# Patient Record
Sex: Female | Born: 1960
Health system: Southern US, Community
[De-identification: ages and names within clinical notes are randomized; demographics above are authoritative.]

## PROBLEM LIST (undated history)

## (undated) DIAGNOSIS — J449 Chronic obstructive pulmonary disease, unspecified: Secondary | ICD-10-CM

## (undated) DIAGNOSIS — E785 Hyperlipidemia, unspecified: Secondary | ICD-10-CM

## (undated) DIAGNOSIS — F419 Anxiety disorder, unspecified: Secondary | ICD-10-CM

## (undated) DIAGNOSIS — K2100 Gastro-esophageal reflux disease with esophagitis, without bleeding: Secondary | ICD-10-CM

## (undated) DIAGNOSIS — M542 Cervicalgia: Secondary | ICD-10-CM

## (undated) DIAGNOSIS — J309 Allergic rhinitis, unspecified: Secondary | ICD-10-CM

## (undated) DIAGNOSIS — R609 Edema, unspecified: Secondary | ICD-10-CM

## (undated) DIAGNOSIS — G8929 Other chronic pain: Secondary | ICD-10-CM

## (undated) DIAGNOSIS — K219 Gastro-esophageal reflux disease without esophagitis: Secondary | ICD-10-CM

## (undated) DIAGNOSIS — G4730 Sleep apnea, unspecified: Secondary | ICD-10-CM

## (undated) DIAGNOSIS — I10 Essential (primary) hypertension: Secondary | ICD-10-CM

## (undated) DIAGNOSIS — M810 Age-related osteoporosis without current pathological fracture: Secondary | ICD-10-CM

## (undated) DIAGNOSIS — M5020 Other cervical disc displacement, unspecified cervical region: Secondary | ICD-10-CM

## (undated) DIAGNOSIS — M545 Low back pain, unspecified: Secondary | ICD-10-CM

## (undated) DIAGNOSIS — J209 Acute bronchitis, unspecified: Secondary | ICD-10-CM

## (undated) DIAGNOSIS — J45909 Unspecified asthma, uncomplicated: Secondary | ICD-10-CM

## (undated) DIAGNOSIS — R079 Chest pain, unspecified: Secondary | ICD-10-CM

## (undated) DIAGNOSIS — L719 Rosacea, unspecified: Secondary | ICD-10-CM

## (undated) HISTORY — DX: Chronic obstructive pulmonary disease, unspecified: J44.9

## (undated) HISTORY — PX: TUBAL LIGATION: SHX77

## (undated) HISTORY — DX: Hyperlipidemia, unspecified: E78.5

## (undated) HISTORY — PX: ABDOMINAL HYSTERECTOMY: SHX81

## (undated) HISTORY — DX: Chest pain, unspecified: R07.9

## (undated) HISTORY — DX: Anxiety disorder, unspecified: F41.9

## (undated) HISTORY — PX: MOUTH SURGERY: SHX715

## (undated) HISTORY — DX: Acute bronchitis, unspecified: J20.9

## (undated) HISTORY — DX: Allergic rhinitis, unspecified: J30.9

## (undated) HISTORY — DX: Cervicalgia: M54.2

## (undated) HISTORY — DX: Unspecified asthma, uncomplicated: J45.909

## (undated) HISTORY — PX: CHOLECYSTECTOMY: SHX55

## (undated) HISTORY — DX: Other chronic pain: G89.29

## (undated) HISTORY — DX: Age-related osteoporosis without current pathological fracture: M81.0

## (undated) HISTORY — PX: EYE SURGERY: SHX253

## (undated) HISTORY — DX: Essential (primary) hypertension: I10

## (undated) HISTORY — DX: Rosacea, unspecified: L71.9

## (undated) HISTORY — DX: Gastro-esophageal reflux disease with esophagitis, without bleeding: K21.00

## (undated) HISTORY — DX: Low back pain, unspecified: M54.50

## (undated) HISTORY — DX: Sleep apnea, unspecified: G47.30

## (undated) HISTORY — DX: Edema, unspecified: R60.9

## (undated) HISTORY — DX: Other cervical disc displacement, unspecified cervical region: M50.20

---
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
## 2012-12-05 HISTORY — PX: COLONOSCOPY: SHX174

## 2013-01-02 ENCOUNTER — Emergency Department (HOSPITAL_COMMUNITY)
Admission: EM | Admit: 2013-01-02 | Discharge: 2013-01-02 | Disposition: A | Discharge status: Home / Self Care | Payer: BC Managed Care – PPO | Attending: Emergency Medicine | Admitting: Emergency Medicine

## 2013-01-02 ENCOUNTER — Encounter (HOSPITAL_COMMUNITY): Payer: Self-pay | Admitting: Emergency Medicine

## 2013-01-02 ENCOUNTER — Emergency Department (HOSPITAL_COMMUNITY): Payer: BC Managed Care – PPO

## 2013-01-02 DIAGNOSIS — J069 Acute upper respiratory infection, unspecified: Secondary | ICD-10-CM

## 2013-01-02 DIAGNOSIS — E669 Obesity, unspecified: Secondary | ICD-10-CM | POA: Insufficient documentation

## 2013-01-02 DIAGNOSIS — Z87891 Personal history of nicotine dependence: Secondary | ICD-10-CM | POA: Insufficient documentation

## 2013-01-02 DIAGNOSIS — IMO0002 Reserved for concepts with insufficient information to code with codable children: Secondary | ICD-10-CM | POA: Insufficient documentation

## 2013-01-02 DIAGNOSIS — Z792 Long term (current) use of antibiotics: Secondary | ICD-10-CM | POA: Insufficient documentation

## 2013-01-02 DIAGNOSIS — Z79899 Other long term (current) drug therapy: Secondary | ICD-10-CM | POA: Insufficient documentation

## 2013-01-02 MED ORDER — IPRATROPIUM BROMIDE 0.02 % IN SOLN
0.5000 mg | Freq: Once | RESPIRATORY_TRACT | Status: AC
  Administered 2013-01-02: 0.5 mg via RESPIRATORY_TRACT
  Filled 2013-01-02: qty 2.5

## 2013-01-02 MED ORDER — HYDROCOD POLST-CHLORPHEN POLST 10-8 MG/5ML PO LQCR: 5.0000 mL | Freq: Two times a day (BID) | ORAL | Status: DC | PRN

## 2013-01-02 MED ORDER — ALBUTEROL SULFATE (2.5 MG/3ML) 0.083% IN NEBU
5.0000 mg | INHALATION_SOLUTION | Freq: Once | RESPIRATORY_TRACT | Status: AC
  Administered 2013-01-02: 5 mg via RESPIRATORY_TRACT
  Filled 2013-01-02: qty 6

## 2013-01-02 MED ORDER — ACETAMINOPHEN 325 MG PO TABS
ORAL_TABLET | ORAL | Status: AC
  Administered 2013-01-02: 650 mg via ORAL
  Filled 2013-01-02: qty 2

## 2013-01-02 MED ORDER — ACETAMINOPHEN 325 MG PO TABS
650.0000 mg | ORAL_TABLET | Freq: Once | ORAL | Status: AC
  Administered 2013-01-02: 650 mg via ORAL

## 2013-01-02 MED ORDER — PREDNISONE 20 MG PO TABS: ORAL_TABLET | ORAL | Status: DC

## 2013-01-02 MED ORDER — PREDNISONE 50 MG PO TABS
60.0000 mg | ORAL_TABLET | Freq: Once | ORAL | Status: AC
  Administered 2013-01-02: 60 mg via ORAL
  Filled 2013-01-02 (×2): qty 1

## 2013-01-02 NOTE — ED Notes (Signed)
Patient given discharge instruction, verbalized understand. Patient ambulatory out of the department.  

## 2013-01-02 NOTE — ED Provider Notes (Signed)
CSN: [id number]     Arrival date & time 01/02/13  1828 History   First MD Initiated Contact with Patient 01/02/13 1842     Chief Complaint  Patient presents with  . Shortness of Breath   (Consider location/radiation/quality/duration/timing/severity/associated sxs/prior Treatment) HPI.... cough and wheezing for several days along with fever.  Patient seen by primary care doctor and prescribed Cipro and albuterol inhaler. She claims to be no better. Severity is mild to moderate. Nothing makes symptoms better or worse  History reviewed. No pertinent past medical history. Past Surgical History  Procedure Laterality Date  . Abdominal hysterectomy    . Tubal ligation    . Cholecystectomy    . Mouth surgery     No family history on file. History  Substance Use Topics  . Smoking status: Former Games developer  . Smokeless tobacco: Not on file  . Alcohol Use: No   OB History   Grav Para Term Preterm Abortions TAB SAB Ect Mult Living                 Review of Systems  All other systems reviewed and are negative.    Allergies  Review of patient's allergies indicates no known allergies.  Home Medications   Current Outpatient Rx  Name  Route  Sig  Dispense  Refill  . albuterol (PROVENTIL) (2.5 MG/3ML) 0.083% nebulizer solution   Inhalation   Inhale 3 mLs into the lungs every 6 (six) hours as needed. For shortness of breath         . alendronate (FOSAMAX) 70 MG tablet   Oral   Take 1 tablet by mouth once a week.         . Ascorbic Acid (VITAMIN C PO)   Oral   Take 1 tablet by mouth daily.         . ciprofloxacin (CIPRO) 500 MG tablet   Oral   Take 500 mg by mouth 2 (two) times daily. 7 day course starting on 01/01/2013         . Cyanocobalamin (B-12 PO)   Oral   Take 1 tablet by mouth daily.         . cyclobenzaprine (FLEXERIL) 10 MG tablet   Oral   Take 10 mg by mouth 3 (three) times daily as needed. For muscle spasms         . fluticasone (FLONASE) 50 MCG/ACT  nasal spray   Each Nare   Place 2 sprays into both nostrils.         Marland Kitchen HYDROcodone-acetaminophen (NORCO/VICODIN) 5-325 MG per tablet   Oral   Take 1 tablet by mouth every 6 (six) hours as needed. For pain         . HYDROcodone-homatropine (HYCODAN) 5-1.5 MG/5ML syrup   Oral   Take 5 mLs by mouth every 6 (six) hours as needed. For cough and/or pain         . LORazepam (ATIVAN) 1 MG tablet   Oral   Take 1 mg by mouth 2 (two) times daily as needed. For anxiety         . MAGNESIUM CARBONATE PO   Oral   Take 1 tablet by mouth daily.         . Multiple Vitamin (MULTIVITAMIN WITH MINERALS) TABS tablet   Oral   Take 1 tablet by mouth daily.         . traMADol (ULTRAM) 50 MG tablet   Oral   Take 50-100 mg by mouth  every 6 (six) hours as needed.         . chlorpheniramine-HYDROcodone (TUSSIONEX PENNKINETIC ER) 10-8 MG/5ML LQCR   Oral   Take 5 mLs by mouth every 12 (twelve) hours as needed for cough.   120 mL   0   . predniSONE (DELTASONE) 20 MG tablet      3 tabs po day one, then 2 po daily x 4 days   11 tablet   0    BP 153/73  Pulse 97  Temp(Src) 101.1 F (38.4 C) (Oral)  Resp 20  Ht 5\' 1"  (1.549 m)  Wt 303 lb (137.44 kg)  BMI 57.28 kg/m2  SpO2 99% Physical Exam  Nursing note and vitals reviewed. Constitutional: She is oriented to person, place, and time.  Febrile, obese  HENT:  Head: Normocephalic and atraumatic.  Eyes: Conjunctivae and EOM are normal. Pupils are equal, round, and reactive to light.  Neck: Normal range of motion. Neck supple.  Cardiovascular: Normal rate, regular rhythm and normal heart sounds.   Pulmonary/Chest: Effort normal.  Minimal expiratory wheeze  Abdominal: Soft. Bowel sounds are normal.  Musculoskeletal: Normal range of motion.  Neurological: She is alert and oriented to person, place, and time.  Skin: Skin is warm and dry.  Psychiatric: She has a normal mood and affect. Her behavior is normal.    ED Course   Procedures (including critical care time) Labs Review Labs Reviewed - No data to display Imaging Review Dg Chest 2 View  01/02/2013   CLINICAL DATA:  Cough, congestion and fever  EXAM: CHEST  2 VIEW  COMPARISON:  None.  FINDINGS: The heart size and mediastinal contours are within normal limits. Both lungs are clear. The visualized skeletal structures are unremarkable.  IMPRESSION: No active cardiopulmonary disease.   Electronically Signed   By: Elige Ko   On: 01/02/2013 19:22    EKG Interpretation   None       MDM   1. URI (upper respiratory infection)    Patient is in no acute distress. Chest x-ray shows no pneumonia. Most likely this is a viral illness.  Rx prednisone and Tussionex cough syrup    Donnetta Hutching, MD 01/02/13 2105

## 2013-01-02 NOTE — ED Notes (Addendum)
Pt. Reports SOB starting yesterday. Pt. Reports increased SOB today. Pt. Reports cough and nasal congestion. Pt. In no acute distress.

## 2013-01-02 NOTE — ED Notes (Signed)
Patient states that head and cheat hurts due to coughing.

## 2015-08-13 DIAGNOSIS — Z7189 Other specified counseling: Secondary | ICD-10-CM | POA: Diagnosis not present

## 2015-08-13 DIAGNOSIS — Z0000 Encounter for general adult medical examination without abnormal findings: Secondary | ICD-10-CM | POA: Diagnosis not present

## 2015-08-13 DIAGNOSIS — Z1211 Encounter for screening for malignant neoplasm of colon: Secondary | ICD-10-CM | POA: Diagnosis not present

## 2015-08-13 DIAGNOSIS — Z1389 Encounter for screening for other disorder: Secondary | ICD-10-CM | POA: Diagnosis not present

## 2015-08-13 DIAGNOSIS — E7800 Pure hypercholesterolemia, unspecified: Secondary | ICD-10-CM | POA: Diagnosis not present

## 2015-08-13 DIAGNOSIS — Z79899 Other long term (current) drug therapy: Secondary | ICD-10-CM | POA: Diagnosis not present

## 2015-08-13 DIAGNOSIS — R5383 Other fatigue: Secondary | ICD-10-CM | POA: Diagnosis not present

## 2015-08-13 DIAGNOSIS — Z299 Encounter for prophylactic measures, unspecified: Secondary | ICD-10-CM | POA: Diagnosis not present

## 2015-09-26 DIAGNOSIS — M549 Dorsalgia, unspecified: Secondary | ICD-10-CM | POA: Diagnosis not present

## 2015-09-26 DIAGNOSIS — Z6841 Body Mass Index (BMI) 40.0 and over, adult: Secondary | ICD-10-CM | POA: Diagnosis not present

## 2015-09-26 DIAGNOSIS — J45909 Unspecified asthma, uncomplicated: Secondary | ICD-10-CM | POA: Diagnosis not present

## 2015-10-27 DIAGNOSIS — M549 Dorsalgia, unspecified: Secondary | ICD-10-CM | POA: Diagnosis not present

## 2015-10-27 DIAGNOSIS — Z299 Encounter for prophylactic measures, unspecified: Secondary | ICD-10-CM | POA: Diagnosis not present

## 2015-10-27 DIAGNOSIS — Z6841 Body Mass Index (BMI) 40.0 and over, adult: Secondary | ICD-10-CM | POA: Diagnosis not present

## 2015-10-27 DIAGNOSIS — J439 Emphysema, unspecified: Secondary | ICD-10-CM | POA: Diagnosis not present

## 2015-10-27 DIAGNOSIS — J441 Chronic obstructive pulmonary disease with (acute) exacerbation: Secondary | ICD-10-CM | POA: Diagnosis not present

## 2015-11-24 DIAGNOSIS — G4733 Obstructive sleep apnea (adult) (pediatric): Secondary | ICD-10-CM | POA: Diagnosis not present

## 2015-11-24 DIAGNOSIS — H40053 Ocular hypertension, bilateral: Secondary | ICD-10-CM | POA: Diagnosis not present

## 2015-12-01 DIAGNOSIS — G4733 Obstructive sleep apnea (adult) (pediatric): Secondary | ICD-10-CM | POA: Diagnosis not present

## 2015-12-01 DIAGNOSIS — Z299 Encounter for prophylactic measures, unspecified: Secondary | ICD-10-CM | POA: Diagnosis not present

## 2015-12-01 DIAGNOSIS — Z713 Dietary counseling and surveillance: Secondary | ICD-10-CM | POA: Diagnosis not present

## 2015-12-01 DIAGNOSIS — Z6841 Body Mass Index (BMI) 40.0 and over, adult: Secondary | ICD-10-CM | POA: Diagnosis not present

## 2015-12-01 DIAGNOSIS — M2550 Pain in unspecified joint: Secondary | ICD-10-CM | POA: Diagnosis not present

## 2015-12-01 DIAGNOSIS — J439 Emphysema, unspecified: Secondary | ICD-10-CM | POA: Diagnosis not present

## 2015-12-30 DIAGNOSIS — J439 Emphysema, unspecified: Secondary | ICD-10-CM | POA: Diagnosis not present

## 2015-12-30 DIAGNOSIS — Z6841 Body Mass Index (BMI) 40.0 and over, adult: Secondary | ICD-10-CM | POA: Diagnosis not present

## 2015-12-30 DIAGNOSIS — M549 Dorsalgia, unspecified: Secondary | ICD-10-CM | POA: Diagnosis not present

## 2015-12-30 DIAGNOSIS — Z299 Encounter for prophylactic measures, unspecified: Secondary | ICD-10-CM | POA: Diagnosis not present

## 2015-12-30 DIAGNOSIS — Z789 Other specified health status: Secondary | ICD-10-CM | POA: Diagnosis not present

## 2016-01-23 DIAGNOSIS — R509 Fever, unspecified: Secondary | ICD-10-CM | POA: Diagnosis not present

## 2016-01-23 DIAGNOSIS — J441 Chronic obstructive pulmonary disease with (acute) exacerbation: Secondary | ICD-10-CM | POA: Diagnosis not present

## 2016-01-23 DIAGNOSIS — R110 Nausea: Secondary | ICD-10-CM | POA: Diagnosis not present

## 2016-02-03 DIAGNOSIS — M549 Dorsalgia, unspecified: Secondary | ICD-10-CM | POA: Diagnosis not present

## 2016-02-03 DIAGNOSIS — J439 Emphysema, unspecified: Secondary | ICD-10-CM | POA: Diagnosis not present

## 2016-02-03 DIAGNOSIS — Z713 Dietary counseling and surveillance: Secondary | ICD-10-CM | POA: Diagnosis not present

## 2016-02-03 DIAGNOSIS — Z79899 Other long term (current) drug therapy: Secondary | ICD-10-CM | POA: Diagnosis not present

## 2016-02-03 DIAGNOSIS — J440 Chronic obstructive pulmonary disease with acute lower respiratory infection: Secondary | ICD-10-CM | POA: Diagnosis not present

## 2016-02-03 DIAGNOSIS — J209 Acute bronchitis, unspecified: Secondary | ICD-10-CM | POA: Diagnosis not present

## 2016-02-03 DIAGNOSIS — Z299 Encounter for prophylactic measures, unspecified: Secondary | ICD-10-CM | POA: Diagnosis not present

## 2016-02-03 DIAGNOSIS — Z87891 Personal history of nicotine dependence: Secondary | ICD-10-CM | POA: Diagnosis not present

## 2016-02-03 DIAGNOSIS — Z6841 Body Mass Index (BMI) 40.0 and over, adult: Secondary | ICD-10-CM | POA: Diagnosis not present

## 2016-03-03 DIAGNOSIS — G4733 Obstructive sleep apnea (adult) (pediatric): Secondary | ICD-10-CM | POA: Diagnosis not present

## 2016-03-08 DIAGNOSIS — G4733 Obstructive sleep apnea (adult) (pediatric): Secondary | ICD-10-CM | POA: Diagnosis not present

## 2016-03-11 DIAGNOSIS — Z713 Dietary counseling and surveillance: Secondary | ICD-10-CM | POA: Diagnosis not present

## 2016-03-11 DIAGNOSIS — Z79899 Other long term (current) drug therapy: Secondary | ICD-10-CM | POA: Diagnosis not present

## 2016-03-11 DIAGNOSIS — J441 Chronic obstructive pulmonary disease with (acute) exacerbation: Secondary | ICD-10-CM | POA: Diagnosis not present

## 2016-03-11 DIAGNOSIS — J439 Emphysema, unspecified: Secondary | ICD-10-CM | POA: Diagnosis not present

## 2016-03-11 DIAGNOSIS — M549 Dorsalgia, unspecified: Secondary | ICD-10-CM | POA: Diagnosis not present

## 2016-03-11 DIAGNOSIS — Z87891 Personal history of nicotine dependence: Secondary | ICD-10-CM | POA: Diagnosis not present

## 2016-03-11 DIAGNOSIS — Z299 Encounter for prophylactic measures, unspecified: Secondary | ICD-10-CM | POA: Diagnosis not present

## 2016-03-11 DIAGNOSIS — Z6841 Body Mass Index (BMI) 40.0 and over, adult: Secondary | ICD-10-CM | POA: Diagnosis not present

## 2016-03-24 DIAGNOSIS — H40053 Ocular hypertension, bilateral: Secondary | ICD-10-CM | POA: Diagnosis not present

## 2016-04-08 DIAGNOSIS — G4733 Obstructive sleep apnea (adult) (pediatric): Secondary | ICD-10-CM | POA: Diagnosis not present

## 2016-04-13 DIAGNOSIS — I8390 Asymptomatic varicose veins of unspecified lower extremity: Secondary | ICD-10-CM | POA: Diagnosis not present

## 2016-04-13 DIAGNOSIS — Z79899 Other long term (current) drug therapy: Secondary | ICD-10-CM | POA: Diagnosis not present

## 2016-04-13 DIAGNOSIS — F419 Anxiety disorder, unspecified: Secondary | ICD-10-CM | POA: Diagnosis not present

## 2016-04-13 DIAGNOSIS — J441 Chronic obstructive pulmonary disease with (acute) exacerbation: Secondary | ICD-10-CM | POA: Diagnosis not present

## 2016-04-13 DIAGNOSIS — K210 Gastro-esophageal reflux disease with esophagitis: Secondary | ICD-10-CM | POA: Diagnosis not present

## 2016-04-13 DIAGNOSIS — Z6841 Body Mass Index (BMI) 40.0 and over, adult: Secondary | ICD-10-CM | POA: Diagnosis not present

## 2016-04-13 DIAGNOSIS — F329 Major depressive disorder, single episode, unspecified: Secondary | ICD-10-CM | POA: Diagnosis not present

## 2016-04-13 DIAGNOSIS — Z1389 Encounter for screening for other disorder: Secondary | ICD-10-CM | POA: Diagnosis not present

## 2016-04-13 DIAGNOSIS — Z299 Encounter for prophylactic measures, unspecified: Secondary | ICD-10-CM | POA: Diagnosis not present

## 2016-04-13 DIAGNOSIS — M549 Dorsalgia, unspecified: Secondary | ICD-10-CM | POA: Diagnosis not present

## 2016-04-13 DIAGNOSIS — G4730 Sleep apnea, unspecified: Secondary | ICD-10-CM | POA: Diagnosis not present

## 2016-04-30 DIAGNOSIS — G4733 Obstructive sleep apnea (adult) (pediatric): Secondary | ICD-10-CM | POA: Diagnosis not present

## 2016-05-08 DIAGNOSIS — G4733 Obstructive sleep apnea (adult) (pediatric): Secondary | ICD-10-CM | POA: Diagnosis not present

## 2016-05-12 DIAGNOSIS — F419 Anxiety disorder, unspecified: Secondary | ICD-10-CM | POA: Diagnosis not present

## 2016-05-12 DIAGNOSIS — Z713 Dietary counseling and surveillance: Secondary | ICD-10-CM | POA: Diagnosis not present

## 2016-05-12 DIAGNOSIS — Z79899 Other long term (current) drug therapy: Secondary | ICD-10-CM | POA: Diagnosis not present

## 2016-05-12 DIAGNOSIS — J439 Emphysema, unspecified: Secondary | ICD-10-CM | POA: Diagnosis not present

## 2016-05-12 DIAGNOSIS — M549 Dorsalgia, unspecified: Secondary | ICD-10-CM | POA: Diagnosis not present

## 2016-05-12 DIAGNOSIS — Z299 Encounter for prophylactic measures, unspecified: Secondary | ICD-10-CM | POA: Diagnosis not present

## 2016-05-12 DIAGNOSIS — Z6841 Body Mass Index (BMI) 40.0 and over, adult: Secondary | ICD-10-CM | POA: Diagnosis not present

## 2016-05-12 DIAGNOSIS — E7800 Pure hypercholesterolemia, unspecified: Secondary | ICD-10-CM | POA: Diagnosis not present

## 2016-05-27 DIAGNOSIS — L259 Unspecified contact dermatitis, unspecified cause: Secondary | ICD-10-CM | POA: Diagnosis not present

## 2016-05-27 DIAGNOSIS — J439 Emphysema, unspecified: Secondary | ICD-10-CM | POA: Diagnosis not present

## 2016-05-27 DIAGNOSIS — Z299 Encounter for prophylactic measures, unspecified: Secondary | ICD-10-CM | POA: Diagnosis not present

## 2016-05-27 DIAGNOSIS — Z6841 Body Mass Index (BMI) 40.0 and over, adult: Secondary | ICD-10-CM | POA: Diagnosis not present

## 2016-05-27 DIAGNOSIS — Z87891 Personal history of nicotine dependence: Secondary | ICD-10-CM | POA: Diagnosis not present

## 2016-06-08 DIAGNOSIS — G4733 Obstructive sleep apnea (adult) (pediatric): Secondary | ICD-10-CM | POA: Diagnosis not present

## 2016-06-14 DIAGNOSIS — E7800 Pure hypercholesterolemia, unspecified: Secondary | ICD-10-CM | POA: Diagnosis not present

## 2016-06-14 DIAGNOSIS — Z79899 Other long term (current) drug therapy: Secondary | ICD-10-CM | POA: Diagnosis not present

## 2016-06-14 DIAGNOSIS — M549 Dorsalgia, unspecified: Secondary | ICD-10-CM | POA: Diagnosis not present

## 2016-06-14 DIAGNOSIS — G4730 Sleep apnea, unspecified: Secondary | ICD-10-CM | POA: Diagnosis not present

## 2016-06-14 DIAGNOSIS — F419 Anxiety disorder, unspecified: Secondary | ICD-10-CM | POA: Diagnosis not present

## 2016-06-14 DIAGNOSIS — I8390 Asymptomatic varicose veins of unspecified lower extremity: Secondary | ICD-10-CM | POA: Diagnosis not present

## 2016-06-14 DIAGNOSIS — Z299 Encounter for prophylactic measures, unspecified: Secondary | ICD-10-CM | POA: Diagnosis not present

## 2016-06-14 DIAGNOSIS — J439 Emphysema, unspecified: Secondary | ICD-10-CM | POA: Diagnosis not present

## 2016-06-14 DIAGNOSIS — Z6841 Body Mass Index (BMI) 40.0 and over, adult: Secondary | ICD-10-CM | POA: Diagnosis not present

## 2016-07-08 DIAGNOSIS — G4733 Obstructive sleep apnea (adult) (pediatric): Secondary | ICD-10-CM | POA: Diagnosis not present

## 2016-07-12 DIAGNOSIS — I8390 Asymptomatic varicose veins of unspecified lower extremity: Secondary | ICD-10-CM | POA: Diagnosis not present

## 2016-07-12 DIAGNOSIS — E7800 Pure hypercholesterolemia, unspecified: Secondary | ICD-10-CM | POA: Diagnosis not present

## 2016-07-12 DIAGNOSIS — Z79899 Other long term (current) drug therapy: Secondary | ICD-10-CM | POA: Diagnosis not present

## 2016-07-12 DIAGNOSIS — M2550 Pain in unspecified joint: Secondary | ICD-10-CM | POA: Diagnosis not present

## 2016-07-12 DIAGNOSIS — F329 Major depressive disorder, single episode, unspecified: Secondary | ICD-10-CM | POA: Diagnosis not present

## 2016-07-12 DIAGNOSIS — J439 Emphysema, unspecified: Secondary | ICD-10-CM | POA: Diagnosis not present

## 2016-07-12 DIAGNOSIS — Z299 Encounter for prophylactic measures, unspecified: Secondary | ICD-10-CM | POA: Diagnosis not present

## 2016-07-12 DIAGNOSIS — F419 Anxiety disorder, unspecified: Secondary | ICD-10-CM | POA: Diagnosis not present

## 2016-07-12 DIAGNOSIS — Z6841 Body Mass Index (BMI) 40.0 and over, adult: Secondary | ICD-10-CM | POA: Diagnosis not present

## 2016-07-12 DIAGNOSIS — G4730 Sleep apnea, unspecified: Secondary | ICD-10-CM | POA: Diagnosis not present

## 2016-07-14 DIAGNOSIS — M7731 Calcaneal spur, right foot: Secondary | ICD-10-CM | POA: Diagnosis not present

## 2016-07-14 DIAGNOSIS — S99921A Unspecified injury of right foot, initial encounter: Secondary | ICD-10-CM | POA: Diagnosis not present

## 2016-07-14 DIAGNOSIS — M79671 Pain in right foot: Secondary | ICD-10-CM | POA: Diagnosis not present

## 2016-07-14 DIAGNOSIS — M7751 Other enthesopathy of right foot: Secondary | ICD-10-CM | POA: Diagnosis not present

## 2016-08-08 DIAGNOSIS — G4733 Obstructive sleep apnea (adult) (pediatric): Secondary | ICD-10-CM | POA: Diagnosis not present

## 2016-08-12 DIAGNOSIS — Z1231 Encounter for screening mammogram for malignant neoplasm of breast: Secondary | ICD-10-CM | POA: Diagnosis not present

## 2016-08-13 DIAGNOSIS — Z7189 Other specified counseling: Secondary | ICD-10-CM | POA: Diagnosis not present

## 2016-08-13 DIAGNOSIS — M545 Low back pain: Secondary | ICD-10-CM | POA: Diagnosis not present

## 2016-08-13 DIAGNOSIS — G4730 Sleep apnea, unspecified: Secondary | ICD-10-CM | POA: Diagnosis not present

## 2016-08-13 DIAGNOSIS — R5383 Other fatigue: Secondary | ICD-10-CM | POA: Diagnosis not present

## 2016-08-13 DIAGNOSIS — F329 Major depressive disorder, single episode, unspecified: Secondary | ICD-10-CM | POA: Diagnosis not present

## 2016-08-13 DIAGNOSIS — F419 Anxiety disorder, unspecified: Secondary | ICD-10-CM | POA: Diagnosis not present

## 2016-08-13 DIAGNOSIS — Z0000 Encounter for general adult medical examination without abnormal findings: Secondary | ICD-10-CM | POA: Diagnosis not present

## 2016-08-13 DIAGNOSIS — I8390 Asymptomatic varicose veins of unspecified lower extremity: Secondary | ICD-10-CM | POA: Diagnosis not present

## 2016-08-13 DIAGNOSIS — Z299 Encounter for prophylactic measures, unspecified: Secondary | ICD-10-CM | POA: Diagnosis not present

## 2016-08-13 DIAGNOSIS — Z1211 Encounter for screening for malignant neoplasm of colon: Secondary | ICD-10-CM | POA: Diagnosis not present

## 2016-08-13 DIAGNOSIS — Z79899 Other long term (current) drug therapy: Secondary | ICD-10-CM | POA: Diagnosis not present

## 2016-08-13 DIAGNOSIS — Z6841 Body Mass Index (BMI) 40.0 and over, adult: Secondary | ICD-10-CM | POA: Diagnosis not present

## 2016-08-13 DIAGNOSIS — Z1389 Encounter for screening for other disorder: Secondary | ICD-10-CM | POA: Diagnosis not present

## 2016-08-13 DIAGNOSIS — E7800 Pure hypercholesterolemia, unspecified: Secondary | ICD-10-CM | POA: Diagnosis not present

## 2016-09-08 DIAGNOSIS — G4733 Obstructive sleep apnea (adult) (pediatric): Secondary | ICD-10-CM | POA: Diagnosis not present

## 2016-09-09 DIAGNOSIS — E2839 Other primary ovarian failure: Secondary | ICD-10-CM | POA: Diagnosis not present

## 2016-09-10 DIAGNOSIS — J439 Emphysema, unspecified: Secondary | ICD-10-CM | POA: Diagnosis not present

## 2016-09-10 DIAGNOSIS — Z79899 Other long term (current) drug therapy: Secondary | ICD-10-CM | POA: Diagnosis not present

## 2016-09-10 DIAGNOSIS — F329 Major depressive disorder, single episode, unspecified: Secondary | ICD-10-CM | POA: Diagnosis not present

## 2016-09-10 DIAGNOSIS — Z6841 Body Mass Index (BMI) 40.0 and over, adult: Secondary | ICD-10-CM | POA: Diagnosis not present

## 2016-09-10 DIAGNOSIS — F419 Anxiety disorder, unspecified: Secondary | ICD-10-CM | POA: Diagnosis not present

## 2016-09-10 DIAGNOSIS — Z299 Encounter for prophylactic measures, unspecified: Secondary | ICD-10-CM | POA: Diagnosis not present

## 2016-09-10 DIAGNOSIS — M79609 Pain in unspecified limb: Secondary | ICD-10-CM | POA: Diagnosis not present

## 2016-09-10 DIAGNOSIS — E7800 Pure hypercholesterolemia, unspecified: Secondary | ICD-10-CM | POA: Diagnosis not present

## 2016-09-10 DIAGNOSIS — G4730 Sleep apnea, unspecified: Secondary | ICD-10-CM | POA: Diagnosis not present

## 2016-09-10 DIAGNOSIS — Z87891 Personal history of nicotine dependence: Secondary | ICD-10-CM | POA: Diagnosis not present

## 2016-09-10 DIAGNOSIS — M2550 Pain in unspecified joint: Secondary | ICD-10-CM | POA: Diagnosis not present

## 2016-09-28 DIAGNOSIS — H40053 Ocular hypertension, bilateral: Secondary | ICD-10-CM | POA: Diagnosis not present

## 2016-10-08 DIAGNOSIS — G4733 Obstructive sleep apnea (adult) (pediatric): Secondary | ICD-10-CM | POA: Diagnosis not present

## 2016-10-11 DIAGNOSIS — J439 Emphysema, unspecified: Secondary | ICD-10-CM | POA: Diagnosis not present

## 2016-10-11 DIAGNOSIS — M549 Dorsalgia, unspecified: Secondary | ICD-10-CM | POA: Diagnosis not present

## 2016-10-11 DIAGNOSIS — J440 Chronic obstructive pulmonary disease with acute lower respiratory infection: Secondary | ICD-10-CM | POA: Diagnosis not present

## 2016-10-11 DIAGNOSIS — Z299 Encounter for prophylactic measures, unspecified: Secondary | ICD-10-CM | POA: Diagnosis not present

## 2016-10-11 DIAGNOSIS — Z6841 Body Mass Index (BMI) 40.0 and over, adult: Secondary | ICD-10-CM | POA: Diagnosis not present

## 2016-10-11 DIAGNOSIS — J209 Acute bronchitis, unspecified: Secondary | ICD-10-CM | POA: Diagnosis not present

## 2016-10-11 DIAGNOSIS — Z79899 Other long term (current) drug therapy: Secondary | ICD-10-CM | POA: Diagnosis not present

## 2016-10-27 DIAGNOSIS — Z299 Encounter for prophylactic measures, unspecified: Secondary | ICD-10-CM | POA: Diagnosis not present

## 2016-10-27 DIAGNOSIS — J439 Emphysema, unspecified: Secondary | ICD-10-CM | POA: Diagnosis not present

## 2016-10-27 DIAGNOSIS — G4733 Obstructive sleep apnea (adult) (pediatric): Secondary | ICD-10-CM | POA: Diagnosis not present

## 2016-10-27 DIAGNOSIS — G4730 Sleep apnea, unspecified: Secondary | ICD-10-CM | POA: Diagnosis not present

## 2016-10-27 DIAGNOSIS — M549 Dorsalgia, unspecified: Secondary | ICD-10-CM | POA: Diagnosis not present

## 2016-10-27 DIAGNOSIS — I8390 Asymptomatic varicose veins of unspecified lower extremity: Secondary | ICD-10-CM | POA: Diagnosis not present

## 2016-10-27 DIAGNOSIS — E7800 Pure hypercholesterolemia, unspecified: Secondary | ICD-10-CM | POA: Diagnosis not present

## 2016-10-27 DIAGNOSIS — J441 Chronic obstructive pulmonary disease with (acute) exacerbation: Secondary | ICD-10-CM | POA: Diagnosis not present

## 2016-10-27 DIAGNOSIS — Z6841 Body Mass Index (BMI) 40.0 and over, adult: Secondary | ICD-10-CM | POA: Diagnosis not present

## 2016-10-27 DIAGNOSIS — F329 Major depressive disorder, single episode, unspecified: Secondary | ICD-10-CM | POA: Diagnosis not present

## 2016-10-27 DIAGNOSIS — F419 Anxiety disorder, unspecified: Secondary | ICD-10-CM | POA: Diagnosis not present

## 2016-11-08 DIAGNOSIS — G4733 Obstructive sleep apnea (adult) (pediatric): Secondary | ICD-10-CM | POA: Diagnosis not present

## 2016-11-11 DIAGNOSIS — E7800 Pure hypercholesterolemia, unspecified: Secondary | ICD-10-CM | POA: Diagnosis not present

## 2016-11-11 DIAGNOSIS — Z79899 Other long term (current) drug therapy: Secondary | ICD-10-CM | POA: Diagnosis not present

## 2016-11-11 DIAGNOSIS — Z299 Encounter for prophylactic measures, unspecified: Secondary | ICD-10-CM | POA: Diagnosis not present

## 2016-11-11 DIAGNOSIS — M545 Low back pain: Secondary | ICD-10-CM | POA: Diagnosis not present

## 2016-11-11 DIAGNOSIS — B379 Candidiasis, unspecified: Secondary | ICD-10-CM | POA: Diagnosis not present

## 2016-11-11 DIAGNOSIS — J439 Emphysema, unspecified: Secondary | ICD-10-CM | POA: Diagnosis not present

## 2016-11-11 DIAGNOSIS — Z6841 Body Mass Index (BMI) 40.0 and over, adult: Secondary | ICD-10-CM | POA: Diagnosis not present

## 2016-12-08 DIAGNOSIS — G4733 Obstructive sleep apnea (adult) (pediatric): Secondary | ICD-10-CM | POA: Diagnosis not present

## 2016-12-10 DIAGNOSIS — I8390 Asymptomatic varicose veins of unspecified lower extremity: Secondary | ICD-10-CM | POA: Diagnosis not present

## 2016-12-10 DIAGNOSIS — J439 Emphysema, unspecified: Secondary | ICD-10-CM | POA: Diagnosis not present

## 2016-12-10 DIAGNOSIS — F329 Major depressive disorder, single episode, unspecified: Secondary | ICD-10-CM | POA: Diagnosis not present

## 2016-12-10 DIAGNOSIS — Z87891 Personal history of nicotine dependence: Secondary | ICD-10-CM | POA: Diagnosis not present

## 2016-12-10 DIAGNOSIS — Z6841 Body Mass Index (BMI) 40.0 and over, adult: Secondary | ICD-10-CM | POA: Diagnosis not present

## 2016-12-10 DIAGNOSIS — M545 Low back pain: Secondary | ICD-10-CM | POA: Diagnosis not present

## 2016-12-10 DIAGNOSIS — Z79899 Other long term (current) drug therapy: Secondary | ICD-10-CM | POA: Diagnosis not present

## 2016-12-10 DIAGNOSIS — Z299 Encounter for prophylactic measures, unspecified: Secondary | ICD-10-CM | POA: Diagnosis not present

## 2016-12-10 DIAGNOSIS — G4730 Sleep apnea, unspecified: Secondary | ICD-10-CM | POA: Diagnosis not present

## 2017-01-08 DIAGNOSIS — G4733 Obstructive sleep apnea (adult) (pediatric): Secondary | ICD-10-CM | POA: Diagnosis not present

## 2017-01-10 DIAGNOSIS — M549 Dorsalgia, unspecified: Secondary | ICD-10-CM | POA: Diagnosis not present

## 2017-01-10 DIAGNOSIS — Z87891 Personal history of nicotine dependence: Secondary | ICD-10-CM | POA: Diagnosis not present

## 2017-01-10 DIAGNOSIS — Z6841 Body Mass Index (BMI) 40.0 and over, adult: Secondary | ICD-10-CM | POA: Diagnosis not present

## 2017-01-10 DIAGNOSIS — J441 Chronic obstructive pulmonary disease with (acute) exacerbation: Secondary | ICD-10-CM | POA: Diagnosis not present

## 2017-01-10 DIAGNOSIS — J439 Emphysema, unspecified: Secondary | ICD-10-CM | POA: Diagnosis not present

## 2017-01-10 DIAGNOSIS — Z79899 Other long term (current) drug therapy: Secondary | ICD-10-CM | POA: Diagnosis not present

## 2017-01-10 DIAGNOSIS — Z299 Encounter for prophylactic measures, unspecified: Secondary | ICD-10-CM | POA: Diagnosis not present

## 2017-01-10 DIAGNOSIS — G4730 Sleep apnea, unspecified: Secondary | ICD-10-CM | POA: Diagnosis not present

## 2017-01-10 DIAGNOSIS — I8390 Asymptomatic varicose veins of unspecified lower extremity: Secondary | ICD-10-CM | POA: Diagnosis not present

## 2017-02-10 DIAGNOSIS — I8390 Asymptomatic varicose veins of unspecified lower extremity: Secondary | ICD-10-CM | POA: Diagnosis not present

## 2017-02-10 DIAGNOSIS — Z6841 Body Mass Index (BMI) 40.0 and over, adult: Secondary | ICD-10-CM | POA: Diagnosis not present

## 2017-02-10 DIAGNOSIS — G4730 Sleep apnea, unspecified: Secondary | ICD-10-CM | POA: Diagnosis not present

## 2017-02-10 DIAGNOSIS — E7800 Pure hypercholesterolemia, unspecified: Secondary | ICD-10-CM | POA: Diagnosis not present

## 2017-02-10 DIAGNOSIS — Z79899 Other long term (current) drug therapy: Secondary | ICD-10-CM | POA: Diagnosis not present

## 2017-02-10 DIAGNOSIS — M549 Dorsalgia, unspecified: Secondary | ICD-10-CM | POA: Diagnosis not present

## 2017-02-10 DIAGNOSIS — G4733 Obstructive sleep apnea (adult) (pediatric): Secondary | ICD-10-CM | POA: Diagnosis not present

## 2017-02-10 DIAGNOSIS — Z299 Encounter for prophylactic measures, unspecified: Secondary | ICD-10-CM | POA: Diagnosis not present

## 2017-02-10 DIAGNOSIS — J439 Emphysema, unspecified: Secondary | ICD-10-CM | POA: Diagnosis not present

## 2017-03-08 DIAGNOSIS — G4733 Obstructive sleep apnea (adult) (pediatric): Secondary | ICD-10-CM | POA: Diagnosis not present

## 2017-03-10 DIAGNOSIS — G4730 Sleep apnea, unspecified: Secondary | ICD-10-CM | POA: Diagnosis not present

## 2017-03-10 DIAGNOSIS — J439 Emphysema, unspecified: Secondary | ICD-10-CM | POA: Diagnosis not present

## 2017-03-10 DIAGNOSIS — F419 Anxiety disorder, unspecified: Secondary | ICD-10-CM | POA: Diagnosis not present

## 2017-03-10 DIAGNOSIS — I8390 Asymptomatic varicose veins of unspecified lower extremity: Secondary | ICD-10-CM | POA: Diagnosis not present

## 2017-03-10 DIAGNOSIS — M549 Dorsalgia, unspecified: Secondary | ICD-10-CM | POA: Diagnosis not present

## 2017-03-10 DIAGNOSIS — Z79899 Other long term (current) drug therapy: Secondary | ICD-10-CM | POA: Diagnosis not present

## 2017-03-10 DIAGNOSIS — E7800 Pure hypercholesterolemia, unspecified: Secondary | ICD-10-CM | POA: Diagnosis not present

## 2017-03-10 DIAGNOSIS — Z299 Encounter for prophylactic measures, unspecified: Secondary | ICD-10-CM | POA: Diagnosis not present

## 2017-03-11 DIAGNOSIS — G4733 Obstructive sleep apnea (adult) (pediatric): Secondary | ICD-10-CM | POA: Diagnosis not present

## 2017-04-08 DIAGNOSIS — H40053 Ocular hypertension, bilateral: Secondary | ICD-10-CM | POA: Diagnosis not present

## 2017-04-11 DIAGNOSIS — Z79899 Other long term (current) drug therapy: Secondary | ICD-10-CM | POA: Diagnosis not present

## 2017-04-11 DIAGNOSIS — Z6841 Body Mass Index (BMI) 40.0 and over, adult: Secondary | ICD-10-CM | POA: Diagnosis not present

## 2017-04-11 DIAGNOSIS — G4730 Sleep apnea, unspecified: Secondary | ICD-10-CM | POA: Diagnosis not present

## 2017-04-11 DIAGNOSIS — Z299 Encounter for prophylactic measures, unspecified: Secondary | ICD-10-CM | POA: Diagnosis not present

## 2017-04-11 DIAGNOSIS — Z87891 Personal history of nicotine dependence: Secondary | ICD-10-CM | POA: Diagnosis not present

## 2017-04-11 DIAGNOSIS — M545 Low back pain: Secondary | ICD-10-CM | POA: Diagnosis not present

## 2017-04-11 DIAGNOSIS — J439 Emphysema, unspecified: Secondary | ICD-10-CM | POA: Diagnosis not present

## 2017-04-29 DIAGNOSIS — Z6841 Body Mass Index (BMI) 40.0 and over, adult: Secondary | ICD-10-CM | POA: Diagnosis not present

## 2017-04-29 DIAGNOSIS — Z299 Encounter for prophylactic measures, unspecified: Secondary | ICD-10-CM | POA: Diagnosis not present

## 2017-04-29 DIAGNOSIS — J439 Emphysema, unspecified: Secondary | ICD-10-CM | POA: Diagnosis not present

## 2017-04-29 DIAGNOSIS — J441 Chronic obstructive pulmonary disease with (acute) exacerbation: Secondary | ICD-10-CM | POA: Diagnosis not present

## 2017-04-29 DIAGNOSIS — G4730 Sleep apnea, unspecified: Secondary | ICD-10-CM | POA: Diagnosis not present

## 2017-04-29 DIAGNOSIS — M79609 Pain in unspecified limb: Secondary | ICD-10-CM | POA: Diagnosis not present

## 2017-05-12 DIAGNOSIS — J439 Emphysema, unspecified: Secondary | ICD-10-CM | POA: Diagnosis not present

## 2017-05-12 DIAGNOSIS — Z79899 Other long term (current) drug therapy: Secondary | ICD-10-CM | POA: Diagnosis not present

## 2017-05-12 DIAGNOSIS — M549 Dorsalgia, unspecified: Secondary | ICD-10-CM | POA: Diagnosis not present

## 2017-05-12 DIAGNOSIS — Z299 Encounter for prophylactic measures, unspecified: Secondary | ICD-10-CM | POA: Diagnosis not present

## 2017-06-08 DIAGNOSIS — G4733 Obstructive sleep apnea (adult) (pediatric): Secondary | ICD-10-CM | POA: Diagnosis not present

## 2017-06-10 DIAGNOSIS — F419 Anxiety disorder, unspecified: Secondary | ICD-10-CM | POA: Diagnosis not present

## 2017-06-10 DIAGNOSIS — Z6841 Body Mass Index (BMI) 40.0 and over, adult: Secondary | ICD-10-CM | POA: Diagnosis not present

## 2017-06-10 DIAGNOSIS — E7800 Pure hypercholesterolemia, unspecified: Secondary | ICD-10-CM | POA: Diagnosis not present

## 2017-06-10 DIAGNOSIS — J439 Emphysema, unspecified: Secondary | ICD-10-CM | POA: Diagnosis not present

## 2017-06-10 DIAGNOSIS — G4730 Sleep apnea, unspecified: Secondary | ICD-10-CM | POA: Diagnosis not present

## 2017-06-10 DIAGNOSIS — Z299 Encounter for prophylactic measures, unspecified: Secondary | ICD-10-CM | POA: Diagnosis not present

## 2017-06-10 DIAGNOSIS — Z79899 Other long term (current) drug therapy: Secondary | ICD-10-CM | POA: Diagnosis not present

## 2017-06-10 DIAGNOSIS — I8390 Asymptomatic varicose veins of unspecified lower extremity: Secondary | ICD-10-CM | POA: Diagnosis not present

## 2017-06-10 DIAGNOSIS — B372 Candidiasis of skin and nail: Secondary | ICD-10-CM | POA: Diagnosis not present

## 2017-06-10 DIAGNOSIS — M2550 Pain in unspecified joint: Secondary | ICD-10-CM | POA: Diagnosis not present

## 2017-06-10 DIAGNOSIS — J441 Chronic obstructive pulmonary disease with (acute) exacerbation: Secondary | ICD-10-CM | POA: Diagnosis not present

## 2017-06-14 DIAGNOSIS — H40053 Ocular hypertension, bilateral: Secondary | ICD-10-CM | POA: Diagnosis not present

## 2017-06-27 DIAGNOSIS — M170 Bilateral primary osteoarthritis of knee: Secondary | ICD-10-CM | POA: Diagnosis not present

## 2017-06-27 DIAGNOSIS — M25561 Pain in right knee: Secondary | ICD-10-CM | POA: Diagnosis not present

## 2017-07-11 DIAGNOSIS — Z299 Encounter for prophylactic measures, unspecified: Secondary | ICD-10-CM | POA: Diagnosis not present

## 2017-07-11 DIAGNOSIS — H9209 Otalgia, unspecified ear: Secondary | ICD-10-CM | POA: Diagnosis not present

## 2017-07-11 DIAGNOSIS — M549 Dorsalgia, unspecified: Secondary | ICD-10-CM | POA: Diagnosis not present

## 2017-07-11 DIAGNOSIS — Z79899 Other long term (current) drug therapy: Secondary | ICD-10-CM | POA: Diagnosis not present

## 2017-07-11 DIAGNOSIS — J439 Emphysema, unspecified: Secondary | ICD-10-CM | POA: Diagnosis not present

## 2017-07-11 DIAGNOSIS — Z6841 Body Mass Index (BMI) 40.0 and over, adult: Secondary | ICD-10-CM | POA: Diagnosis not present

## 2017-07-13 DIAGNOSIS — M531 Cervicobrachial syndrome: Secondary | ICD-10-CM | POA: Diagnosis not present

## 2017-07-13 DIAGNOSIS — M47812 Spondylosis without myelopathy or radiculopathy, cervical region: Secondary | ICD-10-CM | POA: Diagnosis not present

## 2017-07-13 DIAGNOSIS — M9901 Segmental and somatic dysfunction of cervical region: Secondary | ICD-10-CM | POA: Diagnosis not present

## 2017-07-15 DIAGNOSIS — M9901 Segmental and somatic dysfunction of cervical region: Secondary | ICD-10-CM | POA: Diagnosis not present

## 2017-07-15 DIAGNOSIS — M531 Cervicobrachial syndrome: Secondary | ICD-10-CM | POA: Diagnosis not present

## 2017-07-15 DIAGNOSIS — M47812 Spondylosis without myelopathy or radiculopathy, cervical region: Secondary | ICD-10-CM | POA: Diagnosis not present

## 2017-07-18 DIAGNOSIS — M531 Cervicobrachial syndrome: Secondary | ICD-10-CM | POA: Diagnosis not present

## 2017-07-18 DIAGNOSIS — M9901 Segmental and somatic dysfunction of cervical region: Secondary | ICD-10-CM | POA: Diagnosis not present

## 2017-07-18 DIAGNOSIS — M47812 Spondylosis without myelopathy or radiculopathy, cervical region: Secondary | ICD-10-CM | POA: Diagnosis not present

## 2017-07-21 DIAGNOSIS — M531 Cervicobrachial syndrome: Secondary | ICD-10-CM | POA: Diagnosis not present

## 2017-07-21 DIAGNOSIS — M47812 Spondylosis without myelopathy or radiculopathy, cervical region: Secondary | ICD-10-CM | POA: Diagnosis not present

## 2017-07-21 DIAGNOSIS — M9901 Segmental and somatic dysfunction of cervical region: Secondary | ICD-10-CM | POA: Diagnosis not present

## 2017-07-25 DIAGNOSIS — M47812 Spondylosis without myelopathy or radiculopathy, cervical region: Secondary | ICD-10-CM | POA: Diagnosis not present

## 2017-07-25 DIAGNOSIS — M9901 Segmental and somatic dysfunction of cervical region: Secondary | ICD-10-CM | POA: Diagnosis not present

## 2017-07-25 DIAGNOSIS — M531 Cervicobrachial syndrome: Secondary | ICD-10-CM | POA: Diagnosis not present

## 2017-07-28 DIAGNOSIS — M531 Cervicobrachial syndrome: Secondary | ICD-10-CM | POA: Diagnosis not present

## 2017-07-28 DIAGNOSIS — M9901 Segmental and somatic dysfunction of cervical region: Secondary | ICD-10-CM | POA: Diagnosis not present

## 2017-07-28 DIAGNOSIS — M47812 Spondylosis without myelopathy or radiculopathy, cervical region: Secondary | ICD-10-CM | POA: Diagnosis not present

## 2017-08-01 DIAGNOSIS — M47812 Spondylosis without myelopathy or radiculopathy, cervical region: Secondary | ICD-10-CM | POA: Diagnosis not present

## 2017-08-01 DIAGNOSIS — M9901 Segmental and somatic dysfunction of cervical region: Secondary | ICD-10-CM | POA: Diagnosis not present

## 2017-08-01 DIAGNOSIS — M531 Cervicobrachial syndrome: Secondary | ICD-10-CM | POA: Diagnosis not present

## 2017-08-04 DIAGNOSIS — M9901 Segmental and somatic dysfunction of cervical region: Secondary | ICD-10-CM | POA: Diagnosis not present

## 2017-08-04 DIAGNOSIS — M47812 Spondylosis without myelopathy or radiculopathy, cervical region: Secondary | ICD-10-CM | POA: Diagnosis not present

## 2017-08-04 DIAGNOSIS — M531 Cervicobrachial syndrome: Secondary | ICD-10-CM | POA: Diagnosis not present

## 2017-08-10 DIAGNOSIS — J439 Emphysema, unspecified: Secondary | ICD-10-CM | POA: Diagnosis not present

## 2017-08-10 DIAGNOSIS — Z299 Encounter for prophylactic measures, unspecified: Secondary | ICD-10-CM | POA: Diagnosis not present

## 2017-08-10 DIAGNOSIS — Z1331 Encounter for screening for depression: Secondary | ICD-10-CM | POA: Diagnosis not present

## 2017-08-10 DIAGNOSIS — Z79899 Other long term (current) drug therapy: Secondary | ICD-10-CM | POA: Diagnosis not present

## 2017-08-10 DIAGNOSIS — M549 Dorsalgia, unspecified: Secondary | ICD-10-CM | POA: Diagnosis not present

## 2017-08-10 DIAGNOSIS — Z6841 Body Mass Index (BMI) 40.0 and over, adult: Secondary | ICD-10-CM | POA: Diagnosis not present

## 2017-08-11 DIAGNOSIS — M9901 Segmental and somatic dysfunction of cervical region: Secondary | ICD-10-CM | POA: Diagnosis not present

## 2017-08-11 DIAGNOSIS — M531 Cervicobrachial syndrome: Secondary | ICD-10-CM | POA: Diagnosis not present

## 2017-08-11 DIAGNOSIS — M47812 Spondylosis without myelopathy or radiculopathy, cervical region: Secondary | ICD-10-CM | POA: Diagnosis not present

## 2017-08-15 DIAGNOSIS — M47812 Spondylosis without myelopathy or radiculopathy, cervical region: Secondary | ICD-10-CM | POA: Diagnosis not present

## 2017-08-15 DIAGNOSIS — M9901 Segmental and somatic dysfunction of cervical region: Secondary | ICD-10-CM | POA: Diagnosis not present

## 2017-08-15 DIAGNOSIS — M531 Cervicobrachial syndrome: Secondary | ICD-10-CM | POA: Diagnosis not present

## 2017-08-16 DIAGNOSIS — E7800 Pure hypercholesterolemia, unspecified: Secondary | ICD-10-CM | POA: Diagnosis not present

## 2017-08-16 DIAGNOSIS — F419 Anxiety disorder, unspecified: Secondary | ICD-10-CM | POA: Diagnosis not present

## 2017-08-16 DIAGNOSIS — Z79899 Other long term (current) drug therapy: Secondary | ICD-10-CM | POA: Diagnosis not present

## 2017-08-16 DIAGNOSIS — Z1211 Encounter for screening for malignant neoplasm of colon: Secondary | ICD-10-CM | POA: Diagnosis not present

## 2017-08-16 DIAGNOSIS — Z7189 Other specified counseling: Secondary | ICD-10-CM | POA: Diagnosis not present

## 2017-08-16 DIAGNOSIS — Z6841 Body Mass Index (BMI) 40.0 and over, adult: Secondary | ICD-10-CM | POA: Diagnosis not present

## 2017-08-16 DIAGNOSIS — R5383 Other fatigue: Secondary | ICD-10-CM | POA: Diagnosis not present

## 2017-08-16 DIAGNOSIS — Z1339 Encounter for screening examination for other mental health and behavioral disorders: Secondary | ICD-10-CM | POA: Diagnosis not present

## 2017-08-16 DIAGNOSIS — Z299 Encounter for prophylactic measures, unspecified: Secondary | ICD-10-CM | POA: Diagnosis not present

## 2017-08-16 DIAGNOSIS — Z1331 Encounter for screening for depression: Secondary | ICD-10-CM | POA: Diagnosis not present

## 2017-08-16 DIAGNOSIS — Z0000 Encounter for general adult medical examination without abnormal findings: Secondary | ICD-10-CM | POA: Diagnosis not present

## 2017-08-18 DIAGNOSIS — Z1231 Encounter for screening mammogram for malignant neoplasm of breast: Secondary | ICD-10-CM | POA: Diagnosis not present

## 2017-08-19 DIAGNOSIS — M9901 Segmental and somatic dysfunction of cervical region: Secondary | ICD-10-CM | POA: Diagnosis not present

## 2017-08-19 DIAGNOSIS — M47812 Spondylosis without myelopathy or radiculopathy, cervical region: Secondary | ICD-10-CM | POA: Diagnosis not present

## 2017-08-19 DIAGNOSIS — M531 Cervicobrachial syndrome: Secondary | ICD-10-CM | POA: Diagnosis not present

## 2017-08-24 DIAGNOSIS — M9901 Segmental and somatic dysfunction of cervical region: Secondary | ICD-10-CM | POA: Diagnosis not present

## 2017-08-24 DIAGNOSIS — M47812 Spondylosis without myelopathy or radiculopathy, cervical region: Secondary | ICD-10-CM | POA: Diagnosis not present

## 2017-08-24 DIAGNOSIS — M531 Cervicobrachial syndrome: Secondary | ICD-10-CM | POA: Diagnosis not present

## 2017-09-06 DIAGNOSIS — M531 Cervicobrachial syndrome: Secondary | ICD-10-CM | POA: Diagnosis not present

## 2017-09-06 DIAGNOSIS — M47812 Spondylosis without myelopathy or radiculopathy, cervical region: Secondary | ICD-10-CM | POA: Diagnosis not present

## 2017-09-06 DIAGNOSIS — M9901 Segmental and somatic dysfunction of cervical region: Secondary | ICD-10-CM | POA: Diagnosis not present

## 2017-09-07 DIAGNOSIS — M879 Osteonecrosis, unspecified: Secondary | ICD-10-CM | POA: Diagnosis not present

## 2017-09-07 DIAGNOSIS — M1711 Unilateral primary osteoarthritis, right knee: Secondary | ICD-10-CM | POA: Diagnosis not present

## 2017-09-07 DIAGNOSIS — M25561 Pain in right knee: Secondary | ICD-10-CM | POA: Diagnosis not present

## 2017-09-07 DIAGNOSIS — S83241A Other tear of medial meniscus, current injury, right knee, initial encounter: Secondary | ICD-10-CM | POA: Diagnosis not present

## 2017-09-07 DIAGNOSIS — S83281A Other tear of lateral meniscus, current injury, right knee, initial encounter: Secondary | ICD-10-CM | POA: Diagnosis not present

## 2017-09-13 DIAGNOSIS — M47812 Spondylosis without myelopathy or radiculopathy, cervical region: Secondary | ICD-10-CM | POA: Diagnosis not present

## 2017-09-13 DIAGNOSIS — M9901 Segmental and somatic dysfunction of cervical region: Secondary | ICD-10-CM | POA: Diagnosis not present

## 2017-09-13 DIAGNOSIS — M531 Cervicobrachial syndrome: Secondary | ICD-10-CM | POA: Diagnosis not present

## 2017-09-26 DIAGNOSIS — F419 Anxiety disorder, unspecified: Secondary | ICD-10-CM | POA: Diagnosis not present

## 2017-09-26 DIAGNOSIS — Z299 Encounter for prophylactic measures, unspecified: Secondary | ICD-10-CM | POA: Diagnosis not present

## 2017-09-26 DIAGNOSIS — Z23 Encounter for immunization: Secondary | ICD-10-CM | POA: Diagnosis not present

## 2017-09-26 DIAGNOSIS — J439 Emphysema, unspecified: Secondary | ICD-10-CM | POA: Diagnosis not present

## 2017-09-28 DIAGNOSIS — M170 Bilateral primary osteoarthritis of knee: Secondary | ICD-10-CM | POA: Diagnosis not present

## 2017-09-28 DIAGNOSIS — M531 Cervicobrachial syndrome: Secondary | ICD-10-CM | POA: Diagnosis not present

## 2017-09-28 DIAGNOSIS — M9901 Segmental and somatic dysfunction of cervical region: Secondary | ICD-10-CM | POA: Diagnosis not present

## 2017-09-28 DIAGNOSIS — M47812 Spondylosis without myelopathy or radiculopathy, cervical region: Secondary | ICD-10-CM | POA: Diagnosis not present

## 2017-09-28 DIAGNOSIS — M25561 Pain in right knee: Secondary | ICD-10-CM | POA: Diagnosis not present

## 2017-09-28 DIAGNOSIS — S83271D Complex tear of lateral meniscus, current injury, right knee, subsequent encounter: Secondary | ICD-10-CM | POA: Diagnosis not present

## 2017-09-30 DIAGNOSIS — S83271A Complex tear of lateral meniscus, current injury, right knee, initial encounter: Secondary | ICD-10-CM | POA: Diagnosis not present

## 2017-09-30 DIAGNOSIS — Z9851 Tubal ligation status: Secondary | ICD-10-CM | POA: Diagnosis not present

## 2017-09-30 DIAGNOSIS — Z6841 Body Mass Index (BMI) 40.0 and over, adult: Secondary | ICD-10-CM | POA: Diagnosis not present

## 2017-09-30 DIAGNOSIS — M810 Age-related osteoporosis without current pathological fracture: Secondary | ICD-10-CM | POA: Diagnosis not present

## 2017-09-30 DIAGNOSIS — Z87891 Personal history of nicotine dependence: Secondary | ICD-10-CM | POA: Diagnosis not present

## 2017-09-30 DIAGNOSIS — Z9049 Acquired absence of other specified parts of digestive tract: Secondary | ICD-10-CM | POA: Diagnosis not present

## 2017-09-30 DIAGNOSIS — G4733 Obstructive sleep apnea (adult) (pediatric): Secondary | ICD-10-CM | POA: Diagnosis not present

## 2017-09-30 DIAGNOSIS — J439 Emphysema, unspecified: Secondary | ICD-10-CM | POA: Diagnosis not present

## 2017-09-30 DIAGNOSIS — M170 Bilateral primary osteoarthritis of knee: Secondary | ICD-10-CM | POA: Diagnosis not present

## 2017-09-30 DIAGNOSIS — F419 Anxiety disorder, unspecified: Secondary | ICD-10-CM | POA: Diagnosis not present

## 2017-09-30 DIAGNOSIS — S83241A Other tear of medial meniscus, current injury, right knee, initial encounter: Secondary | ICD-10-CM | POA: Diagnosis not present

## 2017-09-30 DIAGNOSIS — E669 Obesity, unspecified: Secondary | ICD-10-CM | POA: Diagnosis not present

## 2017-10-04 DIAGNOSIS — M23303 Other meniscus derangements, unspecified medial meniscus, right knee: Secondary | ICD-10-CM | POA: Diagnosis not present

## 2017-10-04 DIAGNOSIS — M170 Bilateral primary osteoarthritis of knee: Secondary | ICD-10-CM | POA: Diagnosis not present

## 2017-10-04 DIAGNOSIS — S83271A Complex tear of lateral meniscus, current injury, right knee, initial encounter: Secondary | ICD-10-CM | POA: Diagnosis not present

## 2017-10-04 DIAGNOSIS — S83241A Other tear of medial meniscus, current injury, right knee, initial encounter: Secondary | ICD-10-CM | POA: Diagnosis not present

## 2017-10-12 DIAGNOSIS — Z6841 Body Mass Index (BMI) 40.0 and over, adult: Secondary | ICD-10-CM | POA: Diagnosis not present

## 2017-10-12 DIAGNOSIS — Z299 Encounter for prophylactic measures, unspecified: Secondary | ICD-10-CM | POA: Diagnosis not present

## 2017-10-12 DIAGNOSIS — M549 Dorsalgia, unspecified: Secondary | ICD-10-CM | POA: Diagnosis not present

## 2017-10-12 DIAGNOSIS — J439 Emphysema, unspecified: Secondary | ICD-10-CM | POA: Diagnosis not present

## 2017-10-12 DIAGNOSIS — Z79899 Other long term (current) drug therapy: Secondary | ICD-10-CM | POA: Diagnosis not present

## 2017-11-11 DIAGNOSIS — Z79899 Other long term (current) drug therapy: Secondary | ICD-10-CM | POA: Diagnosis not present

## 2017-11-11 DIAGNOSIS — Z299 Encounter for prophylactic measures, unspecified: Secondary | ICD-10-CM | POA: Diagnosis not present

## 2017-11-11 DIAGNOSIS — J439 Emphysema, unspecified: Secondary | ICD-10-CM | POA: Diagnosis not present

## 2017-11-11 DIAGNOSIS — M549 Dorsalgia, unspecified: Secondary | ICD-10-CM | POA: Diagnosis not present

## 2017-11-11 DIAGNOSIS — I8390 Asymptomatic varicose veins of unspecified lower extremity: Secondary | ICD-10-CM | POA: Diagnosis not present

## 2017-11-11 DIAGNOSIS — E7800 Pure hypercholesterolemia, unspecified: Secondary | ICD-10-CM | POA: Diagnosis not present

## 2017-11-11 DIAGNOSIS — Z6841 Body Mass Index (BMI) 40.0 and over, adult: Secondary | ICD-10-CM | POA: Diagnosis not present

## 2017-11-11 DIAGNOSIS — G4730 Sleep apnea, unspecified: Secondary | ICD-10-CM | POA: Diagnosis not present

## 2017-11-17 DIAGNOSIS — M25561 Pain in right knee: Secondary | ICD-10-CM | POA: Diagnosis not present

## 2017-11-23 DIAGNOSIS — E7800 Pure hypercholesterolemia, unspecified: Secondary | ICD-10-CM | POA: Diagnosis not present

## 2017-11-23 DIAGNOSIS — Z6841 Body Mass Index (BMI) 40.0 and over, adult: Secondary | ICD-10-CM | POA: Diagnosis not present

## 2017-11-23 DIAGNOSIS — J439 Emphysema, unspecified: Secondary | ICD-10-CM | POA: Diagnosis not present

## 2017-11-23 DIAGNOSIS — Z299 Encounter for prophylactic measures, unspecified: Secondary | ICD-10-CM | POA: Diagnosis not present

## 2017-11-23 DIAGNOSIS — R21 Rash and other nonspecific skin eruption: Secondary | ICD-10-CM | POA: Diagnosis not present

## 2017-11-30 DIAGNOSIS — I872 Venous insufficiency (chronic) (peripheral): Secondary | ICD-10-CM | POA: Diagnosis not present

## 2017-11-30 DIAGNOSIS — B9689 Other specified bacterial agents as the cause of diseases classified elsewhere: Secondary | ICD-10-CM | POA: Diagnosis not present

## 2017-11-30 DIAGNOSIS — L02229 Furuncle of trunk, unspecified: Secondary | ICD-10-CM | POA: Diagnosis not present

## 2017-12-12 DIAGNOSIS — Z79899 Other long term (current) drug therapy: Secondary | ICD-10-CM | POA: Diagnosis not present

## 2017-12-12 DIAGNOSIS — J441 Chronic obstructive pulmonary disease with (acute) exacerbation: Secondary | ICD-10-CM | POA: Diagnosis not present

## 2017-12-12 DIAGNOSIS — Z6841 Body Mass Index (BMI) 40.0 and over, adult: Secondary | ICD-10-CM | POA: Diagnosis not present

## 2017-12-12 DIAGNOSIS — Z299 Encounter for prophylactic measures, unspecified: Secondary | ICD-10-CM | POA: Diagnosis not present

## 2017-12-12 DIAGNOSIS — Z87891 Personal history of nicotine dependence: Secondary | ICD-10-CM | POA: Diagnosis not present

## 2017-12-12 DIAGNOSIS — M549 Dorsalgia, unspecified: Secondary | ICD-10-CM | POA: Diagnosis not present

## 2017-12-13 DIAGNOSIS — G4733 Obstructive sleep apnea (adult) (pediatric): Secondary | ICD-10-CM | POA: Diagnosis not present

## 2017-12-22 DIAGNOSIS — Z299 Encounter for prophylactic measures, unspecified: Secondary | ICD-10-CM | POA: Diagnosis not present

## 2017-12-22 DIAGNOSIS — Z6841 Body Mass Index (BMI) 40.0 and over, adult: Secondary | ICD-10-CM | POA: Diagnosis not present

## 2017-12-22 DIAGNOSIS — J069 Acute upper respiratory infection, unspecified: Secondary | ICD-10-CM | POA: Diagnosis not present

## 2017-12-22 DIAGNOSIS — M170 Bilateral primary osteoarthritis of knee: Secondary | ICD-10-CM | POA: Diagnosis not present

## 2017-12-22 DIAGNOSIS — J439 Emphysema, unspecified: Secondary | ICD-10-CM | POA: Diagnosis not present

## 2018-01-12 DIAGNOSIS — J439 Emphysema, unspecified: Secondary | ICD-10-CM | POA: Diagnosis not present

## 2018-01-12 DIAGNOSIS — J069 Acute upper respiratory infection, unspecified: Secondary | ICD-10-CM | POA: Diagnosis not present

## 2018-01-12 DIAGNOSIS — F419 Anxiety disorder, unspecified: Secondary | ICD-10-CM | POA: Diagnosis not present

## 2018-01-12 DIAGNOSIS — Z6841 Body Mass Index (BMI) 40.0 and over, adult: Secondary | ICD-10-CM | POA: Diagnosis not present

## 2018-01-12 DIAGNOSIS — M549 Dorsalgia, unspecified: Secondary | ICD-10-CM | POA: Diagnosis not present

## 2018-01-12 DIAGNOSIS — Z79899 Other long term (current) drug therapy: Secondary | ICD-10-CM | POA: Diagnosis not present

## 2018-01-12 DIAGNOSIS — Z299 Encounter for prophylactic measures, unspecified: Secondary | ICD-10-CM | POA: Diagnosis not present

## 2018-01-25 DIAGNOSIS — S83271A Complex tear of lateral meniscus, current injury, right knee, initial encounter: Secondary | ICD-10-CM | POA: Diagnosis not present

## 2018-02-03 DIAGNOSIS — H401131 Primary open-angle glaucoma, bilateral, mild stage: Secondary | ICD-10-CM | POA: Diagnosis not present

## 2018-02-10 DIAGNOSIS — Z6841 Body Mass Index (BMI) 40.0 and over, adult: Secondary | ICD-10-CM | POA: Diagnosis not present

## 2018-02-10 DIAGNOSIS — J439 Emphysema, unspecified: Secondary | ICD-10-CM | POA: Diagnosis not present

## 2018-02-10 DIAGNOSIS — Z79899 Other long term (current) drug therapy: Secondary | ICD-10-CM | POA: Diagnosis not present

## 2018-02-10 DIAGNOSIS — Z299 Encounter for prophylactic measures, unspecified: Secondary | ICD-10-CM | POA: Diagnosis not present

## 2018-02-10 DIAGNOSIS — M549 Dorsalgia, unspecified: Secondary | ICD-10-CM | POA: Diagnosis not present

## 2018-02-10 DIAGNOSIS — M542 Cervicalgia: Secondary | ICD-10-CM | POA: Diagnosis not present

## 2018-02-10 DIAGNOSIS — Z87891 Personal history of nicotine dependence: Secondary | ICD-10-CM | POA: Diagnosis not present

## 2018-02-10 DIAGNOSIS — E7800 Pure hypercholesterolemia, unspecified: Secondary | ICD-10-CM | POA: Diagnosis not present

## 2018-02-23 DIAGNOSIS — R600 Localized edema: Secondary | ICD-10-CM | POA: Diagnosis not present

## 2018-02-23 DIAGNOSIS — J439 Emphysema, unspecified: Secondary | ICD-10-CM | POA: Diagnosis not present

## 2018-02-23 DIAGNOSIS — J209 Acute bronchitis, unspecified: Secondary | ICD-10-CM | POA: Diagnosis not present

## 2018-02-23 DIAGNOSIS — Z299 Encounter for prophylactic measures, unspecified: Secondary | ICD-10-CM | POA: Diagnosis not present

## 2018-02-23 DIAGNOSIS — Z6841 Body Mass Index (BMI) 40.0 and over, adult: Secondary | ICD-10-CM | POA: Diagnosis not present

## 2018-02-23 DIAGNOSIS — J440 Chronic obstructive pulmonary disease with acute lower respiratory infection: Secondary | ICD-10-CM | POA: Diagnosis not present

## 2018-03-06 DIAGNOSIS — Z299 Encounter for prophylactic measures, unspecified: Secondary | ICD-10-CM | POA: Diagnosis not present

## 2018-03-06 DIAGNOSIS — Z6841 Body Mass Index (BMI) 40.0 and over, adult: Secondary | ICD-10-CM | POA: Diagnosis not present

## 2018-03-06 DIAGNOSIS — J439 Emphysema, unspecified: Secondary | ICD-10-CM | POA: Diagnosis not present

## 2018-03-06 DIAGNOSIS — J069 Acute upper respiratory infection, unspecified: Secondary | ICD-10-CM | POA: Diagnosis not present

## 2018-03-09 DIAGNOSIS — J439 Emphysema, unspecified: Secondary | ICD-10-CM | POA: Diagnosis not present

## 2018-03-09 DIAGNOSIS — Z299 Encounter for prophylactic measures, unspecified: Secondary | ICD-10-CM | POA: Diagnosis not present

## 2018-03-09 DIAGNOSIS — S81812A Laceration without foreign body, left lower leg, initial encounter: Secondary | ICD-10-CM | POA: Diagnosis not present

## 2018-03-09 DIAGNOSIS — Z6841 Body Mass Index (BMI) 40.0 and over, adult: Secondary | ICD-10-CM | POA: Diagnosis not present

## 2018-03-14 DIAGNOSIS — Z6841 Body Mass Index (BMI) 40.0 and over, adult: Secondary | ICD-10-CM | POA: Diagnosis not present

## 2018-03-14 DIAGNOSIS — M549 Dorsalgia, unspecified: Secondary | ICD-10-CM | POA: Diagnosis not present

## 2018-03-14 DIAGNOSIS — Z79899 Other long term (current) drug therapy: Secondary | ICD-10-CM | POA: Diagnosis not present

## 2018-03-14 DIAGNOSIS — Z299 Encounter for prophylactic measures, unspecified: Secondary | ICD-10-CM | POA: Diagnosis not present

## 2018-03-14 DIAGNOSIS — S81812A Laceration without foreign body, left lower leg, initial encounter: Secondary | ICD-10-CM | POA: Diagnosis not present

## 2018-03-14 DIAGNOSIS — J439 Emphysema, unspecified: Secondary | ICD-10-CM | POA: Diagnosis not present

## 2018-04-13 DIAGNOSIS — Z79899 Other long term (current) drug therapy: Secondary | ICD-10-CM | POA: Diagnosis not present

## 2018-04-13 DIAGNOSIS — Z96651 Presence of right artificial knee joint: Secondary | ICD-10-CM | POA: Diagnosis not present

## 2018-04-13 DIAGNOSIS — R600 Localized edema: Secondary | ICD-10-CM | POA: Diagnosis not present

## 2018-04-13 DIAGNOSIS — M5020 Other cervical disc displacement, unspecified cervical region: Secondary | ICD-10-CM | POA: Diagnosis not present

## 2018-04-13 DIAGNOSIS — I8390 Asymptomatic varicose veins of unspecified lower extremity: Secondary | ICD-10-CM | POA: Diagnosis not present

## 2018-04-13 DIAGNOSIS — Z5181 Encounter for therapeutic drug level monitoring: Secondary | ICD-10-CM | POA: Diagnosis not present

## 2018-04-13 DIAGNOSIS — H401131 Primary open-angle glaucoma, bilateral, mild stage: Secondary | ICD-10-CM | POA: Diagnosis not present

## 2018-04-13 DIAGNOSIS — Z299 Encounter for prophylactic measures, unspecified: Secondary | ICD-10-CM | POA: Diagnosis not present

## 2018-04-13 DIAGNOSIS — J439 Emphysema, unspecified: Secondary | ICD-10-CM | POA: Diagnosis not present

## 2018-04-13 DIAGNOSIS — R609 Edema, unspecified: Secondary | ICD-10-CM | POA: Diagnosis not present

## 2018-05-12 DIAGNOSIS — R609 Edema, unspecified: Secondary | ICD-10-CM | POA: Diagnosis not present

## 2018-05-12 DIAGNOSIS — M2550 Pain in unspecified joint: Secondary | ICD-10-CM | POA: Diagnosis not present

## 2018-05-12 DIAGNOSIS — Z299 Encounter for prophylactic measures, unspecified: Secondary | ICD-10-CM | POA: Diagnosis not present

## 2018-05-12 DIAGNOSIS — J439 Emphysema, unspecified: Secondary | ICD-10-CM | POA: Diagnosis not present

## 2018-05-12 DIAGNOSIS — Z79899 Other long term (current) drug therapy: Secondary | ICD-10-CM | POA: Diagnosis not present

## 2018-05-12 DIAGNOSIS — Z6841 Body Mass Index (BMI) 40.0 and over, adult: Secondary | ICD-10-CM | POA: Diagnosis not present

## 2018-06-01 DIAGNOSIS — M25561 Pain in right knee: Secondary | ICD-10-CM | POA: Diagnosis not present

## 2018-06-01 DIAGNOSIS — J439 Emphysema, unspecified: Secondary | ICD-10-CM | POA: Diagnosis not present

## 2018-06-01 DIAGNOSIS — Z6841 Body Mass Index (BMI) 40.0 and over, adult: Secondary | ICD-10-CM | POA: Diagnosis not present

## 2018-06-01 DIAGNOSIS — Z299 Encounter for prophylactic measures, unspecified: Secondary | ICD-10-CM | POA: Diagnosis not present

## 2018-06-01 DIAGNOSIS — E7800 Pure hypercholesterolemia, unspecified: Secondary | ICD-10-CM | POA: Diagnosis not present

## 2018-06-01 DIAGNOSIS — M2550 Pain in unspecified joint: Secondary | ICD-10-CM | POA: Diagnosis not present

## 2018-06-12 DIAGNOSIS — Z79899 Other long term (current) drug therapy: Secondary | ICD-10-CM | POA: Diagnosis not present

## 2018-06-12 DIAGNOSIS — Z6841 Body Mass Index (BMI) 40.0 and over, adult: Secondary | ICD-10-CM | POA: Diagnosis not present

## 2018-06-12 DIAGNOSIS — M542 Cervicalgia: Secondary | ICD-10-CM | POA: Diagnosis not present

## 2018-06-12 DIAGNOSIS — J439 Emphysema, unspecified: Secondary | ICD-10-CM | POA: Diagnosis not present

## 2018-06-12 DIAGNOSIS — Z299 Encounter for prophylactic measures, unspecified: Secondary | ICD-10-CM | POA: Diagnosis not present

## 2018-06-12 DIAGNOSIS — M545 Low back pain: Secondary | ICD-10-CM | POA: Diagnosis not present

## 2018-06-27 DIAGNOSIS — M170 Bilateral primary osteoarthritis of knee: Secondary | ICD-10-CM | POA: Diagnosis not present

## 2018-06-27 DIAGNOSIS — M1711 Unilateral primary osteoarthritis, right knee: Secondary | ICD-10-CM | POA: Diagnosis not present

## 2018-07-03 ENCOUNTER — Other Ambulatory Visit: Payer: Self-pay

## 2018-07-03 ENCOUNTER — Ambulatory Visit: Payer: Self-pay | Admitting: Internal Medicine

## 2018-07-03 ENCOUNTER — Ambulatory Visit (INDEPENDENT_AMBULATORY_CARE_PROVIDER_SITE_OTHER): Payer: Self-pay

## 2018-07-03 ENCOUNTER — Encounter: Payer: Self-pay | Admitting: Internal Medicine

## 2018-07-03 DIAGNOSIS — J449 Chronic obstructive pulmonary disease, unspecified: Secondary | ICD-10-CM

## 2018-07-03 DIAGNOSIS — Z9989 Dependence on other enabling machines and devices: Secondary | ICD-10-CM

## 2018-07-03 DIAGNOSIS — G4733 Obstructive sleep apnea (adult) (pediatric): Secondary | ICD-10-CM

## 2018-07-03 NOTE — Patient Instructions (Addendum)
Be sure timolol is not an ingredient in your eyedrops  Plan A = Automatic = Symbicort 160 Take 2 puffs first thing in am and then another 2 puffs about 12 hours later and singulair 10 mg each pm  Work on inhaler technique:  relax and gently blow all the way out then take a nice smooth deep breath back in, triggering the inhaler at same time you start breathing in.  Hold for up to 5 seconds if you can. Blow out thru nose. Rinse and gargle with water when done   Plan B = Backup Only use your albuterol(ventolin/proair)  inhaler as a rescue medication to be used if you can't catch your breath by resting or doing a relaxed purse lip breathing pattern.  - The less you use it, the better it will work when you need it. - Ok to use the inhaler up to 2 puffs  every 4 hours if you must but call for appointment if use goes up over your usual need - Don't leave home without it !!  (think of it like the spare tire for your car)   Plan C = Crisis - only use your albuterol-ipatropium  nebulizer if you first try Plan B and it fails to help > ok to use the nebulizer up to every 4 hours but if start needing it regularly call for immediate appointment   Plan D = Doctor - call me if B and C not adequate  Plan E = ER - go to ER or call 911 if all else fails     Please remember to go to the  x-ray department  for your tests - we will call you with the results when they are available    Sign for Dr Henderson's records to include pfts   Please schedule a follow up visit in 3 months but call sooner if needed  with all medications /inhalers/ solutions in hand so we can verify exactly what you are taking. This includes all medications from all doctors and over the counters

## 2018-07-03 NOTE — Progress Notes (Signed)
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  Allergy testing in [place] 2014 neg per pt ? Dr Willa RoughHicks?      History of Present Illness  07/03/2018  Pulmonary/ 1st office eval/Kamilya Wakeman  maint symbicort / singulair  Chief Complaint  Patient presents with  . Pulmonary Consult    Referred by Dr. Sherryll BurgerShah for eval of COPD.    Dyspnea:  Slow pace does [employer] but can't do the whole store even slowly   = MMRC3 = can't walk 100 yards even at a slow pace at a flat grade s stopping due to sob / also limited by R > L knee  Cough: severe dry sporadic Sleep: cpap on L side 10 degrees bed blocks SABA use: non albuterol Last prednisone early June 2020  Has 02 not using   No obvious day to day or daytime variability or assoc excess/ purulent sputum or mucus plugs or hemoptysis or cp or chest tightness, subjective wheeze or overt sinus or hb symptoms.   As above without nocturnal  or early am exacerbation  of respiratory  c/o's or need for noct saba. Also denies any obvious fluctuation of symptoms with weather or environmental changes or other aggravating or alleviating factors except as outlined above   No unusual exposure hx or h/o childhood pna/ asthma or knowledge of premature birth.  Current Allergies, Complete Past Medical History, Past Surgical History, Family History, and Social History were reviewed in [facility] electronic medical record.  ROS  The following are not active complaints unless bolded Hoarseness, sore throat,  dysphagia, dental problems, itching, sneezing,  nasal congestion or discharge of excess mucus or purulent secretions, ear ache,   fever, chills, sweats, unintended wt loss or wt gain, classically pleuritic or exertional cp,  orthopnea pnd or arm/hand swelling  or leg swelling, presyncope, palpitations, abdominal pain, anorexia, nausea, vomiting, diarrhea  or change in bowel habits or change in bladder habits, change in stools or change in urine, dysuria, hematuria,  rash, arthralgias, visual complaints, headache, numbness, weakness or ataxia or problems with walking or coordination,  change in mood or  memory.           No past medical history on file.  Outpatient Medications Prior to Visit - - NOTE:   Unable to verify as accurately reflecting what pt takes     Medication Sig Dispense Refill  . albuterol (PROAIR HFA) 108 (90 Base) MCG/ACT inhaler Inhale 2 puffs into the lungs every 6 (six) hours as needed for wheezing or shortness of breath.    Marland Kitchen. albuterol (PROVENTIL) (2.5 MG/3ML) 0.083% nebulizer solution Inhale 3 mLs into the lungs every 6 (six) hours as needed. For shortness of breath    . budesonide-formoterol (SYMBICORT) 160-4.5 MCG/ACT inhaler Inhale 2 puffs into the lungs 2 (two) times daily.    Marland Kitchen. gabapentin (NEURONTIN) 300 MG capsule Take 300 mg by mouth 2 (two) times daily.    Marland Kitchen. HYDROcodone-acetaminophen (NORCO) 7.5-325 MG  tablet Take 1 tablet by mouth every 6 (six) hours as needed for moderate pain.    Marland Kitchen ipratropium-albuterol (DUONEB) 0.5-2.5 (3) MG/3ML SOLN Take 3 mLs by nebulization every 4 (four) hours as needed.    . Latanoprost 0.005 % EMUL Apply to eye.    Marland Kitchen LORazepam (ATIVAN) 1 MG tablet Take 1 mg by mouth 2 (two) times daily as needed. For anxiety    . montelukast (SINGULAIR) 10 MG tablet Take 10 mg by mouth at bedtime.    Marland Kitchen tiZANidine (ZANAFLEX) 4 MG tablet Take 4 mg by mouth every 6 (six) hours as needed for muscle spasms.    .       . Ascorbic Acid (VITAMIN C PO) Take 1  tablet by mouth daily.    .      .      . Cyanocobalamin (B-12 PO) Take 1 tablet by mouth daily.    . cyclobenzaprine (FLEXERIL) 10 MG tablet Take 10 mg by mouth 3 (three) times daily as needed. For muscle spasms    . fluticasone (FLONASE) 50 MCG/ACT nasal spray Place 2 sprays into both nostrils.    Marland Kitchen HYDROcodone-acetaminophen (NORCO/VICODIN) 5-325 MG per tablet Take 1 tablet by mouth every 6 (six) hours as needed. For pain    .       Marland Kitchen MAGNESIUM CARBONATE PO Take 1 tablet by mouth daily.    . Multiple Vitamin (MULTIVITAMIN WITH MINERALS) TABS tablet Take 1 tablet by mouth daily.    .       . traMADol (ULTRAM) 50 MG tablet Take 50-100 mg by mouth every 6 (six) hours as needed.         Objective:     BP 140/80 (BP Location: Left Arm, Cuff Size: Large)   Pulse 78   Temp 98.5 F (36.9 C) (Oral)   Ht 5\' 1"  (1.549 m)   Wt 290 lb (131.5 kg)   SpO2 100%   BMI 54.80 kg/m   SpO2: 100 %  RA   Amb obese f nad   HEENT: nl dentition, turbinates bilaterally, and oropharynx. Nl external ear canals without cough reflex   NECK :  without JVD/Nodes/TM/ nl carotid upstrokes bilaterally   LUNGS: no acc muscle use,  Nl contour chest which is clear to A and P bilaterally without cough on insp or exp maneuvers   CV:  RRR  no s3 or murmur or increase in P2, and no edema   ABD:  Quite obese  nontender with nl inspiratory excursion in the supine position. No bruits or organomegaly appreciated, bowel sounds nl  MS:  Nl gait/ ext warm without deformities, calf tenderness, cyanosis or clubbing No obvious joint restrictions   SKIN: warm and dry without lesions    NEURO:  alert, approp, nl sensorium with  no motor or cerebellar deficits apparent.          CXR PA and Lateral:   07/03/2018 :    I personally reviewed images and agree with radiology impression as follows:   COPD/chronic changes.  No active disease.    Labs reviewed from 05/12/18    HC03  =24      Assessment   COPD /  pfts pending   Quit smoking 1993 - 07/03/2018  After extensive coaching inhaler device,  effectiveness =    75% > resume symb 160 2bid   Need pfts to complete the w/u but need to keep in mind:  When respiratory symptoms begin or become  refractory well after a patient reports complete smoking cessation,  Especially when this wasn't the case while they were smoking, a red flag is raised based on the work of Dr Primitivo GauzeFletcher which states:  if you quit smoking when your best day FEV1 is still well preserved it is highly unlikely you will progress to severe disease.  That is to say, once the smoking stops,  the symptoms should not suddenly erupt or markedly worsen.  If so, the differential diagnosis should include  obesity/deconditioning,  LPR/Reflux/Aspiration syndromes,  occult CHF, or  especially side effect of medications commonly used in this population (eg timolol eyedrops for glaucoma precipitating asthma component)       OSA on CPAP Apparently Adequate control on present rx, reviewed in detail with pt > no change in rx needed        Morbid (severe) obesity due to excess calories (HCC) Body mass index is 54.8 kg/m.    No results found for: TSH   Contributing to OSA/  gerd risk/ doe/reviewed the need and the process to achieve and maintain neg calorie balance > defer f/u primary care including intermittently monitoring thyroid status         Total time devoted to counseling  > 50 % of initial 60 min office visit:  review case with pt/  device teaching which extended face to face time for this visit / discussion of options/alternatives/ personally creating written customized instructions  in presence of pt  then going over those specific  Instructions directly with the pt including how to use all of the meds but in particular covering each new medication in detail and the difference between the maintenance= "automatic" meds and the prns using an action plan format for the latter (If this  problem/symptom => do that organization reading Left to right).  Please see AVS from this visit for a full list of these instructions which I personally wrote for this pt and  are unique to this visit.      Sandrea HughsMichael Suellen Durocher, MD 07/03/2018

## 2018-07-04 ENCOUNTER — Encounter: Payer: Self-pay | Admitting: Internal Medicine

## 2018-07-04 DIAGNOSIS — G4733 Obstructive sleep apnea (adult) (pediatric): Secondary | ICD-10-CM | POA: Insufficient documentation

## 2018-07-04 NOTE — Assessment & Plan Note (Signed)
Apparently Adequate control on present rx, reviewed in detail with pt > no change in rx needed      Total time devoted to counseling  > 50 % of initial 60 min office visit:  review case with pt/  device teaching which extended face to face time for this visit / discussion of options/alternatives/ personally creating written customized instructions  in presence of pt  then going over those specific  Instructions directly with the pt including how to use all of the meds but in particular covering each new medication in detail and the difference between the maintenance= "automatic" meds and the prns using an action plan format for the latter (If this problem/symptom => do that organization reading Left to right).  Please see AVS from this visit for a full list of these instructions which I personally wrote for this pt and  are unique to this visit.

## 2018-07-04 NOTE — Progress Notes (Signed)
ATC, NA and no VM  

## 2018-07-04 NOTE — Assessment & Plan Note (Signed)
Body mass index is 54.8 kg/m.    No results found for: TSH   Contributing to OSA/  gerd risk/ doe/reviewed the need and the process to achieve and maintain neg calorie balance > defer f/u primary care including intermittently monitoring thyroid status

## 2018-07-04 NOTE — Assessment & Plan Note (Addendum)
Quit smoking 1993 - 07/03/2018  After extensive coaching inhaler device,  effectiveness =    75% > resume symb 160 2bid   Need pfts to complete the w/u but need to keep in mind:  When respiratory symptoms begin or become refractory well after a patient reports complete smoking cessation,  Especially when this wasn't the case while they were smoking, a red flag is raised based on the work of Dr Kris Mouton which states:  if you quit smoking when your best day FEV1 is still well preserved it is highly unlikely you will progress to severe disease.  That is to say, once the smoking stops,  the symptoms should not suddenly erupt or markedly worsen.  If so, the differential diagnosis should include  obesity/deconditioning,  LPR/Reflux/Aspiration syndromes,  occult CHF, or  especially side effect of medications commonly used in this population (eg timolol eyedrops for glaucoma precipitating asthma component)

## 2018-07-05 ENCOUNTER — Telehealth: Payer: Self-pay | Admitting: Internal Medicine

## 2018-07-05 NOTE — Telephone Encounter (Signed)
Faxed request to Dr. Koleen Nimrod for medical records 07/05/18  [facility]

## 2018-07-12 DIAGNOSIS — Z79899 Other long term (current) drug therapy: Secondary | ICD-10-CM | POA: Diagnosis not present

## 2018-07-12 DIAGNOSIS — M545 Low back pain: Secondary | ICD-10-CM | POA: Diagnosis not present

## 2018-07-12 DIAGNOSIS — J209 Acute bronchitis, unspecified: Secondary | ICD-10-CM | POA: Diagnosis not present

## 2018-07-12 DIAGNOSIS — J440 Chronic obstructive pulmonary disease with acute lower respiratory infection: Secondary | ICD-10-CM | POA: Diagnosis not present

## 2018-07-12 DIAGNOSIS — Z299 Encounter for prophylactic measures, unspecified: Secondary | ICD-10-CM | POA: Diagnosis not present

## 2018-07-12 DIAGNOSIS — Z6841 Body Mass Index (BMI) 40.0 and over, adult: Secondary | ICD-10-CM | POA: Diagnosis not present

## 2018-07-21 ENCOUNTER — Telehealth: Payer: Self-pay | Admitting: Internal Medicine

## 2018-07-21 NOTE — Telephone Encounter (Signed)
Called pt and informed:Reviewed cxr and no acute change so no change in recommendations made at ov  Nothing further needed.

## 2018-08-01 ENCOUNTER — Encounter: Payer: Self-pay | Admitting: *Deleted

## 2018-08-02 ENCOUNTER — Encounter: Payer: Self-pay | Admitting: Cardiovascular Disease

## 2018-08-02 ENCOUNTER — Other Ambulatory Visit: Payer: Self-pay

## 2018-08-02 ENCOUNTER — Encounter: Payer: Self-pay | Admitting: *Deleted

## 2018-08-02 ENCOUNTER — Ambulatory Visit (INDEPENDENT_AMBULATORY_CARE_PROVIDER_SITE_OTHER): Payer: Self-pay | Admitting: Cardiovascular Disease

## 2018-08-02 VITALS — BP 148/60 | HR 56 | Temp 97.4°F | Ht 61.0 in | Wt 292.0 lb

## 2018-08-02 DIAGNOSIS — Z9989 Dependence on other enabling machines and devices: Secondary | ICD-10-CM

## 2018-08-02 DIAGNOSIS — G4733 Obstructive sleep apnea (adult) (pediatric): Secondary | ICD-10-CM

## 2018-08-02 DIAGNOSIS — R600 Localized edema: Secondary | ICD-10-CM

## 2018-08-02 DIAGNOSIS — J449 Chronic obstructive pulmonary disease, unspecified: Secondary | ICD-10-CM

## 2018-08-02 DIAGNOSIS — R030 Elevated blood-pressure reading, without diagnosis of hypertension: Secondary | ICD-10-CM

## 2018-08-02 NOTE — Patient Instructions (Addendum)
Medication Instructions:   Your physician recommends that you continue on your current medications as directed. Please refer to the Current Medication list given to you today.  Labwork:  NONE  Testing/Procedures: Your physician has requested that you have an echocardiogram. Echocardiography is a painless test that uses sound waves to create images of your heart. It provides your doctor with information about the size and shape of your heart and how well your heart's chambers and valves are working. This procedure takes approximately one hour. There are no restrictions for this procedure.  Follow-Up:  Your physician recommends that you schedule a follow-up appointment in: 3 months.  Any Other Special Instructions Will Be Listed Below (If Applicable).  If you need a refill on your cardiac medications before your next appointment, please call your pharmacy. 

## 2018-08-02 NOTE — Progress Notes (Signed)
CARDIOLOGY CONSULT NOTE  Patient ID: Jenna Rivera MRN: [id number] DOB/AGE: Mar 21, 1960 58 y.o.  Admit date: (Not on file) Primary Physician: Kirstie PeriShah, Ashish, MD  Referring provider: Salley SlaughterAngela Boone NP.  Reason for Consultation: Bilateral leg edema  HPI: Jenna Crankerlsie W Bradish is a 58 y.o. female who is being seen today for the evaluation of bilateral leg edema at the request of Salley SlaughterAngela Boone NP.  Past medical history includes COPD, chronic pain and morbid obesity.  She told me back in May and June, she put on 15 pounds of fluid with bilateral leg swelling.  She was prescribed Lasix 40 mg to be taken as needed.  She is down to 292 pounds.  She denies chest pain.  She has palpitations primarily at night.  She denies orthopnea and paroxysmal nocturnal dyspnea.  She is scheduled undergo PFTs.  She told me she underwent an echocardiogram 2 years ago at her PCPs office but I do not have a copy of this report.  She also has obstructive sleep apnea and uses CPAP nightly.  She uses 2 L of oxygen as needed.  ECG performed today which I ordered and personally interpreted demonstrates sinus bradycardia, 56 bpm.  Social history: Her husband is disabled.  He has mental health issues and orthopedic issues.  She has an adult son who is an [place] [facility] [employer] [place].   No Known Allergies  Current Outpatient Medications  Medication Sig Dispense Refill  . albuterol (PROAIR HFA) 108 (90 Base) MCG/ACT inhaler Inhale 2 puffs into the lungs every 6 (six) hours as needed for wheezing or shortness of breath.    Marland Kitchen. albuterol (PROVENTIL) (2.5 MG/3ML) 0.083% nebulizer solution Inhale 3 mLs into the lungs every 6 (six) hours as needed. For shortness of breath    . azelastine (ASTELIN) 0.1 % nasal spray Place 2 sprays into both nostrils 2 (two) times daily. Use in each nostril as directed    . budesonide-formoterol (SYMBICORT) 160-4.5 MCG/ACT inhaler Inhale 2 puffs into the lungs 2 (two) times daily.    .  fluticasone (FLONASE) 50 MCG/ACT nasal spray Place 2 sprays into both nostrils daily.    . furosemide (LASIX) 40 MG tablet Take 40 mg by mouth daily as needed.    . gabapentin (NEURONTIN) 300 MG capsule Take 300 mg by mouth 2 (two) times daily.    Marland Kitchen. HYDROcodone-acetaminophen (NORCO) 7.5-325 MG tablet Take 1 tablet by mouth every 6 (six) hours as needed for moderate pain.    Marland Kitchen. ipratropium-albuterol (DUONEB) 0.5-2.5 (3) MG/3ML SOLN Take 3 mLs by nebulization every 4 (four) hours as needed.    . Latanoprost 0.005 % EMUL Apply to eye.    Marland Kitchen. LORazepam (ATIVAN) 1 MG tablet Take 1 mg by mouth 2 (two) times daily as needed. For anxiety    . montelukast (SINGULAIR) 10 MG tablet Take 10 mg by mouth at bedtime.    . potassium chloride (K-DUR) 10 MEQ tablet Take 20 mEq by mouth daily as needed.    Marland Kitchen. tiZANidine (ZANAFLEX) 4 MG tablet Take 4 mg by mouth every 6 (six) hours as needed for muscle spasms.     No current facility-administered medications for this visit.     Past Medical History:  Diagnosis Date  . Acute bronchitis with COPD (HCC)   . Allergic rhinitis   . Anxiety   . Asthma   . Cervical disc herniation   . Chest pain   . Chronic pain   . Edema   .  Hyperlipidemia   . Lumbago   . Neck pain   . Osteoporosis   . Reflux esophagitis   . Rosacea   . Sleep apnea     Past Surgical History:  Procedure Laterality Date  . ABDOMINAL HYSTERECTOMY    . CHOLECYSTECTOMY    . MOUTH SURGERY    . TUBAL LIGATION      Social History   Socioeconomic History  . Marital status: Married    Spouse name: Not on file  . Number of children: Not on file  . Years of education: Not on file  . Highest education level: Not on file  Occupational History  . Not on file  Social Needs  . Financial resource strain: Not on file  . Food insecurity    Worry: Not on file    Inability: Not on file  . Transportation needs    Medical: Not on file    Non-medical: Not on file  Tobacco Use  . Smoking status:  Former Smoker    Packs/day: 1.00    Years: 15.00    Pack years: 15.00    Types: Cigarettes    Quit date: 01/05/1991    Years since quitting: 27.5  . Smokeless tobacco: Never Used  Substance and Sexual Activity  . Alcohol use: No  . Drug use: Not on file  . Sexual activity: Not on file  Lifestyle  . Physical activity    Days per week: Not on file    Minutes per session: Not on file  . Stress: Not on file  Relationships  . Social Herbalist on phone: Not on file    Gets together: Not on file    Attends religious service: Not on file    Active member of club or organization: Not on file    Attends meetings of clubs or organizations: Not on file    Relationship status: Not on file  . Intimate partner violence    Fear of current or ex partner: Not on file    Emotionally abused: Not on file    Physically abused: Not on file    Forced sexual activity: Not on file  Other Topics Concern  . Not on file  Social History Narrative  . Not on file     No family history of premature CAD in 1st degree relatives.  Current Meds  Medication Sig  . albuterol (PROAIR HFA) 108 (90 Base) MCG/ACT inhaler Inhale 2 puffs into the lungs every 6 (six) hours as needed for wheezing or shortness of breath.  Marland Kitchen albuterol (PROVENTIL) (2.5 MG/3ML) 0.083% nebulizer solution Inhale 3 mLs into the lungs every 6 (six) hours as needed. For shortness of breath  . azelastine (ASTELIN) 0.1 % nasal spray Place 2 sprays into both nostrils 2 (two) times daily. Use in each nostril as directed  . budesonide-formoterol (SYMBICORT) 160-4.5 MCG/ACT inhaler Inhale 2 puffs into the lungs 2 (two) times daily.  . fluticasone (FLONASE) 50 MCG/ACT nasal spray Place 2 sprays into both nostrils daily.  . furosemide (LASIX) 40 MG tablet Take 40 mg by mouth daily as needed.  . gabapentin (NEURONTIN) 300 MG capsule Take 300 mg by mouth 2 (two) times daily.  Marland Kitchen HYDROcodone-acetaminophen (NORCO) 7.5-325 MG tablet Take 1  tablet by mouth every 6 (six) hours as needed for moderate pain.  Marland Kitchen ipratropium-albuterol (DUONEB) 0.5-2.5 (3) MG/3ML SOLN Take 3 mLs by nebulization every 4 (four) hours as needed.  . Latanoprost 0.005 % EMUL Apply to  eye.  Marland Kitchen. LORazepam (ATIVAN) 1 MG tablet Take 1 mg by mouth 2 (two) times daily as needed. For anxiety  . montelukast (SINGULAIR) 10 MG tablet Take 10 mg by mouth at bedtime.  . potassium chloride (K-DUR) 10 MEQ tablet Take 20 mEq by mouth daily as needed.  Marland Kitchen. tiZANidine (ZANAFLEX) 4 MG tablet Take 4 mg by mouth every 6 (six) hours as needed for muscle spasms.      Review of systems complete and found to be negative unless listed above in HPI    Physical exam Blood pressure (!) 148/60, pulse (!) 56, height 5\' 1"  (1.549 m), weight 292 lb (132.5 kg), SpO2 98 %. General: NAD Neck: No JVD, no thyromegaly or thyroid nodule.  Lungs: Clear to auscultation bilaterally with normal respiratory effort. CV: Nondisplaced PMI. Regular rate and rhythm, normal S1/S2, no S3/S4, no murmur.  Trace bilateral peri-ankle edema.  Venous varicosities in bilateral lower extremities. Abdomen: Soft, nontender, obese.  Skin: Intact without lesions or rashes.  Neurologic: Alert and oriented x 3.  Psych: Normal affect. Extremities: No clubbing or cyanosis.  HEENT: Normal.   ECG: Most recent ECG reviewed.   Labs: No results found for: K, BUN, CREATININE, ALT, TSH, HGB   Lipids: No results found for: LDLCALC, LDLDIRECT, CHOL, TRIG, HDL      ASSESSMENT AND PLAN:  1.  Bilateral leg edema: I will obtain a copy of the echocardiogram report from 2 years ago. I will order a 2-D echocardiogram with Doppler to evaluate cardiac structure, function, and regional wall motion. This could also be the results of morbid obesity and venous varicosities, both of which would impede venous return and lead to leg swelling.  She continues to take Lasix as needed.  2.  Elevated blood pressure: She does not carry a  diagnosis of hypertension.  This will need further monitoring.  3.  Morbid obesity: She need significant weight loss.  She would like to begin exercising in the gym when it reopens.  4.  Obstructive sleep apnea: Uses CPAP.  5.  COPD: She is being managed by pulmonary and is scheduled undergo PFTs.     Disposition: Follow up in 3 months  Signed: Prentice DockerSuresh Grabiel Schmutz, M.D., F.A.C.C.  08/02/2018, 2:45 PM

## 2018-08-09 ENCOUNTER — Ambulatory Visit (INDEPENDENT_AMBULATORY_CARE_PROVIDER_SITE_OTHER): Payer: PPO

## 2018-08-09 ENCOUNTER — Other Ambulatory Visit: Payer: Self-pay

## 2018-08-09 DIAGNOSIS — R600 Localized edema: Secondary | ICD-10-CM | POA: Diagnosis not present

## 2018-08-10 ENCOUNTER — Telehealth: Payer: Self-pay | Admitting: Cardiovascular Disease

## 2018-08-10 NOTE — Telephone Encounter (Signed)
Patient informed. Copy sent to PCP °

## 2018-08-10 NOTE — Telephone Encounter (Signed)
-----   Message from Herminio Commons, MD sent at 08/09/2018 12:56 PM EDT ----- Normal echo.

## 2018-08-10 NOTE — Telephone Encounter (Signed)
Returning someones call 

## 2018-08-11 DIAGNOSIS — J209 Acute bronchitis, unspecified: Secondary | ICD-10-CM | POA: Diagnosis not present

## 2018-08-11 DIAGNOSIS — Z6841 Body Mass Index (BMI) 40.0 and over, adult: Secondary | ICD-10-CM | POA: Diagnosis not present

## 2018-08-11 DIAGNOSIS — J440 Chronic obstructive pulmonary disease with acute lower respiratory infection: Secondary | ICD-10-CM | POA: Diagnosis not present

## 2018-08-11 DIAGNOSIS — M549 Dorsalgia, unspecified: Secondary | ICD-10-CM | POA: Diagnosis not present

## 2018-08-11 DIAGNOSIS — G4733 Obstructive sleep apnea (adult) (pediatric): Secondary | ICD-10-CM | POA: Diagnosis not present

## 2018-08-11 DIAGNOSIS — Z79899 Other long term (current) drug therapy: Secondary | ICD-10-CM | POA: Diagnosis not present

## 2018-08-11 DIAGNOSIS — Z299 Encounter for prophylactic measures, unspecified: Secondary | ICD-10-CM | POA: Diagnosis not present

## 2018-08-11 DIAGNOSIS — J439 Emphysema, unspecified: Secondary | ICD-10-CM | POA: Diagnosis not present

## 2018-08-22 DIAGNOSIS — J439 Emphysema, unspecified: Secondary | ICD-10-CM | POA: Diagnosis not present

## 2018-08-22 DIAGNOSIS — Z6841 Body Mass Index (BMI) 40.0 and over, adult: Secondary | ICD-10-CM | POA: Diagnosis not present

## 2018-08-22 DIAGNOSIS — Z0000 Encounter for general adult medical examination without abnormal findings: Secondary | ICD-10-CM | POA: Diagnosis not present

## 2018-08-22 DIAGNOSIS — R5383 Other fatigue: Secondary | ICD-10-CM | POA: Diagnosis not present

## 2018-08-22 DIAGNOSIS — Z7189 Other specified counseling: Secondary | ICD-10-CM | POA: Diagnosis not present

## 2018-08-22 DIAGNOSIS — Z1211 Encounter for screening for malignant neoplasm of colon: Secondary | ICD-10-CM | POA: Diagnosis not present

## 2018-08-22 DIAGNOSIS — E7800 Pure hypercholesterolemia, unspecified: Secondary | ICD-10-CM | POA: Diagnosis not present

## 2018-08-22 DIAGNOSIS — Z299 Encounter for prophylactic measures, unspecified: Secondary | ICD-10-CM | POA: Diagnosis not present

## 2018-08-22 DIAGNOSIS — Z1339 Encounter for screening examination for other mental health and behavioral disorders: Secondary | ICD-10-CM | POA: Diagnosis not present

## 2018-08-22 DIAGNOSIS — Z1331 Encounter for screening for depression: Secondary | ICD-10-CM | POA: Diagnosis not present

## 2018-08-22 DIAGNOSIS — F329 Major depressive disorder, single episode, unspecified: Secondary | ICD-10-CM | POA: Diagnosis not present

## 2018-08-22 DIAGNOSIS — Z79899 Other long term (current) drug therapy: Secondary | ICD-10-CM | POA: Diagnosis not present

## 2018-09-04 DIAGNOSIS — M170 Bilateral primary osteoarthritis of knee: Secondary | ICD-10-CM | POA: Diagnosis not present

## 2018-09-04 DIAGNOSIS — M1711 Unilateral primary osteoarthritis, right knee: Secondary | ICD-10-CM | POA: Diagnosis not present

## 2018-09-08 DIAGNOSIS — M545 Low back pain: Secondary | ICD-10-CM | POA: Diagnosis not present

## 2018-09-08 DIAGNOSIS — Z6841 Body Mass Index (BMI) 40.0 and over, adult: Secondary | ICD-10-CM | POA: Diagnosis not present

## 2018-09-08 DIAGNOSIS — G4730 Sleep apnea, unspecified: Secondary | ICD-10-CM | POA: Diagnosis not present

## 2018-09-08 DIAGNOSIS — J439 Emphysema, unspecified: Secondary | ICD-10-CM | POA: Diagnosis not present

## 2018-09-08 DIAGNOSIS — Z79899 Other long term (current) drug therapy: Secondary | ICD-10-CM | POA: Diagnosis not present

## 2018-09-08 DIAGNOSIS — Z299 Encounter for prophylactic measures, unspecified: Secondary | ICD-10-CM | POA: Diagnosis not present

## 2018-09-25 DIAGNOSIS — E8940 Asymptomatic postprocedural ovarian failure: Secondary | ICD-10-CM | POA: Diagnosis not present

## 2018-10-03 ENCOUNTER — Ambulatory Visit: Payer: Self-pay | Admitting: Internal Medicine

## 2018-10-04 ENCOUNTER — Ambulatory Visit: Payer: Self-pay | Admitting: Internal Medicine

## 2018-10-10 DIAGNOSIS — Z6841 Body Mass Index (BMI) 40.0 and over, adult: Secondary | ICD-10-CM | POA: Diagnosis not present

## 2018-10-10 DIAGNOSIS — Z299 Encounter for prophylactic measures, unspecified: Secondary | ICD-10-CM | POA: Diagnosis not present

## 2018-10-10 DIAGNOSIS — M549 Dorsalgia, unspecified: Secondary | ICD-10-CM | POA: Diagnosis not present

## 2018-10-10 DIAGNOSIS — J439 Emphysema, unspecified: Secondary | ICD-10-CM | POA: Diagnosis not present

## 2018-10-10 DIAGNOSIS — Z79899 Other long term (current) drug therapy: Secondary | ICD-10-CM | POA: Diagnosis not present

## 2018-10-12 ENCOUNTER — Encounter: Payer: Self-pay | Admitting: Internal Medicine

## 2018-10-12 ENCOUNTER — Ambulatory Visit: Payer: PPO | Admitting: Internal Medicine

## 2018-10-12 ENCOUNTER — Other Ambulatory Visit: Payer: Self-pay

## 2018-10-12 DIAGNOSIS — Z9989 Dependence on other enabling machines and devices: Secondary | ICD-10-CM

## 2018-10-12 DIAGNOSIS — G4733 Obstructive sleep apnea (adult) (pediatric): Secondary | ICD-10-CM

## 2018-10-12 DIAGNOSIS — J449 Chronic obstructive pulmonary disease, unspecified: Secondary | ICD-10-CM | POA: Diagnosis not present

## 2018-10-12 NOTE — Patient Instructions (Signed)
I will put a reminder to call you after the first of the year to set up full pfts   Call sooner if needed

## 2018-10-12 NOTE — Progress Notes (Signed)
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  Allergy testing in [place] 2014 neg per pt ? Dr Willa Rough?    History of Present Illness  07/03/2018  Pulmonary/ 1st office eval/Edra Riccardi  maint symbicort / singulair  Chief Complaint  Patient presents with  . Pulmonary Consult    Referred by Dr. Sherryll Burger for eval of COPD.    Dyspnea:  Slow pace does [employer] but can't do the whole store even slowly   = MMRC3 = can't walk 100 yards even at a slow pace at a flat grade s stopping due to sob / also limited by R > L knee  Cough: severe dry sporadic Sleep: cpap on L side 10 degrees bed blocks SABA use: non albuterol Last prednisone early June 2020  Has 02 not using  rec Be sure timolol is not an ingredient in your eyedrops Plan A = Automatic = Symbicort 160 Take 2 puffs first thing in am and then another 2 puffs about 12 hours later and singulair 10 mg each pm Work on inhaler technique:   Plan B = Backup Only use your albuterol(ventolin/proair)  inhaler  Plan C = Crisis - only use your albuterol-ipatropium  nebulizer if you first try Plan B and it fails to help > ok to use the nebulizer up to every 4 hours but if start needing it regularly call for immediate appointment Sign for Dr Henderson's records to include pfts Please schedule a follow up visit in 3 months but call sooner if needed  with all medications /inhalers/ solutions in hand so we can verify exactly what you are taking. This includes all medications from all doctors and over the  counters    10/12/2018  f/u ov/Zhamir Pirro re: obesity > asthma rx symb 160/  on pred/doxy per pcp Chief Complaint  Patient presents with  . Follow-up    Breathing has been worse "change in the weather". She has noticed some wheezing. She is using her proair inhaler once per wk on average and rarely uses neb.    Dyspnea:  Slowed by R knee > sob  Cough: no pattern but not noct or productive Sleeping: ok on cpap /min hob elevation SABA use: as above  02: no    No obvious day to day or daytime variability or assoc excess/ purulent sputum or mucus plugs or hemoptysis or cp or chest tightness,   or overt sinus or hb symptoms.   Sleeping as above  without nocturnal  or early am exacerbation  of respiratory  c/o's or need for noct saba. Also denies any obvious fluctuation of symptoms with weather or environmental changes or other aggravating or alleviating factors except as outlined above   No unusual exposure hx or h/o childhood pna/ asthma or knowledge of premature birth.  Current Allergies, Complete Past Medical History, Past Surgical History, Family History, and Social History were reviewed in [employer] record.  ROS  The following are not active  complaints unless bolded Hoarseness, sore throat, dysphagia, dental problems, itching, sneezing,  nasal congestion or discharge of excess mucus or purulent secretions, ear ache,   fever, chills, sweats, unintended wt loss or wt gain, classically pleuritic or exertional cp,  orthopnea pnd or arm/hand swelling  or leg swelling, presyncope, palpitations, abdominal pain, anorexia, nausea, vomiting, diarrhea  or change in bowel habits or change in bladder habits, change in stools or change in urine, dysuria, hematuria,  rash, arthralgias, visual complaints, headache, numbness, weakness or ataxia or problems with walking or coordination,  change in mood or  memory.        Current Meds  Medication Sig  . albuterol (PROAIR HFA) 108 (90  Base) MCG/ACT inhaler Inhale 2 puffs into the lungs every 6 (six) hours as needed for wheezing or shortness of breath.  Marland Kitchen albuterol (PROVENTIL) (2.5 MG/3ML) 0.083% nebulizer solution Inhale 3 mLs into the lungs every 6 (six) hours as needed. For shortness of breath  . azelastine (ASTELIN) 0.1 % nasal spray Place 2 sprays into both nostrils 2 (two) times daily. Use in each nostril as directed  . budesonide-formoterol (SYMBICORT) 160-4.5 MCG/ACT inhaler Inhale 2 puffs into the lungs 2 (two) times daily.  Marland Kitchen doxycycline (VIBRA-TABS) 100 MG tablet Take 100 mg by mouth 2 (two) times daily.  . fluticasone (FLONASE) 50 MCG/ACT nasal spray Place 2 sprays into both nostrils daily.  . furosemide (LASIX) 40 MG tablet Take 40 mg by mouth daily as needed.  . gabapentin (NEURONTIN) 300 MG capsule Take 300 mg by mouth 2 (two) times daily.  Marland Kitchen HYDROcodone-acetaminophen (NORCO) 7.5-325 MG tablet Take 1 tablet by mouth every 6 (six) hours as needed for moderate pain.  Marland Kitchen ipratropium-albuterol (DUONEB) 0.5-2.5 (3) MG/3ML SOLN Take 3 mLs by nebulization every 4 (four) hours as needed.  . Latanoprost 0.005 % EMUL Apply to eye.  Marland Kitchen LORazepam (ATIVAN) 1 MG tablet Take 1 mg by mouth 2 (two) times daily as needed. For anxiety  . montelukast (SINGULAIR) 10 MG tablet Take 10 mg by mouth at bedtime.  . potassium chloride (K-DUR) 10 MEQ tablet Take 20 mEq by mouth daily as needed.  . predniSONE (DELTASONE) 10 MG tablet Take 20 mg by mouth daily with breakfast.  . tiZANidine (ZANAFLEX) 4 MG tablet Take 4 mg by mouth every 6 (six) hours as needed for muscle spasms.              Objective:     amb obese wf   Wt Readings from Last 3 Encounters:  10/12/18 285 lb (129.3 kg)  08/02/18 292 lb (132.5 kg)  07/12/18 292 lb 0.5 oz (132.5 kg)     Vital signs reviewed - Note on arrival 02 sats  98% on RA     HEENT : pt wearing mask not removed for exam due to covid -19 concerns.    NECK :  without JVD/Nodes/TM/ nl carotid  upstrokes bilaterally   LUNGS: no acc muscle use,  Nl contour chest which is clear to A and P bilaterally without cough on insp or exp maneuvers   CV:  RRR  no s3 or murmur or increase in P2, and no edema   ABD:  Massively obese but soft and nontender with limited inspiratory excursion  . No bruits or organomegaly appreciated, bowel sounds nl  MS:  Nl gait/ ext warm without deformities, calf tenderness, cyanosis or clubbing No obvious joint restrictions   SKIN: warm and dry without lesions    NEURO:  alert, approp, nl sensorium with  no motor or cerebellar deficits apparent.                Assessment

## 2018-10-15 ENCOUNTER — Encounter: Payer: Self-pay | Admitting: Internal Medicine

## 2018-10-15 NOTE — Assessment & Plan Note (Signed)
Quit smoking 1993 - 07/03/2018  After extensive coaching inhaler device,  effectiveness =    75% > resume symb 160 2bid    - The proper method of use, as well as anticipated side effects, of a metered-dose inhaler are discussed and demonstrated to the patient.   Needs pfts to sort out obst vs restriction but no change in maint rx in meantime   I had an extended discussion with the patient reviewing all relevant studies completed to date and  lasting 15 to 20 minutes of a 25 minute visit    I performed detailed device teaching using a teach back method which extended face to face time for this visit (see above)  Each maintenance medication was reviewed in detail including emphasizing most importantly the difference between maintenance and prns and under what circumstances the prns are to be triggered using an action plan format that is not reflected in the computer generated alphabetically organized AVS which I have not found useful in most complex patients, especially with respiratory illnesses  Please see AVS for specific instructions unique to this visit that I personally wrote and verbalized to the the pt in detail and then reviewed with pt  by my nurse highlighting any  changes in therapy recommended at today's visit to their plan of care.

## 2018-10-15 NOTE — Assessment & Plan Note (Signed)
Adequate control on present rx, reviewed in detail with pt > no change in rx needed   

## 2018-10-15 NOTE — Assessment & Plan Note (Signed)
Body mass index is 53.85 kg/m.  -  trending slt down, encouraged  No results found for: TSH   Contributing to gerd risk/ doe/reviewed the need and the process to achieve and maintain neg calorie balance > defer f/u primary care including intermittently monitoring thyroid status

## 2018-10-17 ENCOUNTER — Encounter: Payer: Self-pay | Admitting: Cardiovascular Disease

## 2018-10-17 ENCOUNTER — Ambulatory Visit (INDEPENDENT_AMBULATORY_CARE_PROVIDER_SITE_OTHER): Payer: PPO | Admitting: Cardiovascular Disease

## 2018-10-17 ENCOUNTER — Other Ambulatory Visit: Payer: Self-pay

## 2018-10-17 VITALS — BP 176/81 | HR 58 | Ht 61.0 in | Wt 286.0 lb

## 2018-10-17 DIAGNOSIS — Z1231 Encounter for screening mammogram for malignant neoplasm of breast: Secondary | ICD-10-CM | POA: Diagnosis not present

## 2018-10-17 DIAGNOSIS — J449 Chronic obstructive pulmonary disease, unspecified: Secondary | ICD-10-CM

## 2018-10-17 DIAGNOSIS — R030 Elevated blood-pressure reading, without diagnosis of hypertension: Secondary | ICD-10-CM | POA: Diagnosis not present

## 2018-10-17 DIAGNOSIS — R600 Localized edema: Secondary | ICD-10-CM

## 2018-10-17 DIAGNOSIS — Z9989 Dependence on other enabling machines and devices: Secondary | ICD-10-CM | POA: Diagnosis not present

## 2018-10-17 DIAGNOSIS — G4733 Obstructive sleep apnea (adult) (pediatric): Secondary | ICD-10-CM | POA: Diagnosis not present

## 2018-10-17 NOTE — Patient Instructions (Signed)
Medication Instructions:  Continue all current medications.  Labwork: none  Testing/Procedures: none  Follow-Up: As needed.    Any Other Special Instructions Will Be Listed Below (If Applicable).  If you need a refill on your cardiac medications before your next appointment, please call your pharmacy.  

## 2018-10-17 NOTE — Progress Notes (Signed)
SUBJECTIVE: The patient presents for follow-up of bilateral leg edema.  She underwent a normal echocardiogram on 08/09/2018 with normal biventricular systolic function and normal left ventricular diastolic function.  Past medical history includes COPD, chronic pain and morbid obesity.  She has had no recurrence of leg edema.  Chronic exertional dyspnea is stable.  She just rushed here from another appointment and she knew her blood pressure would be elevated.   Social history: Her husband is disabled.  He has mental health issues and orthopedic issues.  She has an adult son who is an EMT [facility] [employer] [place].   Review of Systems: As per "subjective", otherwise negative.  No Known Allergies  Current Outpatient Medications  Medication Sig Dispense Refill  . albuterol (PROAIR HFA) 108 (90 Base) MCG/ACT inhaler Inhale 2 puffs into the lungs every 6 (six) hours as needed for wheezing or shortness of breath.    Marland Kitchen. albuterol (PROVENTIL) (2.5 MG/3ML) 0.083% nebulizer solution Inhale 3 mLs into the lungs every 6 (six) hours as needed. For shortness of breath    . budesonide-formoterol (SYMBICORT) 160-4.5 MCG/ACT inhaler Inhale 2 puffs into the lungs 2 (two) times daily.    . calcium carbonate (CALTRATE 600) 1500 (600 Ca) MG TABS tablet Take 2 tablets by mouth daily with breakfast.    . doxycycline (VIBRA-TABS) 100 MG tablet Take 100 mg by mouth 2 (two) times daily.    . fluticasone (FLONASE) 50 MCG/ACT nasal spray Place 2 sprays into both nostrils daily.    . furosemide (LASIX) 40 MG tablet Take 40 mg by mouth daily as needed.    . gabapentin (NEURONTIN) 300 MG capsule Take 300 mg by mouth 2 (two) times daily.    Marland Kitchen. HYDROcodone-acetaminophen (NORCO) 7.5-325 MG tablet Take 1 tablet by mouth every 6 (six) hours as needed for moderate pain.    Marland Kitchen. ipratropium-albuterol (DUONEB) 0.5-2.5 (3) MG/3ML SOLN Take 3 mLs by nebulization every 4 (four) hours as needed.    . Latanoprost 0.005 % EMUL  Apply to eye.    Marland Kitchen. LORazepam (ATIVAN) 1 MG tablet Take 1 mg by mouth 2 (two) times daily as needed. For anxiety    . montelukast (SINGULAIR) 10 MG tablet Take 10 mg by mouth at bedtime.    . potassium chloride (K-DUR) 10 MEQ tablet Take 20 mEq by mouth daily as needed.    . predniSONE (DELTASONE) 10 MG tablet Take 20 mg by mouth daily with breakfast.    . tiZANidine (ZANAFLEX) 4 MG tablet Take 4 mg by mouth every 6 (six) hours as needed for muscle spasms.     No current facility-administered medications for this visit.     Past Medical History:  Diagnosis Date  . Acute bronchitis with COPD (HCC)   . Allergic rhinitis   . Anxiety   . Asthma   . Cervical disc herniation   . Chest pain   . Chronic pain   . Edema   . Hyperlipidemia   . Lumbago   . Neck pain   . Osteoporosis   . Reflux esophagitis   . Rosacea   . Sleep apnea     Past Surgical History:  Procedure Laterality Date  . ABDOMINAL HYSTERECTOMY    . CHOLECYSTECTOMY    . MOUTH SURGERY    . TUBAL LIGATION      Social History   Socioeconomic History  . Marital status: Married    Spouse name: Not on file  . Number of children:  Not on file  . Years of education: Not on file  . Highest education level: Not on file  Occupational History  . Not on file  Social Needs  . Financial resource strain: Not on file  . Food insecurity    Worry: Not on file    Inability: Not on file  . Transportation needs    Medical: Not on file    Non-medical: Not on file  Tobacco Use  . Smoking status: Former Smoker    Packs/day: 1.00    Years: 15.00    Pack years: 15.00    Types: Cigarettes    Quit date: 01/05/1991    Years since quitting: 27.8  . Smokeless tobacco: Never Used  Substance and Sexual Activity  . Alcohol use: No  . Drug use: Not on file  . Sexual activity: Not on file  Lifestyle  . Physical activity    Days per week: Not on file    Minutes per session: Not on file  . Stress: Not on file  Relationships  .  Social Herbalist on phone: Not on file    Gets together: Not on file    Attends religious service: Not on file    Active member of club or organization: Not on file    Attends meetings of clubs or organizations: Not on file    Relationship status: Not on file  . Intimate partner violence    Fear of current or ex partner: Not on file    Emotionally abused: Not on file    Physically abused: Not on file    Forced sexual activity: Not on file  Other Topics Concern  . Not on file  Social History Narrative  . Not on file     Vitals:   10/17/18 1500  BP: (!) 176/81  Pulse: (!) 58  SpO2: 97%  Weight: 286 lb (129.7 kg)  Height: 5\' 1"  (1.549 m)    Wt Readings from Last 3 Encounters:  10/17/18 286 lb (129.7 kg)  10/12/18 285 lb (129.3 kg)  08/02/18 292 lb (132.5 kg)     PHYSICAL EXAM General: NAD, obese HEENT: Normal. Neck: No JVD, no thyromegaly. Lungs: Clear to auscultation bilaterally with normal respiratory effort. CV: Regular rate and rhythm, normal S1/S2, no S3/S4, no murmur. Trace bilateral peri-ankle edema.  Venous varicosities in bilateral lower extremities. Abdomen: Soft, nontender, no distention.  Neurologic: Alert and oriented.  Psych: Normal affect. Skin: Normal. Musculoskeletal: No gross deformities.      Labs: No results found for: K, BUN, CREATININE, ALT, TSH, HGB   Lipids: No results found for: LDLCALC, LDLDIRECT, CHOL, TRIG, HDL    Echocardiogram 08/09/2018:   1. The left ventricle has normal systolic function, with an ejection fraction of 55-60%. The cavity size was normal. There is mildly increased left ventricular wall thickness. Left ventricular diastolic parameters were normal.  2. The right ventricle has normal systolic function. The cavity was normal. There is no increase in right ventricular wall thickness. Right ventricular systolic pressure is normal with an estimated pressure of 15.1 mmHg.  3. The aortic valve was not well  visualized. Mild aortic annular calcification noted.  4. The mitral valve is grossly normal.  5. The tricuspid valve is grossly normal.  6. The aorta is normal in size and structure.  ASSESSMENT AND PLAN:  1.  Bilateral leg edema: No symptom recurrence.  Echocardiogram on 08/09/2018 which is reviewed above is entirely normal with normal biventricular systolic  function and normal left ventricular diastolic function.   This was likely the result of morbid obesity and venous varicosities, both of which would impede venous return and lead to leg swelling.  She continues to take Lasix as needed.  2.  Elevated blood pressure: She does not carry a diagnosis of hypertension.    It was elevated at her last visit as well.  She rushed to get here from another appointment.  3.  Morbid obesity: She need significant weight loss.  She would like to begin exercising in the gym when it reopens.  4.  Obstructive sleep apnea: Uses CPAP.  5.  COPD: She is being managed by pulmonary and is scheduled undergo PFTs.   Disposition: Follow up as needed   Prentice Docker, M.D., F.A.C.C.

## 2018-11-09 DIAGNOSIS — H401131 Primary open-angle glaucoma, bilateral, mild stage: Secondary | ICD-10-CM | POA: Diagnosis not present

## 2018-11-10 DIAGNOSIS — M549 Dorsalgia, unspecified: Secondary | ICD-10-CM | POA: Diagnosis not present

## 2018-11-10 DIAGNOSIS — Z299 Encounter for prophylactic measures, unspecified: Secondary | ICD-10-CM | POA: Diagnosis not present

## 2018-11-10 DIAGNOSIS — Z79899 Other long term (current) drug therapy: Secondary | ICD-10-CM | POA: Diagnosis not present

## 2018-11-10 DIAGNOSIS — J439 Emphysema, unspecified: Secondary | ICD-10-CM | POA: Diagnosis not present

## 2018-11-10 DIAGNOSIS — Z6841 Body Mass Index (BMI) 40.0 and over, adult: Secondary | ICD-10-CM | POA: Diagnosis not present

## 2018-11-10 DIAGNOSIS — F419 Anxiety disorder, unspecified: Secondary | ICD-10-CM | POA: Diagnosis not present

## 2018-12-04 DIAGNOSIS — M170 Bilateral primary osteoarthritis of knee: Secondary | ICD-10-CM | POA: Diagnosis not present

## 2018-12-11 DIAGNOSIS — Z6841 Body Mass Index (BMI) 40.0 and over, adult: Secondary | ICD-10-CM | POA: Diagnosis not present

## 2018-12-11 DIAGNOSIS — Z79899 Other long term (current) drug therapy: Secondary | ICD-10-CM | POA: Diagnosis not present

## 2018-12-11 DIAGNOSIS — J440 Chronic obstructive pulmonary disease with acute lower respiratory infection: Secondary | ICD-10-CM | POA: Diagnosis not present

## 2018-12-11 DIAGNOSIS — J439 Emphysema, unspecified: Secondary | ICD-10-CM | POA: Diagnosis not present

## 2018-12-11 DIAGNOSIS — Z299 Encounter for prophylactic measures, unspecified: Secondary | ICD-10-CM | POA: Diagnosis not present

## 2018-12-11 DIAGNOSIS — L0291 Cutaneous abscess, unspecified: Secondary | ICD-10-CM | POA: Diagnosis not present

## 2018-12-11 DIAGNOSIS — M545 Low back pain: Secondary | ICD-10-CM | POA: Diagnosis not present

## 2019-01-11 DIAGNOSIS — Z87891 Personal history of nicotine dependence: Secondary | ICD-10-CM | POA: Diagnosis not present

## 2019-01-11 DIAGNOSIS — Z79899 Other long term (current) drug therapy: Secondary | ICD-10-CM | POA: Diagnosis not present

## 2019-01-11 DIAGNOSIS — Z299 Encounter for prophylactic measures, unspecified: Secondary | ICD-10-CM | POA: Diagnosis not present

## 2019-01-11 DIAGNOSIS — J439 Emphysema, unspecified: Secondary | ICD-10-CM | POA: Diagnosis not present

## 2019-01-11 DIAGNOSIS — M549 Dorsalgia, unspecified: Secondary | ICD-10-CM | POA: Diagnosis not present

## 2019-01-11 DIAGNOSIS — Z6841 Body Mass Index (BMI) 40.0 and over, adult: Secondary | ICD-10-CM | POA: Diagnosis not present

## 2019-02-09 DIAGNOSIS — Z299 Encounter for prophylactic measures, unspecified: Secondary | ICD-10-CM | POA: Diagnosis not present

## 2019-02-09 DIAGNOSIS — M545 Low back pain: Secondary | ICD-10-CM | POA: Diagnosis not present

## 2019-02-09 DIAGNOSIS — G4733 Obstructive sleep apnea (adult) (pediatric): Secondary | ICD-10-CM | POA: Diagnosis not present

## 2019-02-09 DIAGNOSIS — Z6841 Body Mass Index (BMI) 40.0 and over, adult: Secondary | ICD-10-CM | POA: Diagnosis not present

## 2019-02-09 DIAGNOSIS — I10 Essential (primary) hypertension: Secondary | ICD-10-CM | POA: Diagnosis not present

## 2019-02-09 DIAGNOSIS — F419 Anxiety disorder, unspecified: Secondary | ICD-10-CM | POA: Diagnosis not present

## 2019-02-09 DIAGNOSIS — Z79899 Other long term (current) drug therapy: Secondary | ICD-10-CM | POA: Diagnosis not present

## 2019-02-09 DIAGNOSIS — E7800 Pure hypercholesterolemia, unspecified: Secondary | ICD-10-CM | POA: Diagnosis not present

## 2019-03-04 DIAGNOSIS — K219 Gastro-esophageal reflux disease without esophagitis: Secondary | ICD-10-CM | POA: Diagnosis not present

## 2019-03-04 DIAGNOSIS — J439 Emphysema, unspecified: Secondary | ICD-10-CM | POA: Diagnosis not present

## 2019-03-05 DIAGNOSIS — M170 Bilateral primary osteoarthritis of knee: Secondary | ICD-10-CM | POA: Diagnosis not present

## 2019-03-08 ENCOUNTER — Ambulatory Visit: Payer: PPO | Attending: Internal Medicine

## 2019-03-08 DIAGNOSIS — Z23 Encounter for immunization: Secondary | ICD-10-CM | POA: Insufficient documentation

## 2019-03-08 NOTE — Progress Notes (Signed)
   Covid-19 Vaccination Clinic  Name:  Jenna Rivera    MRN: [id number] DOB: May 24, 1960  03/08/2019  [name] was observed post Covid-19 immunization for 15 minutes without incident. She was provided with Vaccine Information Sheet and instruction to access the V-Safe system.   [name] was instructed to call 911 with any severe reactions post vaccine: Marland Kitchen Difficulty breathing  . Swelling of face and throat  . A fast heartbeat  . A bad rash all over body  . Dizziness and weakness   Immunizations Administered    Name Date Dose VIS Date Route   Moderna COVID-19 Vaccine 03/08/2019 12:39 PM 0.5 mL 12/05/2018 Intramuscular   Manufacturer: Moderna   Lot: [id number]   NDC: 10932-355-73

## 2019-03-13 DIAGNOSIS — G4730 Sleep apnea, unspecified: Secondary | ICD-10-CM | POA: Diagnosis not present

## 2019-03-13 DIAGNOSIS — M549 Dorsalgia, unspecified: Secondary | ICD-10-CM | POA: Diagnosis not present

## 2019-03-13 DIAGNOSIS — I10 Essential (primary) hypertension: Secondary | ICD-10-CM | POA: Diagnosis not present

## 2019-03-13 DIAGNOSIS — J439 Emphysema, unspecified: Secondary | ICD-10-CM | POA: Diagnosis not present

## 2019-03-13 DIAGNOSIS — R1310 Dysphagia, unspecified: Secondary | ICD-10-CM | POA: Diagnosis not present

## 2019-03-13 DIAGNOSIS — Z79899 Other long term (current) drug therapy: Secondary | ICD-10-CM | POA: Diagnosis not present

## 2019-03-13 DIAGNOSIS — Z299 Encounter for prophylactic measures, unspecified: Secondary | ICD-10-CM | POA: Diagnosis not present

## 2019-03-14 DIAGNOSIS — G4733 Obstructive sleep apnea (adult) (pediatric): Secondary | ICD-10-CM | POA: Diagnosis not present

## 2019-03-15 ENCOUNTER — Encounter: Payer: Self-pay | Admitting: Internal Medicine

## 2019-03-31 NOTE — Progress Notes (Signed)
Referring Provider: Kirstie Peri, MD Primary Care Physician:  Kirstie Peri, MD Primary Gastroenterologist:  Dr. Jena Gauss  Chief Complaint  Patient presents with  . Dysphagia    can even choke on saliva, got choked on chocolate before she came here    HPI:   Jenna Rivera is a 59 y.o. female presenting today at the request of Kirstie Peri, MD for dysphagia.   Today:  States she gets choked really easily. Got choked on chocolate before coming here. Felt like she couldn't breathe. Stated she coughed for 2 hours and finally got all of it up. Feels food or liquids will want to go to her lungs. Has to think about swallowing. Steadily worsening. Has been on and off for the last 5-6 years. Occurs at least once a week.   Pills will get stuck in her esophagus. Also trouble with chew substances like tootsie rolls. Will eat eat something solid to get the pills to go down. Rare trouble with course meats. No trouble with soft foods. No trouble with liquids. This has been present for "as long as I can remember."  No acid reflux. No nausea or vomiting. No regular abdominal pain. Had a colonoscopy in [place] at [facility] within the last 10 years. No polyps. Was told to repeat in 10 years. No blood in the stool. No melena. BMs daily. Typically mushy since cholecystectomy. Occasional days of diarrhea which she attributes to not having a gallbladder. Notes salad and greens will cause this. Not bothered by this. Will use imodium as needed.   No prior EGD.    Past Medical History:  Diagnosis Date  . Acute bronchitis with COPD (HCC)   . Allergic rhinitis   . Anxiety   . Asthma   . Cervical disc herniation   . Chest pain   . Chronic pain   . Edema   . Hyperlipidemia   . Lumbago   . Neck pain   . Osteoporosis   . Reflux esophagitis   . Rosacea   . Sleep apnea    uses CPAP    Past Surgical History:  Procedure Laterality Date  . ABDOMINAL HYSTERECTOMY    . CHOLECYSTECTOMY    . COLONOSCOPY      . MOUTH SURGERY    . TUBAL LIGATION      Current Outpatient Medications  Medication Sig Dispense Refill  . albuterol (PROAIR HFA) 108 (90 Base) MCG/ACT inhaler Inhale 2 puffs into the lungs every 6 (six) hours as needed for wheezing or shortness of breath.    Marland Kitchen albuterol (PROVENTIL) (2.5 MG/3ML) 0.083% nebulizer solution Inhale 3 mLs into the lungs every 6 (six) hours as needed. For shortness of breath    . budesonide-formoterol (SYMBICORT) 160-4.5 MCG/ACT inhaler Inhale 2 puffs into the lungs 2 (two) times daily.    . fluticasone (FLONASE) 50 MCG/ACT nasal spray Place 2 sprays into both nostrils daily.    . furosemide (LASIX) 40 MG tablet Take 20 mg by mouth daily as needed.     . gabapentin (NEURONTIN) 300 MG capsule Take 300 mg by mouth 2 (two) times daily.    Marland Kitchen HYDROcodone-acetaminophen (NORCO) 7.5-325 MG tablet Take 1 tablet by mouth in the morning, at noon, in the evening, and at bedtime.     Marland Kitchen ipratropium-albuterol (DUONEB) 0.5-2.5 (3) MG/3ML SOLN Take 3 mLs by nebulization every 4 (four) hours as needed.    . Latanoprost 0.005 % EMUL Apply to eye.    Marland Kitchen lisinopril (ZESTRIL) 5  MG tablet Take 5 mg by mouth daily.    Marland Kitchen LORazepam (ATIVAN) 1 MG tablet Take 1 mg by mouth daily. For anxiety    . montelukast (SINGULAIR) 10 MG tablet Take 10 mg by mouth at bedtime.    . potassium chloride (K-DUR) 10 MEQ tablet Take 20 mEq by mouth daily as needed.    Marland Kitchen tiZANidine (ZANAFLEX) 4 MG tablet Take 4 mg by mouth every 6 (six) hours as needed for muscle spasms.    . predniSONE (DELTASONE) 10 MG tablet Take 20 mg by mouth daily as needed.      No current facility-administered medications for this visit.    Allergies as of 04/02/2019  . (No Known Allergies)    Family History  Problem Relation Age of Onset  . COPD Mother   . Cancer Father   . Breast cancer Sister   . Lung cancer Sister   . Multiple sclerosis Sister   . Colon cancer Neg Hx        family history is limited.     Social  History   Socioeconomic History  . Marital status: Married    Spouse name: Not on file  . Number of children: Not on file  . Years of education: Not on file  . Highest education level: Not on file  Occupational History  . Not on file  Tobacco Use  . Smoking status: Former Smoker    Packs/day: 1.00    Years: 15.00    Pack years: 15.00    Types: Cigarettes    Quit date: 01/05/1991    Years since quitting: 28.2  . Smokeless tobacco: Never Used  Substance and Sexual Activity  . Alcohol use: No    Comment: occasional  . Drug use: Never  . Sexual activity: Not on file  Other Topics Concern  . Not on file  Social History Narrative  . Not on file   Social Determinants of Health   Financial Resource Strain:   . Difficulty of Paying Living Expenses:   Food Insecurity:   . Worried About Programme researcher, broadcasting/film/video in the Last Year:   . Barista in the Last Year:   Transportation Needs:   . Freight forwarder (Medical):   Marland Kitchen Lack of Transportation (Non-Medical):   Physical Activity:   . Days of Exercise per Week:   . Minutes of Exercise per Session:   Stress:   . Feeling of Stress :   Social Connections:   . Frequency of Communication with Friends and Family:   . Frequency of Social Gatherings with Friends and Family:   . Attends Religious Services:   . Active Member of Clubs or Organizations:   . Attends Banker Meetings:   Marland Kitchen Marital Status:   Intimate Partner Violence:   . Fear of Current or Ex-Partner:   . Emotionally Abused:   Marland Kitchen Physically Abused:   . Sexually Abused:     Review of Systems: Gen: Denies any fever, chills, cold or flulike symptoms, lightheadedness, dizziness, presyncope, syncope. CV: Denies chest pain or heart palpitations.  Resp: Some chronic intermittent shortness of breath with exertion related to COPD. Also with intermittent cough.   GI:  See HPI GU : Denies urinary burning, urinary frequency, urinary hesitancy MS: Chronic knee,  lower back, and neck pain.  Derm: Denies rash Heme: Denies bruising or bleeding  Physical Exam: BP (!) 154/59   Pulse 60   Temp (!) 97.1 F (36.2 C) (  Oral)   Ht 5\' 1"  (1.549 m)   Wt 282 lb 6.4 oz (128.1 kg)   BMI 53.36 kg/m  General:   Alert and oriented. Pleasant and cooperative. Well-nourished and well-developed.  Head:  Normocephalic and atraumatic. Eyes:  Without icterus, sclera clear and conjunctiva pink.  Ears:  Normal auditory acuity. Lungs:  Clear to auscultation bilaterally. No wheezes, rales, or rhonchi. No distress.  Heart:  S1, S2 present without murmurs appreciated.  Abdomen:  +BS, soft, non-tender and non-distended. No HSM noted. No guarding or rebound. No masses appreciated.  Rectal:  Deferred  Msk:  Symmetrical without gross deformities. Normal posture. Extremities:  Without edema. Neurologic:  Alert and  oriented x4;  grossly normal neurologically. Skin:  Intact without significant lesions or rashes. Psych: Normal mood and affect.

## 2019-04-02 ENCOUNTER — Encounter: Payer: Self-pay | Admitting: Gastroenterology

## 2019-04-02 ENCOUNTER — Other Ambulatory Visit: Payer: Self-pay

## 2019-04-02 ENCOUNTER — Ambulatory Visit: Payer: PPO | Admitting: Gastroenterology

## 2019-04-02 DIAGNOSIS — R1310 Dysphagia, unspecified: Secondary | ICD-10-CM

## 2019-04-02 NOTE — Assessment & Plan Note (Addendum)
59 year old female reporting pill dysphagia and dysphagia to chewy substances like to tootsie rolls for "as long as I can remember" without any significant change. Feels these items will get hung around her sternal notch.  Occasional trouble with coarse meats.  No regurgitation on these items.  Additionally, she reports 5-6-year history of progressive symptoms of foods and liquids wanting to go into her lungs.  Occurring at least once a week and states she has to consciously think about swallowing.  Denies reflux symptoms.  I am most concerned for possible aspiration and feel patient needs to have evaluation with speech therapy for formal MBSS prior to any EGD evaluation.  Suspect she may have some underlying muscle weakness or motility disorder contributing.  Cannot rule out cervical web as a contributor to esophageal dysphagia, but again, I feel possible aspiration needs to be addressed first.  Refer to SLP for MBSS ASAP. Advise she sit upright when eating, eat slowly, chew well, chop meats finely, and try chin tuck when swallowing. Additionally, she was advised if something were to get stuck in her esophagus and not come up or go down, she should proceed to the emergency room. Follow-up in 3 months.  Call with questions or concerns prior.

## 2019-04-02 NOTE — Patient Instructions (Addendum)
We will get you referred to speech therapy for them to complete a swallow evaluation.  I am concerned for possible motility disorder and aspiration.   Be sure to sit upright when eating, eat slowly, chew well, chopped meats finely, and try tucking your chin when swallowing.  When we see you back in the office, we will revisit whether or not an upper endoscopy is needed.  In the meantime, if something were to get stuck in your esophagus and not come up or go down, you should proceed to the emergency room.  We will plan to follow-up with you in 3 months.  Call with questions or concerns prior.  Ermalinda Memos, PA-C [facility]

## 2019-04-03 ENCOUNTER — Other Ambulatory Visit (HOSPITAL_COMMUNITY): Payer: Self-pay | Admitting: Specialist

## 2019-04-03 DIAGNOSIS — R1313 Dysphagia, pharyngeal phase: Secondary | ICD-10-CM

## 2019-04-03 DIAGNOSIS — T17908S Unspecified foreign body in respiratory tract, part unspecified causing other injury, sequela: Secondary | ICD-10-CM

## 2019-04-04 DIAGNOSIS — R040 Epistaxis: Secondary | ICD-10-CM | POA: Diagnosis not present

## 2019-04-04 DIAGNOSIS — J441 Chronic obstructive pulmonary disease with (acute) exacerbation: Secondary | ICD-10-CM | POA: Diagnosis not present

## 2019-04-10 ENCOUNTER — Other Ambulatory Visit: Payer: Self-pay

## 2019-04-10 ENCOUNTER — Ambulatory Visit (HOSPITAL_COMMUNITY): Payer: PPO | Attending: Pediatrics | Admitting: Speech Pathology

## 2019-04-10 ENCOUNTER — Encounter (HOSPITAL_COMMUNITY): Payer: Self-pay | Admitting: Speech Pathology

## 2019-04-10 ENCOUNTER — Ambulatory Visit: Payer: PPO | Attending: Internal Medicine

## 2019-04-10 ENCOUNTER — Ambulatory Visit (HOSPITAL_COMMUNITY)
Admission: RE | Admit: 2019-04-10 | Discharge: 2019-04-10 | Disposition: A | Discharge status: Home / Self Care | Payer: PPO | Source: Ambulatory Visit | Attending: Pediatrics | Admitting: Pediatrics

## 2019-04-10 DIAGNOSIS — T17300A Unspecified foreign body in larynx causing asphyxiation, initial encounter: Secondary | ICD-10-CM | POA: Diagnosis not present

## 2019-04-10 DIAGNOSIS — Z23 Encounter for immunization: Secondary | ICD-10-CM

## 2019-04-10 DIAGNOSIS — R1310 Dysphagia, unspecified: Secondary | ICD-10-CM | POA: Diagnosis not present

## 2019-04-10 DIAGNOSIS — R1312 Dysphagia, oropharyngeal phase: Secondary | ICD-10-CM | POA: Insufficient documentation

## 2019-04-10 DIAGNOSIS — T17908S Unspecified foreign body in respiratory tract, part unspecified causing other injury, sequela: Secondary | ICD-10-CM | POA: Diagnosis not present

## 2019-04-10 DIAGNOSIS — R1313 Dysphagia, pharyngeal phase: Secondary | ICD-10-CM | POA: Diagnosis not present

## 2019-04-10 NOTE — Progress Notes (Signed)
   Covid-19 Vaccination Clinic  Name:  Jenna Rivera    MRN: [id number] DOB: 1960-10-29  04/10/2019  [name] was observed post Covid-19 immunization for 15 minutes without incident. She was provided with Vaccine Information Sheet and instruction to access the V-Safe system.   [name] was instructed to call 911 with any severe reactions post vaccine: Marland Kitchen Difficulty breathing  . Swelling of face and throat  . A fast heartbeat  . A bad rash all over body  . Dizziness and weakness   Immunizations Administered    Name Date Dose VIS Date Route   Moderna COVID-19 Vaccine 04/10/2019 11:04 AM 0.5 mL 12/05/2018 Intramuscular   Manufacturer: Gala Murdoch   Lot: [id number]   NDC: 99689-570-22

## 2019-04-10 NOTE — Therapy (Signed)
[facility] [address] [place], [place], [address] Phone: [phone]   Fax:  [phone]  Modified Barium Swallow  Patient Details  Name: Jenna Rivera MRN: [id number] Date of Birth: 07-02-1960 No data recorded  Encounter Date: 04/10/2019  End of Session - 04/10/19 1456    Visit Number  1    Number of Visits  1    Authorization Type  [employer]    SLP Start Time  1330    SLP Stop Time   1400    SLP Time Calculation (min)  30 min    Activity Tolerance  Patient tolerated treatment well       Past Medical History:  Diagnosis Date  . Acute bronchitis with COPD (HCC)   . Allergic rhinitis   . Anxiety   . Asthma   . Cervical disc herniation   . Chest pain   . Chronic pain   . Edema   . Hyperlipidemia   . Lumbago   . Neck pain   . Osteoporosis   . Reflux esophagitis   . Rosacea   . Sleep apnea    uses CPAP    Past Surgical History:  Procedure Laterality Date  . ABDOMINAL HYSTERECTOMY    . CHOLECYSTECTOMY    . COLONOSCOPY    . MOUTH SURGERY    . TUBAL LIGATION      There were no vitals filed for this visit.  Subjective Assessment - 04/10/19 1428    Subjective  "I get choked on my saliva and had some chocolate go down into my lungs."    Special Tests  MBSS    Currently in Pain?  No/denies        General - 04/10/19 1429      General Information   Date of Onset  04/02/19    HPI  Jenna Rivera is a 59 yo female who was referred for MBSS by Ermalinda Memos, PA due to Pt with reports of difficulty swallowing the center of a tootsie pop, other chocolates, and getting "choked" on her saliva.    Type of Study  MBS-Modified Barium Swallow Study    Previous Swallow Assessment  None    Diet Prior to this Study  Regular;Thin liquids    Temperature Spikes Noted  No    Respiratory Status  Room air    History of Recent Intubation  No    Behavior/Cognition  Alert;Cooperative;Pleasant mood    Oral Cavity  Assessment  Within Functional Limits    Oral Care Completed by SLP  No    Oral Cavity - Dentition  Dentures, top;Dentures, bottom    Vision  Functional for self feeding    Self-Feeding Abilities  Able to feed self    Patient Positioning  Upright in chair    Baseline Vocal Quality  Normal    Volitional Cough  Strong    Volitional Swallow  Able to elicit    Anatomy  Within functional limits    Pharyngeal Secretions  Not observed secondary MBS         Oral Preparation/Oral Phase - 04/10/19 1432      Oral Preparation/Oral Phase   Oral Phase  Within functional limits   piecemeal deglutition noted with puree and solids     Electrical stimulation - Oral Phase   Was Electrical Stimulation Used  No       Pharyngeal Phase - 04/10/19 1432      Pharyngeal Phase   Pharyngeal  Phase  Impaired      Pharyngeal - Thin   Pharyngeal- Thin Teaspoon  Within functional limits    Pharyngeal- Thin Cup  Within functional limits    Pharyngeal- Thin Straw  Within functional limits;Penetration/Aspiration during swallow    Pharyngeal  Material does not enter airway;Material enters airway, remains ABOVE vocal cords then ejected out      Pharyngeal - Solids   Pharyngeal- Puree  Within functional limits    Pharyngeal- Regular  Within functional limits    Pharyngeal- Pill  Within functional limits      Electrical Stimulation - Pharyngeal Phase   Was Electrical Stimulation Used  No       Cricopharyngeal Phase - 04/10/19 1433      Cervical Esophageal Phase   Cervical Esophageal Phase  Within functional limits   brief stasis of barium tablet in distal esophagus, but cleared with liquid wash       Plan - 04/10/19 1530    Clinical Impression Statement  Pt presents with normal oropharyngeal swallow when assessed in the lateral position with barium tinged thin (tsp/cup/straw), puree, regular textures, and barium tablet. Pt with min piecemeal deglutition with semisolids and solids. Swallow initiation  was timely and hyolaryngeal excursion WNL. Pt with flash penetration of thins during the swallow without aspiration when taking sequential straw sips of thins. Pt reports episodic bouts of "choking" on saliva and an episode where she is certain she "aspirated" some chocolate. SLP explained that this test only shows a brief moment in time, however no weakness observed to explain her symptoms. Pt endorses COPD and SLP explained the swallow/respiration reciprocity and risks for aspiration in individuals with COPD. Pt may experience some reduced pharyngeal sensation given use of CPAP at night and she might have saliva pool which may lead to coughing. She was encouraged to swallow more frequently and especially before she speaks. No further SLP services indicated at this time.     Consulted and Agree with Plan of Care  Patient       Patient will benefit from skilled therapeutic intervention in order to improve the following deficits and impairments:   Dysphagia, oropharyngeal phase    Recommendations/Treatment - 04/10/19 1434      Swallow Evaluation Recommendations   SLP Diet Recommendations  Thin;Age appropriate regular    Liquid Administration via  Cup;Straw    Medication Administration  Whole meds with liquid    Supervision  Patient able to self feed    Postural Changes  Seated upright at 90 degrees;Remain upright for at least 30 minutes after feeds/meals       Prognosis - 04/10/19 1434      Prognosis   Prognosis for Safe Diet Advancement  Good    Barriers/Prognosis Comment  COPD      Individuals Consulted   Consulted and Agree with Results and Recommendations  Patient    Report Sent to   Referring physician       Problem List Patient Active Problem List   Diagnosis Date Noted  . Dysphagia 04/02/2019  . Morbid (severe) obesity due to excess calories ([place]) 07/04/2018  . OSA on CPAP 07/04/2018  . COPD / pfts pending   07/03/2018   Thank you,  Genene Churn,  [place]  [facility] 04/10/2019, 3:32 PM  [facility] [address] [place], [place], [address] Phone: [phone]   Fax:  [phone]  Name: Jenna Rivera MRN: [id number] Date of Birth: [birth year]

## 2019-04-13 DIAGNOSIS — Z6841 Body Mass Index (BMI) 40.0 and over, adult: Secondary | ICD-10-CM | POA: Diagnosis not present

## 2019-04-13 DIAGNOSIS — J9611 Chronic respiratory failure with hypoxia: Secondary | ICD-10-CM | POA: Diagnosis not present

## 2019-04-13 DIAGNOSIS — Z299 Encounter for prophylactic measures, unspecified: Secondary | ICD-10-CM | POA: Diagnosis not present

## 2019-04-13 DIAGNOSIS — F1120 Opioid dependence, uncomplicated: Secondary | ICD-10-CM | POA: Diagnosis not present

## 2019-04-13 DIAGNOSIS — I10 Essential (primary) hypertension: Secondary | ICD-10-CM | POA: Diagnosis not present

## 2019-04-13 DIAGNOSIS — M545 Low back pain: Secondary | ICD-10-CM | POA: Diagnosis not present

## 2019-04-13 DIAGNOSIS — Z79899 Other long term (current) drug therapy: Secondary | ICD-10-CM | POA: Diagnosis not present

## 2019-04-13 DIAGNOSIS — J439 Emphysema, unspecified: Secondary | ICD-10-CM | POA: Diagnosis not present

## 2019-04-13 DIAGNOSIS — Z87891 Personal history of nicotine dependence: Secondary | ICD-10-CM | POA: Diagnosis not present

## 2019-05-04 DIAGNOSIS — I10 Essential (primary) hypertension: Secondary | ICD-10-CM | POA: Diagnosis not present

## 2019-05-04 DIAGNOSIS — R040 Epistaxis: Secondary | ICD-10-CM | POA: Diagnosis not present

## 2019-05-07 DIAGNOSIS — M170 Bilateral primary osteoarthritis of knee: Secondary | ICD-10-CM | POA: Diagnosis not present

## 2019-05-10 DIAGNOSIS — H401131 Primary open-angle glaucoma, bilateral, mild stage: Secondary | ICD-10-CM | POA: Diagnosis not present

## 2019-05-14 DIAGNOSIS — Z79899 Other long term (current) drug therapy: Secondary | ICD-10-CM | POA: Diagnosis not present

## 2019-05-14 DIAGNOSIS — F1120 Opioid dependence, uncomplicated: Secondary | ICD-10-CM | POA: Diagnosis not present

## 2019-05-14 DIAGNOSIS — I10 Essential (primary) hypertension: Secondary | ICD-10-CM | POA: Diagnosis not present

## 2019-05-14 DIAGNOSIS — J439 Emphysema, unspecified: Secondary | ICD-10-CM | POA: Diagnosis not present

## 2019-05-14 DIAGNOSIS — Z299 Encounter for prophylactic measures, unspecified: Secondary | ICD-10-CM | POA: Diagnosis not present

## 2019-05-14 DIAGNOSIS — Z713 Dietary counseling and surveillance: Secondary | ICD-10-CM | POA: Diagnosis not present

## 2019-05-14 DIAGNOSIS — M545 Low back pain: Secondary | ICD-10-CM | POA: Diagnosis not present

## 2019-05-17 DIAGNOSIS — H401131 Primary open-angle glaucoma, bilateral, mild stage: Secondary | ICD-10-CM | POA: Diagnosis not present

## 2019-06-06 DIAGNOSIS — M170 Bilateral primary osteoarthritis of knee: Secondary | ICD-10-CM | POA: Diagnosis not present

## 2019-06-07 DIAGNOSIS — G4733 Obstructive sleep apnea (adult) (pediatric): Secondary | ICD-10-CM | POA: Diagnosis not present

## 2019-06-15 DIAGNOSIS — M2550 Pain in unspecified joint: Secondary | ICD-10-CM | POA: Diagnosis not present

## 2019-06-15 DIAGNOSIS — Z299 Encounter for prophylactic measures, unspecified: Secondary | ICD-10-CM | POA: Diagnosis not present

## 2019-06-15 DIAGNOSIS — J439 Emphysema, unspecified: Secondary | ICD-10-CM | POA: Diagnosis not present

## 2019-06-15 DIAGNOSIS — I10 Essential (primary) hypertension: Secondary | ICD-10-CM | POA: Diagnosis not present

## 2019-06-15 DIAGNOSIS — Z6841 Body Mass Index (BMI) 40.0 and over, adult: Secondary | ICD-10-CM | POA: Diagnosis not present

## 2019-06-15 DIAGNOSIS — Z79899 Other long term (current) drug therapy: Secondary | ICD-10-CM | POA: Diagnosis not present

## 2019-06-21 DIAGNOSIS — H401131 Primary open-angle glaucoma, bilateral, mild stage: Secondary | ICD-10-CM | POA: Diagnosis not present

## 2019-07-03 NOTE — Progress Notes (Signed)
Referring Provider: Kirstie Peri, MD Primary Care Physician:  Kirstie Peri, MD Primary GI Physician: Dr. Jena Gauss  Chief Complaint  Patient presents with  . Dysphagia    food/pills    HPI:   Jenna Rivera is a 59 y.o. female presenting today for follow-up of dysphagia.   Last seen in our office 3/39/21 for the same.  Reported pill dysphagia and stated to chew the substances like to 29-year-old for "as long as I can remember" without any significant change.  "I am getting hung around sternal notch.  Occasional trouble for sneezing.  No regurgitation.  Also reported 5-6-year history of progressive symptoms of foods and liquids wanting to come to pulmonologist.  States she had to consciously think about swallowing.  Denies reflux symptoms or any other significant upper GI symptoms.  Was concerned about possible aspiration and recommended MBSS  prior to EGD.  We will see her back in 3 months to discuss scheduling EGD.  Patient saw SLP 04/10/2019 with MBSS.  Normal oropharyngeal swallow.  Minimal piecemeal deglutition with semisolids and solids.  Swallow initiation timely and hyolaryngeal excursion within normal limits.  Flash penetration of thins during swallow without aspiration when taking sequential straw sips of thins.  Patient was encouraged to swallow more frequently and especially before she speaks.  No further SLP services indicated.  Today: Pills and solid foods get lodged at the sternal notch.  Occurring most days. Occasional pill regurgitation. Soft foods and liquids go down ok. No further episodes of feeling she has aspirated. Very rare GERD symptoms. Not even once a month. No N/V.   Doesn't have a gallbladder. Intermittent diarrhea since cholecystectomy in the 90s. Can have 7-8 watery BMs in a day when diarrhea occurs. Abdominal pain prior to BMs that resolves thereafter. Also with gas/bloating. Last week she had diarrhea. She ate [employer] prior to onset. Diarrhea last a few days at a  time. Will use imodium as needed. Probiotics as needed. Sometimes it helps with the diarrhea. They are expensive. Having soft, formed BMs now. 2 a day. Occasional cheese, drinks soy milk, occasional ice cream. Typically BMs are postprandial. Worse with coffee. No nocturnal symptoms. No blood in the stool. Some toilet tissue hematochezia if frequent diarrhea. Reports having small hemorrhoids. Will use witch hazel/barrier ointment which resolves symptoms. No melena.   Remembers a medication that she mixed in her water to help with diarrhea but this constipated her.   Reports last TCS at [facility] about 7 years. Was told to repeat in 10 years.   Weight is stable.   Has physical in August. Will have labs completed then.  Past Medical History:  Diagnosis Date  . Acute bronchitis with COPD (HCC)   . Allergic rhinitis   . Anxiety   . Asthma   . Cervical disc herniation   . Chest pain   . Chronic pain   . COPD (chronic obstructive pulmonary disease) (HCC)   . Edema   . HTN (hypertension)   . Hyperlipidemia   . Lumbago   . Neck pain   . Osteoporosis   . Reflux esophagitis   . Rosacea   . Sleep apnea    uses CPAP    Past Surgical History:  Procedure Laterality Date  . ABDOMINAL HYSTERECTOMY    . CHOLECYSTECTOMY     1990s  . COLONOSCOPY     [place]  . MOUTH SURGERY    . TUBAL LIGATION      Current Outpatient Medications  Medication Sig Dispense Refill  . albuterol (PROAIR HFA) 108 (90 Base) MCG/ACT inhaler Inhale 2 puffs into the lungs every 6 (six) hours as needed for wheezing or shortness of breath.    Marland Kitchen albuterol (PROVENTIL) (2.5 MG/3ML) 0.083% nebulizer solution Inhale 3 mLs into the lungs every 6 (six) hours as needed. For shortness of breath    . budesonide-formoterol (SYMBICORT) 160-4.5 MCG/ACT inhaler Inhale 2 puffs into the lungs 2 (two) times daily.    . fluticasone (FLONASE) 50 MCG/ACT nasal spray Place 2 sprays into both nostrils daily.    . furosemide (LASIX) 40 MG  tablet Take 20 mg by mouth daily as needed.     . gabapentin (NEURONTIN) 300 MG capsule Take 300 mg by mouth 2 (two) times daily.    Marland Kitchen HYDROcodone-acetaminophen (NORCO) 7.5-325 MG tablet Take 1 tablet by mouth in the morning, at noon, in the evening, and at bedtime.     Marland Kitchen ipratropium-albuterol (DUONEB) 0.5-2.5 (3) MG/3ML SOLN Take 3 mLs by nebulization every 4 (four) hours as needed.    . Latanoprost 0.005 % EMUL Apply to eye.    Marland Kitchen lisinopril (ZESTRIL) 5 MG tablet Take 5 mg by mouth daily.    Marland Kitchen LORazepam (ATIVAN) 1 MG tablet Take 1 mg by mouth daily. For anxiety    . montelukast (SINGULAIR) 10 MG tablet Take 10 mg by mouth at bedtime.    . potassium chloride (K-DUR) 10 MEQ tablet Take 20 mEq by mouth daily as needed.    . predniSONE (DELTASONE) 10 MG tablet Take 20 mg by mouth daily as needed.     Marland Kitchen tiZANidine (ZANAFLEX) 4 MG tablet Take 4 mg by mouth every 6 (six) hours as needed for muscle spasms.    Marland Kitchen dicyclomine (BENTYL) 10 MG capsule Take 1 capsule (10 mg total) by mouth 4 (four) times daily -  before meals and at bedtime. 120 capsule 2   No current facility-administered medications for this visit.    Allergies as of 07/04/2019  . (No Known Allergies)    Family History  Problem Relation Age of Onset  . COPD Mother   . Cancer Father   . Breast cancer Sister   . Lung cancer Sister   . Multiple sclerosis Sister   . Colon cancer Neg Hx        family history is limited.     Social History   Socioeconomic History  . Marital status: Married    Spouse name: Not on file  . Number of children: Not on file  . Years of education: Not on file  . Highest education level: Not on file  Occupational History  . Not on file  Tobacco Use  . Smoking status: Former Smoker    Packs/day: 1.00    Years: 15.00    Pack years: 15.00    Types: Cigarettes    Quit date: 01/05/1991    Years since quitting: 28.5  . Smokeless tobacco: Never Used  Vaping Use  . Vaping Use: Never used  Substance  and Sexual Activity  . Alcohol use: Yes    Comment: occasional  . Drug use: Never  . Sexual activity: Not on file  Other Topics Concern  . Not on file  Social History Narrative  . Not on file   Social Determinants of Health   Financial Resource Strain:   . Difficulty of Paying Living Expenses:   Food Insecurity:   . Worried About Programme researcher, broadcasting/film/video in the Last Year:   .  Ran Out of Food in the Last Year:   Transportation Needs:   . Freight forwarder (Medical):   Marland Kitchen Lack of Transportation (Non-Medical):   Physical Activity:   . Days of Exercise per Week:   . Minutes of Exercise per Session:   Stress:   . Feeling of Stress :   Social Connections:   . Frequency of Communication with Friends and Family:   . Frequency of Social Gatherings with Friends and Family:   . Attends Religious Services:   . Active Member of Clubs or Organizations:   . Attends Banker Meetings:   Marland Kitchen Marital Status:     Review of Systems: Gen: Denies fever, chills, lightheadedness, dizziness, presyncope, syncope CV: Denies chest pain or palpitations Resp: Admits to SOB with exertion and regular cough secondary to COPD. Used to be on oxygen. Not requiring this now. CPAP at night. GI: See HPI Heme: See HPI  Physical Exam: BP (!) 132/52   Pulse 61   Temp (!) 97 F (36.1 C) (Oral)   Ht 5\' 1"  (1.549 m)   Wt 282 lb 12.8 oz (128.3 kg)   BMI 53.43 kg/m  General:   Alert and oriented. No distress noted. Pleasant and cooperative.  Uses a cane. Head:  Normocephalic and atraumatic. Eyes:  Conjuctiva clear without scleral icterus. Heart: Distant heart sounds due to body habitus.  S1, S2 present without murmurs appreciated. Lungs:  Clear to auscultation bilaterally. No wheezes, rales, or rhonchi. No distress.  Abdomen:  +BS, soft, non-tender and non-distended. No rebound or guarding. No HSM or masses noted. Msk:  Symmetrical without gross deformities. Normal posture. Extremities:  Without  edema. Neurologic:  Alert and  oriented x4 Psych:  Normal mood and affect.

## 2019-07-04 ENCOUNTER — Other Ambulatory Visit: Payer: Self-pay

## 2019-07-04 ENCOUNTER — Ambulatory Visit: Payer: PPO | Admitting: Gastroenterology

## 2019-07-04 ENCOUNTER — Encounter: Payer: Self-pay | Admitting: Gastroenterology

## 2019-07-04 VITALS — BP 132/52 | HR 61 | Temp 97.0°F | Ht 61.0 in | Wt 282.8 lb

## 2019-07-04 DIAGNOSIS — R197 Diarrhea, unspecified: Secondary | ICD-10-CM

## 2019-07-04 DIAGNOSIS — R1310 Dysphagia, unspecified: Secondary | ICD-10-CM | POA: Diagnosis not present

## 2019-07-04 DIAGNOSIS — E7800 Pure hypercholesterolemia, unspecified: Secondary | ICD-10-CM | POA: Diagnosis not present

## 2019-07-04 DIAGNOSIS — R040 Epistaxis: Secondary | ICD-10-CM | POA: Diagnosis not present

## 2019-07-04 MED ORDER — DICYCLOMINE HCL 10 MG PO CAPS: 10.0000 mg | ORAL_CAPSULE | Freq: Three times a day (TID) | ORAL | 2 refills | Status: DC

## 2019-07-04 NOTE — Assessment & Plan Note (Addendum)
59 y.o female with 5-6 year history of solid food and pill dysphagia with sensation of items getting lodged at the sternal notch. Symptoms are daily. Occasionally requiring pill regurgitation. No trouble with soft foods or liquids. No regular GERD symptoms. Patient had been quite concerned about possible intermittent aspiration previously. She completed MBSS in April 2021 normal oropharyngeal swallow.  Minimal piecemeal deglutition with semisolids and solids.  Swallow initiation timely and hyolaryngeal excursion within normal limits.  Flash penetration of thins during swallow without aspiration when taking sequential straw sips of thins.  Patient was encouraged to swallow more frequently and especially before she speaks.  No further SLP services indicated.   At this point, patient needs EGD with possible dilation of her esophagus for further evaluation.  Suspect possible esophageal web, ring, or stricture.  Less likely malignancy.  Procedure EGD +/- dilation with propofol with Dr. Jena Gauss in the future. The risks, benefits, and alternatives have been discussed in detail with patient. They have stated understanding and desire to proceed.  Propofol due to polypharmacy and BMI. Take small bites, chew thoroughly, and drink plenty of liquids throughout meals. If anything were to get hung in her esophagus and not come up or go down, she was advised to proceed to the emergency room. Follow-up after EGD.

## 2019-07-04 NOTE — Patient Instructions (Addendum)
We will get you scheduled for an upper endoscopy with possible dilation of your esophagus in the near future with Dr. Jena Gauss.  Should anything get hung in your esophagus and not come up or go down, you should proceed to the emergency room.  Continue to take small bites, chew thoroughly, and drink plenty of fluids throughout your meals.  Please have labs completed at [facility].  I am checking for celiac disease and also updating her thyroid function.  I am sending in Bentyl 10 mg.  You may take this up to 3 times daily before meals and at bedtime as needed for diarrhea and abdominal cramping.  I recommend you start taking once a day before breakfast take all the starts. Hold in the setting of constipation.   Avoid items that can cause gas.  These include broccoli, cabbage, cauliflower, Brussels sprouts, beans, artificial sweeteners, chewing gum, drinking through a straw, soda, carbonated beverages.  I will try to request your prior colonoscopy records.  When you had labs completed in August, please send our office.  We will see you back after your upper endoscopy.  Do not hesitate to call with questions or concerns prior.  Ermalinda Memos, PA-C [facility]   Abdominal Bloating When you have abdominal bloating, your abdomen may feel full, tight, or painful. It may also look bigger than normal or swollen (distended). Common causes of abdominal bloating include:  Swallowing air.  Constipation.  Problems digesting food.  Eating too much.  Irritable bowel syndrome. This is a condition that affects the large intestine.  Lactose intolerance. This is an inability to digest lactose, a natural sugar in dairy products.  Celiac disease. This is a condition that affects the ability to digest gluten, a protein found in some grains.  Gastroparesis. This is a condition that slows down the movement of food in the stomach and small intestine. It is more common in people with diabetes  mellitus.  Gastroesophageal reflux disease (GERD). This is a digestive condition that makes stomach acid flow back into the esophagus.  Urinary retention. This means that the body is holding onto urine, and the bladder cannot be emptied all the way. Follow these instructions at home: Eating and drinking  Avoid eating too much.  Try not to swallow air while talking or eating.  Avoid eating while lying down.  Avoid these foods and drinks: ? Foods that cause gas, such as broccoli, cabbage, cauliflower, and baked beans. ? Carbonated drinks. ? Hard candy. ? Chewing gum. Medicines  Take over-the-counter and prescription medicines only as told by your health care provider.  Take probiotic medicines. These medicines contain live bacteria or yeasts that can help digestion.  Take coated peppermint oil capsules. Activity  Try to exercise regularly. Exercise may help to relieve bloating that is caused by gas and relieve constipation. General instructions  Keep all follow-up visits as told by your health care provider. This is important. Contact a health care provider if:  You have nausea and vomiting.  You have diarrhea.  You have abdominal pain.  You have unusual weight loss or weight gain.  You have severe pain, and medicines do not help. Get help right away if:  You have severe chest pain.  You have trouble breathing.  You have shortness of breath.  You have trouble urinating.  You have darker urine than normal.  You have blood in your stools or have dark, tarry stools. Summary  Abdominal bloating means that the abdomen is swollen.  Common  causes of abdominal bloating are swallowing air, constipation, and problems digesting food.  Avoid eating too much and avoid swallowing air.  Avoid foods that cause gas, carbonated drinks, hard candy, and chewing gum. This information is not intended to replace advice given to you by your health care provider. Make sure you  discuss any questions you have with your health care provider. Document Revised: 04/10/2018 Document Reviewed: 01/23/2016 Elsevier Patient Education  2020 [employer].

## 2019-07-04 NOTE — Assessment & Plan Note (Addendum)
Chronic history of intermittent diarrhea dating back to the 90s s/p cholecystectomy.  Typically always with soft-mushy postprandial BMs but occasionally will have days of 7-8 watery BMs.  Associated abdominal pain prior to BMs that resolves thereafter as well as gas/bloating.  No blood in the stool or melena.  She does note occasional toilet tissue hematochezia associated with rectal burning in the setting of frequent diarrhea.  Reports small hemorrhoids.  Uses witch hazel/barrier ointment which resolves rectal bleeding/burning. No nocturnal stools.  No regular dairy products.  Currently using Imodium as needed but desires better control of symptoms.  Reports previously being on medication prescribed by PCP which I suspect was Questran or Colestid but this caused constipation.  Reports last colonoscopy about 7 years ago at [facility] without polyps.  Most suspicious for bile salt diarrhea/IBS-D.  May also have dietary intolerances, celiac disease, thyroid abnormalities, SIBO.  Doubt infectious etiology.  Screen for celiac disease with TTG IgA and IgA total. Check TSH. Trial Bentyl 10 mg.  Patient will start with once daily but may increase up to 3 times daily before meals and at bedtime as needed for abdominal cramping/diarrhea.  Hold in setting of constipation. Advise she follow a low-fat diet. Avoid items that cause gas/bloating.  Handout provided. Request colonoscopy records. Follow-up after EGD for dysphagia as discussed above.

## 2019-07-05 LAB — IGA: Immunoglobulin A: 736 mg/dL — ABNORMAL HIGH (ref 47–310)

## 2019-07-05 LAB — TISSUE TRANSGLUTAMINASE, IGA: (tTG) Ab, IgA: 1 U/mL

## 2019-07-05 LAB — TSH: TSH: 2.11 mIU/L (ref 0.40–4.50)

## 2019-07-08 ENCOUNTER — Telehealth: Payer: Self-pay | Admitting: Gastroenterology

## 2019-07-08 ENCOUNTER — Encounter: Payer: Self-pay | Admitting: Gastroenterology

## 2019-07-08 NOTE — Telephone Encounter (Signed)
Colonoscopy 12/05/2012 with Dr. Teena Dunk.  Findings: Mild diverticulosis was found in sigmoid colon.  Retroflexed views in the rectum revealed no abnormalities.   Repeat exam: 10 years.  Patient will be due for colonoscopy in 2024.  This was for our records.

## 2019-07-10 ENCOUNTER — Telehealth: Payer: Self-pay | Admitting: Internal Medicine

## 2019-07-10 NOTE — Telephone Encounter (Signed)
Patient returned call, please call back  

## 2019-07-12 ENCOUNTER — Telehealth: Payer: Self-pay | Admitting: *Deleted

## 2019-07-12 NOTE — Telephone Encounter (Signed)
LMOVM to schedule EGD +/-dil with propofol with RMR

## 2019-07-12 NOTE — Telephone Encounter (Signed)
Patient returned call. She has been scheduled for 8/9 at 11:00am. Aware will need pre-op/covid. Will mail with instructions. Confirmed mailing address.

## 2019-07-13 ENCOUNTER — Encounter: Payer: Self-pay | Admitting: *Deleted

## 2019-07-13 DIAGNOSIS — Z6841 Body Mass Index (BMI) 40.0 and over, adult: Secondary | ICD-10-CM | POA: Diagnosis not present

## 2019-07-13 DIAGNOSIS — M2550 Pain in unspecified joint: Secondary | ICD-10-CM | POA: Diagnosis not present

## 2019-07-13 DIAGNOSIS — J439 Emphysema, unspecified: Secondary | ICD-10-CM | POA: Diagnosis not present

## 2019-07-13 DIAGNOSIS — Z79899 Other long term (current) drug therapy: Secondary | ICD-10-CM | POA: Diagnosis not present

## 2019-07-13 DIAGNOSIS — Z299 Encounter for prophylactic measures, unspecified: Secondary | ICD-10-CM | POA: Diagnosis not present

## 2019-07-13 DIAGNOSIS — Z87891 Personal history of nicotine dependence: Secondary | ICD-10-CM | POA: Diagnosis not present

## 2019-07-13 IMAGING — DX CHEST - 2 VIEW
2 series · 2 of 2 positions shown · non-contrast
Comparison: 06/16/2015

CLINICAL DATA: Shortness of breath.  History of COPD

EXAM:
CHEST - 2 VIEW

[chest pa]
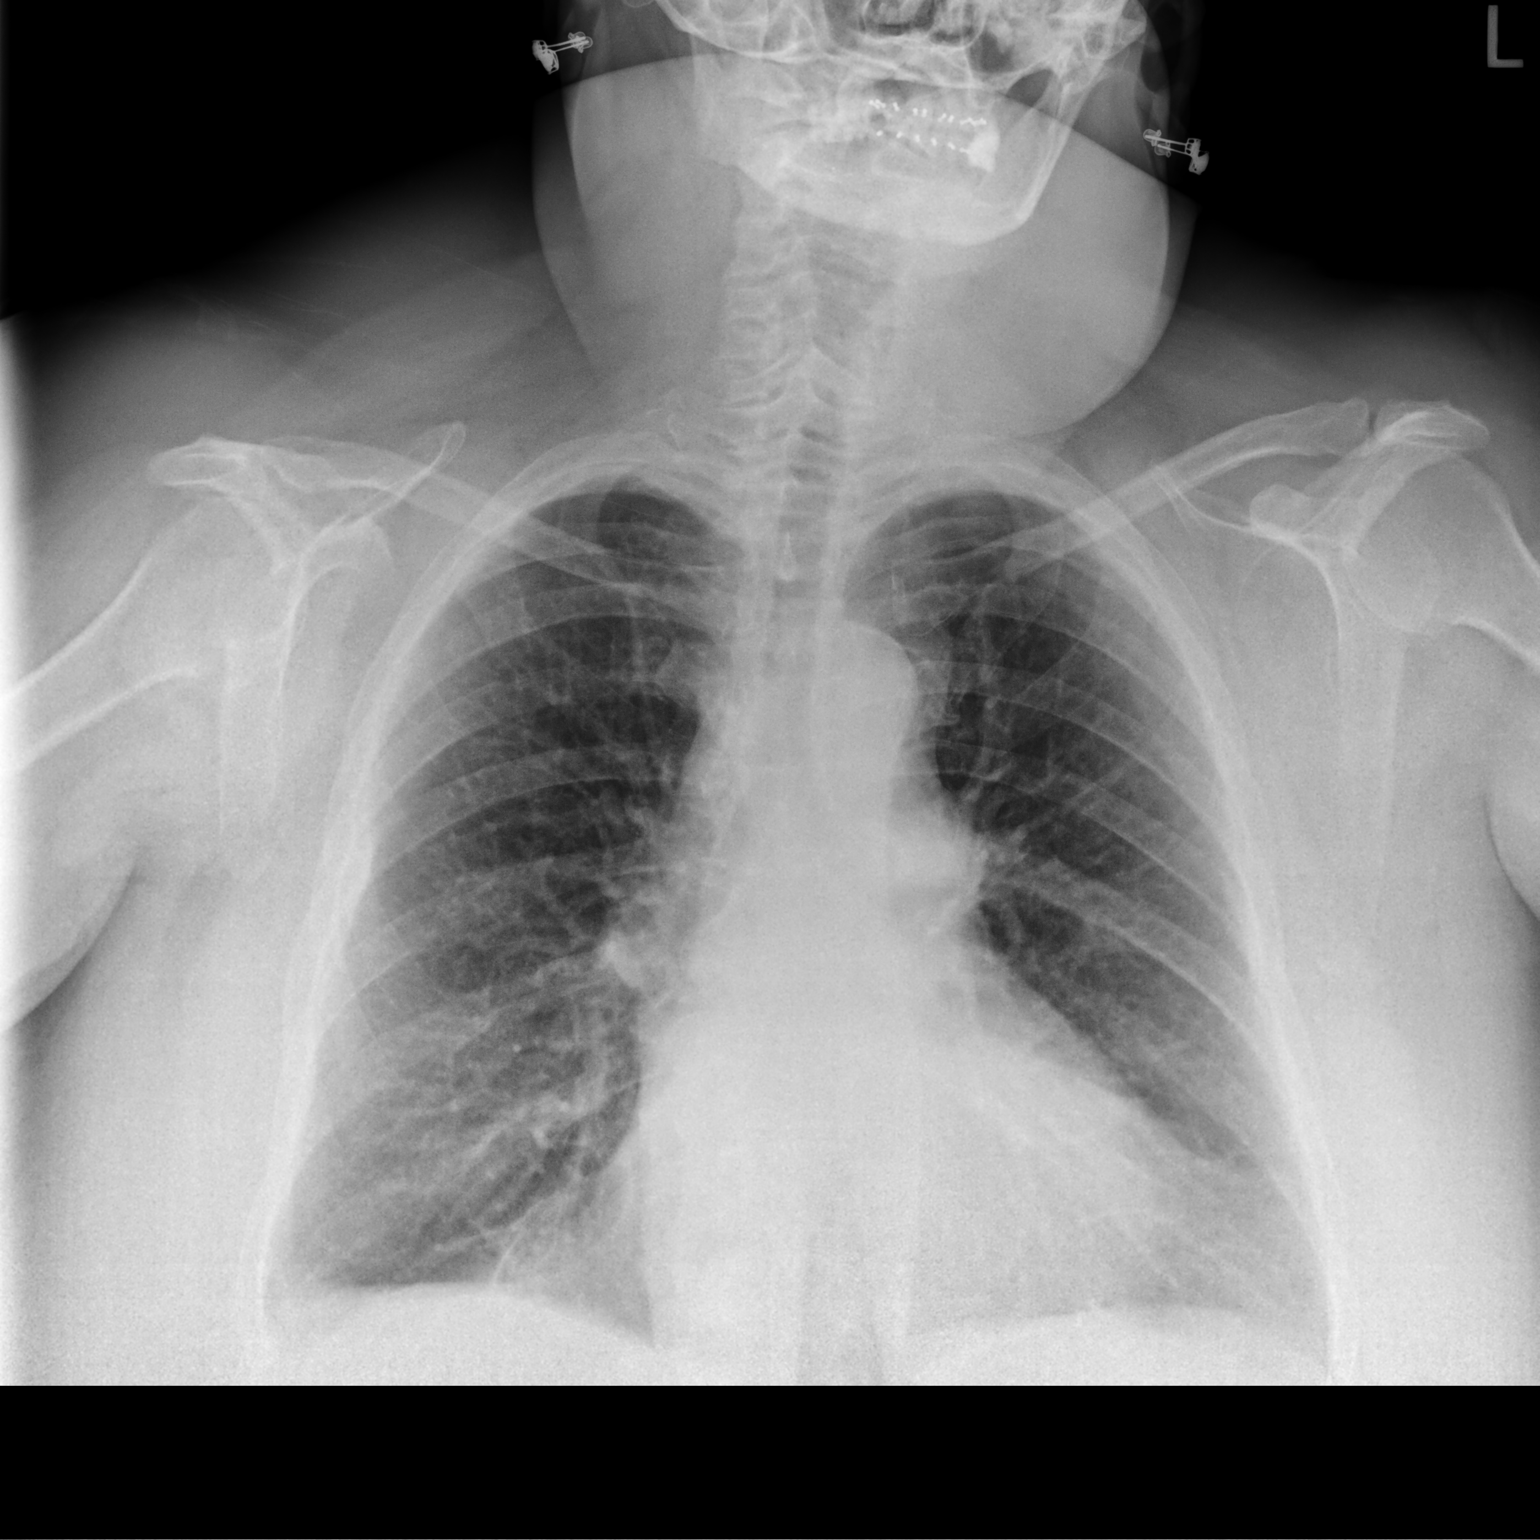

[chest lat]
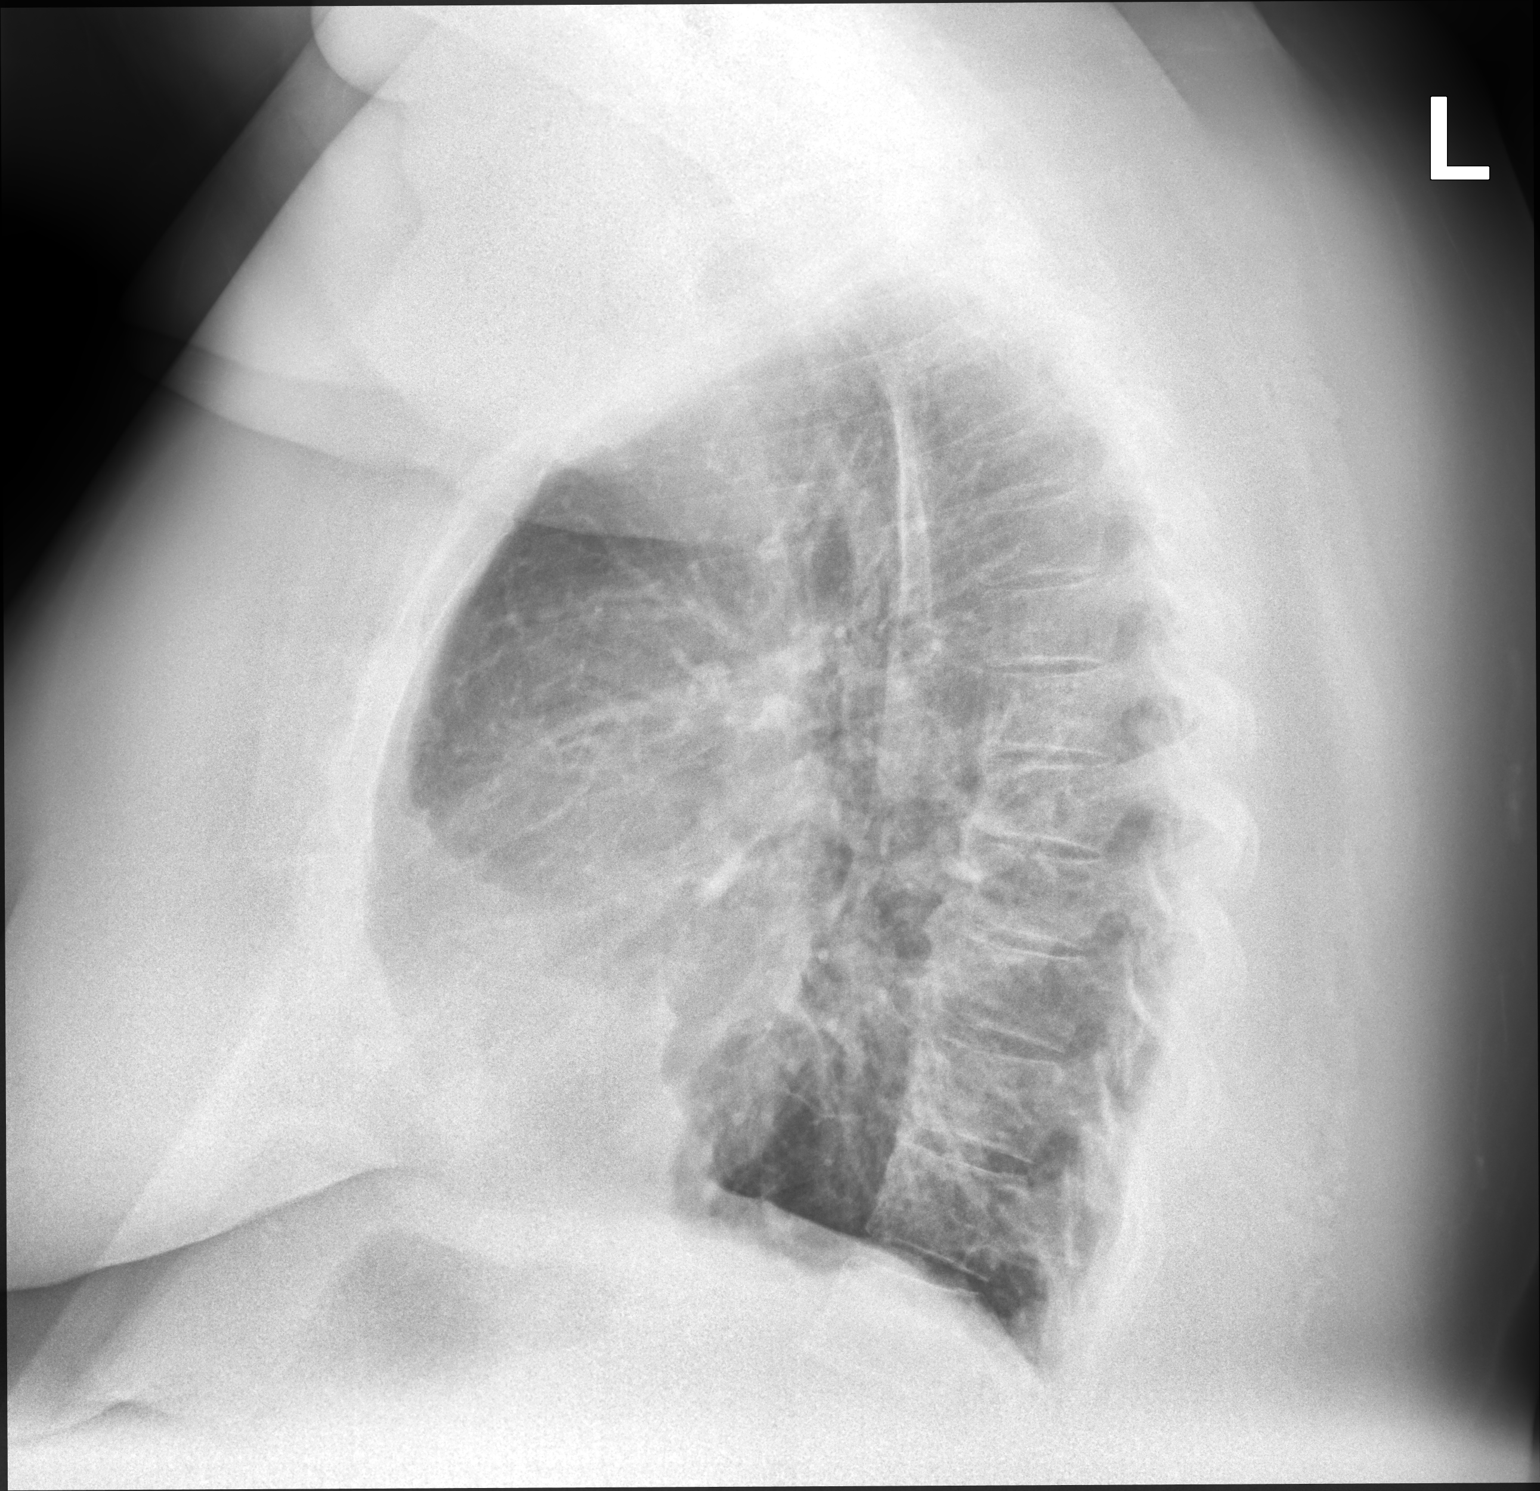

[2 of 2 positions shown; findings below may reference images not displayed]

FINDINGS: There is hyperinflation of the lungs compatible with COPD. Bibasilar
scarring. Heart is normal size. No acute confluent airspace
opacities or effusions.
IMPRESSION: COPD/chronic changes.  No active disease.

## 2019-08-03 DIAGNOSIS — R040 Epistaxis: Secondary | ICD-10-CM | POA: Diagnosis not present

## 2019-08-03 DIAGNOSIS — E7800 Pure hypercholesterolemia, unspecified: Secondary | ICD-10-CM | POA: Diagnosis not present

## 2019-08-07 NOTE — Patient Instructions (Signed)
Jenna Rivera  08/07/2019     @PREFPERIOPPHARMACY @   Your procedure is scheduled on  08/13/2019  Report to [facility] at  0930  A.M.  Call this number if you have problems the morning of surgery:  [phone]   Remember:  Follow the diet and prep instructions given to you by dr [phone] office.                     Take these medicines the morning of surgery with A SIP OF WATER  Gabapentin,hydrodcodone(if needed), ativan(if needed), zanaflex(if needed).    Do not wear jewelry, make-up or nail polish.  Do not wear lotions, powders, or perfumes. Please wear deodorant and brush your teeth.  Do not shave 48 hours prior to surgery.  Men may shave face and neck.  Do not bring valuables to the hospital.  [facility] is not responsible for any belongings or valuables.  Contacts, dentures or bridgework may not be worn into surgery.  Leave your suitcase in the car.  After surgery it may be brought to your room.  For patients admitted to the hospital, discharge time will be determined by your treatment team.  Patients discharged the day of surgery will not be allowed to drive home.   Name and phone number of your driver:   family Special instructions:  DO NOT smoke the morning of your procedure.  Please read over the following fact sheets that you were given. Anesthesia Post-op Instructions and Care and Recovery After Surgery       Upper Endoscopy, Adult, Care After This sheet gives you information about how to care for yourself after your procedure. Your health care provider may also give you more specific instructions. If you have problems or questions, contact your health care provider. What can I expect after the procedure? After the procedure, it is common to have:  A sore throat.  Mild stomach pain or discomfort.  Bloating.  Nausea. Follow these instructions at home:   Follow instructions from your health care provider about what to eat or drink after your  procedure.  Return to your normal activities as told by your health care provider. Ask your health care provider what activities are safe for you.  Take over-the-counter and prescription medicines only as told by your health care provider.  Do not drive for 24 hours if you were given a sedative during your procedure.  Keep all follow-up visits as told by your health care provider. This is important. Contact a health care provider if you have:  A sore throat that lasts longer than one day.  Trouble swallowing. Get help right away if:  You vomit blood or your vomit looks like coffee grounds.  You have: ? A fever. ? Bloody, black, or tarry stools. ? A severe sore throat or you cannot swallow. ? Difficulty breathing. ? Severe pain in your chest or abdomen. Summary  After the procedure, it is common to have a sore throat, mild stomach discomfort, bloating, and nausea.  Do not drive for 24 hours if you were given a sedative during the procedure.  Follow instructions from your health care provider about what to eat or drink after your procedure.  Return to your normal activities as told by your health care provider. This information is not intended to replace advice given to you by your health care provider. Make sure you discuss any questions you have with your health  care provider. Document Revised: 06/14/2017 Document Reviewed: 05/23/2017 Elsevier Patient Education  [place].  Esophageal Dilatation Esophageal dilatation, also called esophageal dilation, is a procedure to widen or open (dilate) a blocked or narrowed part of the esophagus. The esophagus is the part of the body that moves food and liquid from the mouth to the stomach. You may need this procedure if:  You have a buildup of scar tissue in your esophagus that makes it difficult, painful, or impossible to swallow. This can be caused by gastroesophageal reflux disease (GERD).  You have cancer of the  esophagus.  There is a problem with how food moves through your esophagus. In some cases, you may need this procedure repeated at a later time to dilate the esophagus gradually. Tell a health care provider about:  Any allergies you have.  All medicines you are taking, including vitamins, herbs, eye drops, creams, and over-the-counter medicines.  Any problems you or family members have had with anesthetic medicines.  Any blood disorders you have.  Any surgeries you have had.  Any medical conditions you have.  Any antibiotic medicines you are required to take before dental procedures.  Whether you are pregnant or may be pregnant. What are the risks? Generally, this is a safe procedure. However, problems may occur, including:  Bleeding due to a tear in the lining of the esophagus.  A hole (perforation) in the esophagus. What happens before the procedure?  Follow instructions from your health care provider about eating or drinking restrictions.  Ask your health care provider about changing or stopping your regular medicines. This is especially important if you are taking diabetes medicines or blood thinners.  Plan to have someone take you home from the hospital or clinic.  Plan to have a responsible adult care for you for at least 24 hours after you leave the hospital or clinic. This is important. What happens during the procedure?  You may be given a medicine to help you relax (sedative).  A numbing medicine may be sprayed into the back of your throat, or you may gargle the medicine.  Your health care provider may perform the dilatation using various surgical instruments, such as: ? Simple dilators. This instrument is carefully placed in the esophagus to stretch it. ? Guided wire bougies. This involves using an endoscope to insert a wire into the esophagus. A dilator is passed over this wire to enlarge the esophagus. Then the wire is removed. ? Balloon dilators. An endoscope  with a small balloon at the end is inserted into the esophagus. The balloon is inflated to stretch the esophagus and open it up. The procedure may vary among health care providers and hospitals. What happens after the procedure?  Your blood pressure, heart rate, breathing rate, and blood oxygen level will be monitored until the medicines you were given have worn off.  Your throat may feel slightly sore and numb. This will improve slowly over time.  You will not be allowed to eat or drink until your throat is no longer numb.  When you are able to drink, urinate, and sit on the edge of the bed without nausea or dizziness, you may be able to return home. Follow these instructions at home:  Take over-the-counter and prescription medicines only as told by your health care provider.  Do not drive for 24 hours if you were given a sedative during your procedure.  You should have a responsible adult with you for 24 hours after the procedure.  Follow instructions from your health care provider about any eating or drinking restrictions.  Do not use any products that contain nicotine or tobacco, such as cigarettes and e-cigarettes. If you need help quitting, ask your health care provider.  Keep all follow-up visits as told by your health care provider. This is important. Get help right away if you:  Have a fever.  Have chest pain.  Have pain that is not relieved by medication.  Have trouble breathing.  Have trouble swallowing.  Vomit blood. Summary  Esophageal dilatation, also called esophageal dilation, is a procedure to widen or open (dilate) a blocked or narrowed part of the esophagus.  Plan to have someone take you home from the hospital or clinic.  For this procedure, a numbing medicine may be sprayed into the back of your throat, or you may gargle the medicine.  Do not drive for 24 hours if you were given a sedative during your procedure. This information is not intended to  replace advice given to you by your health care provider. Make sure you discuss any questions you have with your health care provider. Document Revised: 10/18/2018 Document Reviewed: 10/26/2016 Elsevier Patient Education  2020 [place] After These instructions provide you with information about caring for yourself after your procedure. Your health care provider may also give you more specific instructions. Your treatment has been planned according to current medical practices, but problems sometimes occur. Call your health care provider if you have any problems or questions after your procedure. What can I expect after the procedure? After your procedure, you may:  Feel sleepy for several hours.  Feel clumsy and have poor balance for several hours.  Feel forgetful about what happened after the procedure.  Have poor judgment for several hours.  Feel nauseous or vomit.  Have a sore throat if you had a breathing tube during the procedure. Follow these instructions at home: For at least 24 hours after the procedure:      Have a responsible adult stay with you. It is important to have someone help care for you until you are awake and alert.  Rest as needed.  Do not: ? Participate in activities in which you could fall or become injured. ? Drive. ? Use heavy machinery. ? Drink alcohol. ? Take sleeping pills or medicines that cause drowsiness. ? Make important decisions or sign legal documents. ? Take care of children on your own. Eating and drinking  Follow the diet that is recommended by your health care provider.  If you vomit, drink water, juice, or soup when you can drink without vomiting.  Make sure you have little or no nausea before eating solid foods. General instructions  Take over-the-counter and prescription medicines only as told by your health care provider.  If you have sleep apnea, surgery and certain medicines can increase  your risk for breathing problems. Follow instructions from your health care provider about wearing your sleep device: ? Anytime you are sleeping, including during daytime naps. ? While taking prescription pain medicines, sleeping medicines, or medicines that make you drowsy.  If you smoke, do not smoke without supervision.  Keep all follow-up visits as told by your health care provider. This is important. Contact a health care provider if:  You keep feeling nauseous or you keep vomiting.  You feel light-headed.  You develop a rash.  You have a fever. Get help right away if:  You have trouble breathing. Summary  For several  hours after your procedure, you may feel sleepy and have poor judgment.  Have a responsible adult stay with you for at least 24 hours or until you are awake and alert. This information is not intended to replace advice given to you by your health care provider. Make sure you discuss any questions you have with your health care provider. Document Revised: 03/21/2017 Document Reviewed: 04/13/2015 Elsevier Patient Education  [place].

## 2019-08-09 ENCOUNTER — Encounter (HOSPITAL_COMMUNITY): Payer: Self-pay

## 2019-08-09 ENCOUNTER — Other Ambulatory Visit (HOSPITAL_COMMUNITY)
Admission: RE | Admit: 2019-08-09 | Discharge: 2019-08-09 | Disposition: A | Discharge status: Home / Self Care | Payer: PPO | Source: Ambulatory Visit | Attending: Internal Medicine | Admitting: Internal Medicine

## 2019-08-09 ENCOUNTER — Encounter (HOSPITAL_COMMUNITY)
Admission: RE | Admit: 2019-08-09 | Discharge: 2019-08-09 | Disposition: A | Discharge status: Home / Self Care | Payer: PPO | Source: Ambulatory Visit | Attending: Internal Medicine | Admitting: Internal Medicine

## 2019-08-09 ENCOUNTER — Other Ambulatory Visit: Payer: Self-pay

## 2019-08-09 DIAGNOSIS — Z01818 Encounter for other preprocedural examination: Secondary | ICD-10-CM | POA: Insufficient documentation

## 2019-08-09 DIAGNOSIS — Z20822 Contact with and (suspected) exposure to covid-19: Secondary | ICD-10-CM | POA: Diagnosis not present

## 2019-08-09 LAB — SARS CORONAVIRUS 2 (TAT 6-24 HRS): SARS Coronavirus 2: NEGATIVE

## 2019-08-09 LAB — BASIC METABOLIC PANEL
Anion gap: 7 (ref 5–15)
BUN: 19 mg/dL (ref 6–20)
CO2: 28 mmol/L (ref 22–32)
Calcium: 8.9 mg/dL (ref 8.9–10.3)
Chloride: 105 mmol/L (ref 98–111)
Creatinine, Ser: 0.84 mg/dL (ref 0.44–1.00)
GFR calc Af Amer: >60 mL/min (ref >60)
GFR calc non Af Amer: >60 mL/min (ref >60)
Glucose, Bld: 128 mg/dL — ABNORMAL HIGH (ref 70–99)
Potassium: 3.8 mmol/L (ref 3.5–5.1)
Sodium: 140 mmol/L (ref 135–145)

## 2019-08-09 NOTE — Anesthesia Preprocedure Evaluation (Addendum)
Anesthesia Evaluation  Patient identified by MRN, date of birth, ID band Patient awake    Reviewed: Allergy & Precautions, H&P , NPO status , Patient's Chart, lab work & pertinent test results, reviewed documented beta blocker date and time   Airway Mallampati: II  TM Distance: >3 FB Neck ROM: full    Dental no notable dental hx. (+) Edentulous Upper, Edentulous Lower   Pulmonary asthma , sleep apnea and Continuous Positive Airway Pressure Ventilation , COPD, former smoker,    Pulmonary exam normal breath sounds clear to auscultation       Cardiovascular Exercise Tolerance: Good hypertension, negative cardio ROS   Rhythm:regular Rate:Normal     Neuro/Psych PSYCHIATRIC DISORDERS Anxiety negative neurological ROS     GI/Hepatic negative GI ROS, Neg liver ROS,   Endo/Other  Morbid obesity  Renal/GU negative Renal ROS  negative genitourinary   Musculoskeletal   Abdominal   Peds  Hematology negative hematology ROS (+)   Anesthesia Other Findings   Reproductive/Obstetrics negative OB ROS                            Anesthesia Physical Anesthesia Plan  ASA: III  Anesthesia Plan: General   Post-op Pain Management:    Induction:   PONV Risk Score and Plan: Propofol infusion  Airway Management Planned:   Additional Equipment:   Intra-op Plan:   Post-operative Plan:   Informed Consent: I have reviewed the patients History and Physical, chart, labs and discussed the procedure including the risks, benefits and alternatives for the proposed anesthesia with the patient or authorized representative who has indicated his/her understanding and acceptance.     Dental Advisory Given  Plan Discussed with: CRNA  Anesthesia Plan Comments:         Anesthesia Quick Evaluation

## 2019-08-13 ENCOUNTER — Ambulatory Visit (HOSPITAL_COMMUNITY): Payer: PPO | Admitting: Anesthesiology

## 2019-08-13 ENCOUNTER — Encounter (HOSPITAL_COMMUNITY)
Admission: RE | Disposition: A | Discharge status: Home / Self Care | Payer: Self-pay | Source: Home / Self Care | Attending: Internal Medicine

## 2019-08-13 ENCOUNTER — Other Ambulatory Visit: Payer: Self-pay

## 2019-08-13 ENCOUNTER — Encounter (HOSPITAL_COMMUNITY): Payer: Self-pay | Admitting: Internal Medicine

## 2019-08-13 ENCOUNTER — Ambulatory Visit (HOSPITAL_COMMUNITY)
Admission: RE | Admit: 2019-08-13 | Discharge: 2019-08-13 | Disposition: A | Discharge status: Home / Self Care | Payer: PPO | Attending: Internal Medicine | Admitting: Internal Medicine

## 2019-08-13 DIAGNOSIS — Z6841 Body Mass Index (BMI) 40.0 and over, adult: Secondary | ICD-10-CM | POA: Insufficient documentation

## 2019-08-13 DIAGNOSIS — G4730 Sleep apnea, unspecified: Secondary | ICD-10-CM | POA: Diagnosis not present

## 2019-08-13 DIAGNOSIS — Z87891 Personal history of nicotine dependence: Secondary | ICD-10-CM | POA: Diagnosis not present

## 2019-08-13 DIAGNOSIS — R1310 Dysphagia, unspecified: Secondary | ICD-10-CM

## 2019-08-13 DIAGNOSIS — J449 Chronic obstructive pulmonary disease, unspecified: Secondary | ICD-10-CM | POA: Insufficient documentation

## 2019-08-13 DIAGNOSIS — K259 Gastric ulcer, unspecified as acute or chronic, without hemorrhage or perforation: Secondary | ICD-10-CM | POA: Diagnosis not present

## 2019-08-13 DIAGNOSIS — F419 Anxiety disorder, unspecified: Secondary | ICD-10-CM | POA: Insufficient documentation

## 2019-08-13 DIAGNOSIS — Z79899 Other long term (current) drug therapy: Secondary | ICD-10-CM | POA: Insufficient documentation

## 2019-08-13 DIAGNOSIS — K219 Gastro-esophageal reflux disease without esophagitis: Secondary | ICD-10-CM | POA: Insufficient documentation

## 2019-08-13 DIAGNOSIS — Z7951 Long term (current) use of inhaled steroids: Secondary | ICD-10-CM | POA: Insufficient documentation

## 2019-08-13 DIAGNOSIS — I10 Essential (primary) hypertension: Secondary | ICD-10-CM | POA: Diagnosis not present

## 2019-08-13 DIAGNOSIS — J45909 Unspecified asthma, uncomplicated: Secondary | ICD-10-CM | POA: Diagnosis not present

## 2019-08-13 DIAGNOSIS — R1314 Dysphagia, pharyngoesophageal phase: Secondary | ICD-10-CM | POA: Diagnosis not present

## 2019-08-13 DIAGNOSIS — K269 Duodenal ulcer, unspecified as acute or chronic, without hemorrhage or perforation: Secondary | ICD-10-CM | POA: Diagnosis not present

## 2019-08-13 HISTORY — PX: ESOPHAGOGASTRODUODENOSCOPY (EGD) WITH PROPOFOL: SHX5813

## 2019-08-13 HISTORY — PX: MALONEY DILATION: SHX5535

## 2019-08-13 SURGERY — ESOPHAGOGASTRODUODENOSCOPY (EGD) WITH PROPOFOL
Anesthesia: General

## 2019-08-13 MED ORDER — CHLORHEXIDINE GLUCONATE CLOTH 2 % EX PADS: 6.0000 | MEDICATED_PAD | Freq: Once | CUTANEOUS | Status: DC

## 2019-08-13 MED ORDER — GLYCOPYRROLATE 0.2 MG/ML IJ SOLN
0.2000 mg | Freq: Once | INTRAMUSCULAR | Status: AC
  Administered 2019-08-13: 0.2 mg via INTRAVENOUS

## 2019-08-13 MED ORDER — LIDOCAINE VISCOUS HCL 2 % MT SOLN
15.0000 mL | Freq: Once | OROMUCOSAL | Status: AC
  Administered 2019-08-13: 15 mL via OROMUCOSAL

## 2019-08-13 MED ORDER — STERILE WATER FOR IRRIGATION IR SOLN
Status: DC | PRN
  Administered 2019-08-13: 1.5 mL

## 2019-08-13 MED ORDER — LIDOCAINE VISCOUS HCL 2 % MT SOLN
OROMUCOSAL | Status: AC
  Filled 2019-08-13: qty 15

## 2019-08-13 MED ORDER — GLYCOPYRROLATE 0.2 MG/ML IJ SOLN
INTRAMUSCULAR | Status: AC
  Filled 2019-08-13: qty 1

## 2019-08-13 MED ORDER — DEXAMETHASONE SODIUM PHOSPHATE 4 MG/ML IJ SOLN
INTRAMUSCULAR | Status: AC
  Filled 2019-08-13: qty 3

## 2019-08-13 MED ORDER — PROPOFOL 10 MG/ML IV BOLUS
INTRAVENOUS | Status: DC | PRN
  Administered 2019-08-13: 20 mg via INTRAVENOUS
  Administered 2019-08-13: 40 mg via INTRAVENOUS
  Administered 2019-08-13: 80 mg via INTRAVENOUS

## 2019-08-13 MED ORDER — LACTATED RINGERS IV SOLN: INTRAVENOUS | Status: DC

## 2019-08-13 NOTE — Transfer of Care (Signed)
Immediate Anesthesia Transfer of Care Note  Patient: Jenna Rivera  Procedure(s) Performed: ESOPHAGOGASTRODUODENOSCOPY (EGD) WITH PROPOFOL (N/A ) MALONEY DILATION (N/A )  Patient Location: PACU  Anesthesia Type:General  Level of Consciousness: awake, alert  and patient cooperative  Airway & Oxygen Therapy: Patient Spontanous Breathing  Post-op Assessment: Report given to RN and Post -op Vital signs reviewed and stable  Post vital signs: Reviewed and stable  Last Vitals:  Vitals Value Taken Time  BP 148/60 08/13/19 1307  Temp 97.9   Pulse 56 08/13/19 1309  Resp 16 08/13/19 1309  SpO2 97 % 08/13/19 1309  Vitals shown include unvalidated device data.  Last Pain:  Vitals:   08/13/19 1250  TempSrc:   PainSc: 0-No pain         Complications: No complications documented.

## 2019-08-13 NOTE — H&P (Signed)
@LOGO @   Primary Care Physician:  , MD Primary Gastroenterologist:  Dr. Kirstie Peri  Pre-Procedure History & Physical: HPI:  Jenna Rivera is a 59 y.o. female here for further evaluation of esophageal dysphagia.  Has chronic diarrhea as a separate issue celiac screen negative.  GERD well controlled since she started using CPAP  Past Medical History:  Diagnosis Date  . Acute bronchitis with COPD (HCC)   . Allergic rhinitis   . Anxiety   . Asthma   . Cervical disc herniation   . Chest pain   . Chronic pain   . COPD (chronic obstructive pulmonary disease) (HCC)   . Edema   . HTN (hypertension)   . Hyperlipidemia   . Lumbago   . Neck pain   . Osteoporosis   . Reflux esophagitis   . Rosacea   . Sleep apnea    uses CPAP    Past Surgical History:  Procedure Laterality Date  . ABDOMINAL HYSTERECTOMY    . CHOLECYSTECTOMY     1990s  . COLONOSCOPY  12/05/2012   [place]; Dr. 14/02/2012; sigmoid diverticulosis, otherwise normal exam.  Recommended repeat in 10 years.  Teena Dunk MOUTH SURGERY    . TUBAL LIGATION      Prior to Admission medications   Medication Sig Start Date End Date Taking? Authorizing Provider  acetaminophen (TYLENOL) 500 MG tablet Take 500-1,000 mg by mouth every 6 (six) hours as needed (for pain.).   Yes [provider]  albuterol (PROAIR HFA) 108 (90 Base) MCG/ACT inhaler Inhale 2 puffs into the lungs every 6 (six) hours as needed for wheezing or shortness of breath.   Yes [provider]  Ascorbic Acid (VITAMIN C) 1000 MG tablet Take 1,000 mg by mouth daily.   Yes [provider]  budesonide-formoterol (SYMBICORT) 160-4.5 MCG/ACT inhaler Inhale 2 puffs into the lungs 2 (two) times daily.   Yes [provider]  calcium carbonate (OSCAL) 1500 (600 Ca) MG TABS tablet Take 600 mg by mouth daily.   Yes [provider]  cholecalciferol (VITAMIN D) 25 MCG (1000 UNIT) tablet Take 1,000 Units by mouth daily.   Yes [provider]  dicyclomine (BENTYL) 10 MG capsule Take 1 capsule (10 mg total) by mouth 4 (four) times daily -  before meals and at bedtime. 07/04/19  Yes 07/06/19, Kristen S, PA-C  fluticasone (FLONASE) 50 MCG/ACT nasal spray Place 2 sprays into both nostrils daily at 12 noon.    Yes [provider]  gabapentin (NEURONTIN) 300 MG capsule Take 300 mg by mouth 2 (two) times daily.   Yes [provider]  HYDROcodone-acetaminophen (NORCO) 7.5-325 MG tablet Take 1 tablet by mouth in the morning, at noon, in the evening, and at bedtime.    Yes [provider]  ibuprofen (ADVIL) 200 MG tablet Take 200-400 mg by mouth every 8 (eight) hours as needed (for pain.).   Yes [provider]  ipratropium-albuterol (DUONEB) 0.5-2.5 (3) MG/3ML SOLN Take 3 mLs by nebulization every 4 (four) hours as needed (wheezing/shortness of breath.).    Yes [provider]  latanoprost (XALATAN) 0.005 % ophthalmic solution Place 1 drop into both eyes at bedtime.  06/21/19  Yes [provider]  lisinopril (ZESTRIL) 5 MG tablet Take 5 mg by mouth daily.   Yes [provider]  LORazepam (ATIVAN) 1 MG tablet Take 1 mg by mouth 2 (two) times daily as needed for anxiety.  12/13/12  Yes [provider]  montelukast (  SINGULAIR) 10 MG tablet Take 10 mg by mouth at bedtime.   Yes [provider]  Multiple Vitamin (MULTIVITAMIN WITH MINERALS) TABS tablet Take 1 tablet by mouth daily.   Yes [provider]  tiZANidine (ZANAFLEX) 4 MG tablet Take 4 mg by mouth every 6 (six) hours as needed for muscle spasms.   Yes [provider]  albuterol (PROVENTIL) (2.5 MG/3ML) 0.083% nebulizer solution Inhale 3 mLs into the lungs every 6 (six) hours as needed (wheezing/shortness of breath.).  11/19/12   [provider]  furosemide (LASIX) 40 MG tablet Take 20-40 mg by mouth daily as needed (fluid retention.).     [provider]  potassium  chloride (K-DUR) 10 MEQ tablet Take 10-20 mEq by mouth daily as needed (fluid retention.).     [provider]    Allergies as of 07/12/2019  . (No Known Allergies)    Family History  Problem Relation Age of Onset  . COPD Mother   . Cancer Father   . Breast cancer Sister   . Lung cancer Sister   . Multiple sclerosis Sister   . Colon cancer Neg Hx        family history is limited.     Social History   Socioeconomic History  . Marital status: Married    Spouse name: Not on file  . Number of children: Not on file  . Years of education: Not on file  . Highest education level: Not on file  Occupational History  . Not on file  Tobacco Use  . Smoking status: Former Smoker    Packs/day: 1.00    Years: 15.00    Pack years: 15.00    Types: Cigarettes    Quit date: 01/05/1991    Years since quitting: 28.6  . Smokeless tobacco: Never Used  Vaping Use  . Vaping Use: Never used  Substance and Sexual Activity  . Alcohol use: Yes    Comment: occasional  . Drug use: Never  . Sexual activity: Not on file  Other Topics Concern  . Not on file  Social History Narrative  . Not on file   Social Determinants of Health   Financial Resource Strain:   . Difficulty of Paying Living Expenses:   Food Insecurity:   . Worried About Programme researcher, broadcasting/film/video in the Last Year:   . Barista in the Last Year:   Transportation Needs:   . Freight forwarder (Medical):   Marland Kitchen Lack of Transportation (Non-Medical):   Physical Activity:   . Days of Exercise per Week:   . Minutes of Exercise per Session:   Stress:   . Feeling of Stress :   Social Connections:   . Frequency of Communication with Friends and Family:   . Frequency of Social Gatherings with Friends and Family:   . Attends Religious Services:   . Active Member of Clubs or Organizations:   . Attends Banker Meetings:   Marland Kitchen Marital Status:   Intimate Partner Violence:   . Fear of Current or Ex-Partner:   .  Emotionally Abused:   Marland Kitchen Physically Abused:   . Sexually Abused:     Review of Systems: See HPI, otherwise negative ROS  Physical Exam: BP (!) 145/53   Pulse (!) 53   Temp 98.3 F (36.8 C) (Oral)   Resp 13   Ht 5\' 1"  (1.549 m)   Wt 127 kg   SpO2 95%   BMI 52.90 kg/m  General:   Alert,  Well-developed, well-nourished, pleasant and cooperative in NAD Neck:  Supple; no masses or thyromegaly. No significant cervical adenopathy. Lungs:  Clear throughout to auscultation.   No wheezes, crackles, or rhonchi. No acute distress. Heart:  Regular rate and rhythm; no murmurs, clicks, rubs,  or gallops. Abdomen: Non-distended, normal bowel sounds.  Soft and nontender without appreciable mass or hepatosplenomegaly.  Pulses:  Normal pulses noted. Extremities:  Without clubbing or edema.  Impression/Plan: 59 year old obese lady lady esophageal dysphagia.  Denies significant reflux symptoms.  I have offered the patient a diagnostic EGD with possible esophageal dilation as feasible/appropriate per plan.  The risks, benefits, limitations, alternatives and imponderables have been reviewed with the patient. Potential for esophageal dilation, biopsy, etc. have also been reviewed.  Questions have been answered. All parties agreeable.     Notice: This dictation was prepared with Dragon dictation along with smaller phrase technology. Any transcriptional errors that result from this process are unintentional and may not be corrected upon review.

## 2019-08-13 NOTE — Anesthesia Postprocedure Evaluation (Signed)
Anesthesia Post Note  Patient: Jenna Rivera  Procedure(s) Performed: ESOPHAGOGASTRODUODENOSCOPY (EGD) WITH PROPOFOL (N/A ) MALONEY DILATION (N/A )  Patient location during evaluation: PACU Anesthesia Type: General Level of consciousness: awake and alert and patient cooperative Pain management: satisfactory to patient Vital Signs Assessment: post-procedure vital signs reviewed and stable Respiratory status: spontaneous breathing Cardiovascular status: stable Postop Assessment: no apparent nausea or vomiting Anesthetic complications: no   No complications documented.   Last Vitals:  Vitals:   08/13/19 1306 08/13/19 1315  BP: (!) 148/60   Pulse: (!) 56 (!) 55  Resp: 14 16  Temp: (P) 36.5 C   SpO2: 99% 97%    Last Pain:  Vitals:   08/13/19 1250  TempSrc:   PainSc: 0-No pain                 Nusaiba Guallpa

## 2019-08-13 NOTE — Op Note (Signed)
[facility] Patient Name: Jenna Rivera Procedure Date: 08/13/2019 11:55 AM MRN: [id number] Date of Birth: Dec 02, 1960 Attending MD: Gennette Pac , MD CSN: [id number] Age: 59 Admit Type: Outpatient Procedure:                Upper GI endoscopy Indications:              Dysphagia Providers:                Gennette Pac, MD, Buel Ream. Thomasena Edis RN, RN,                            Crystal Page, Edythe Clarity, Technician Referring MD:              Medicines:                Propofol per Anesthesia Complications:            No immediate complications. Estimated Blood Loss:     Estimated blood loss was minimal. Procedure:                Pre-Anesthesia Assessment:                           - Prior to the procedure, a History and Physical                            was performed, and patient medications and                            allergies were reviewed. The patient's tolerance of                            previous anesthesia was also reviewed. The risks                            and benefits of the procedure and the sedation                            options and risks were discussed with the patient.                            All questions were answered, and informed consent                            was obtained. Prior Anticoagulants: The patient has                            taken no previous anticoagulant or antiplatelet                            agents. ASA Grade Assessment: II - A patient with                            mild systemic disease. After reviewing the risks  and benefits, the patient was deemed in                            satisfactory condition to undergo the procedure.                           After obtaining informed consent, the endoscope was                            passed under direct vision. Throughout the                            procedure, the patient's blood pressure, pulse, and                            oxygen  saturations were monitored continuously. The                            GIF-H190 ([id number]) scope was introduced through the                            mouth, and advanced to the second part of duodenum.                            The upper GI endoscopy was accomplished without                            difficulty. The patient tolerated the procedure                            well. Scope In: 12:53:33 PM Scope Out: 1:00:24 PM Total Procedure Duration: 0 hours 6 minutes 51 seconds  Findings:      The examined esophagus was normal. Numerous gastric erosions in the       antrum and body. No ulcer or infiltrating process. Pylorus patent.       Bulbar erosions present; otherwise D1 and D2 appeared normal.      Scope withdrawn. A 56 French Maloney dilator was passed to full       insertion easily without resistance. Look back revealed no apparent       complication related to this maneuver. There was no bleeding. Finally,       biopsies of the gastric mucosa were taken for histologic study. Impression:               - Normal esophagus.?"Post Maloney dilation.                           -Gastric erosions?"status post biopsy Moderate Sedation:      Moderate (conscious) sedation was personally administered by an       anesthesia professional. The following parameters were monitored: oxygen       saturation, heart rate, blood pressure, respiratory rate, EKG, adequacy       of pulmonary ventilation, and response to care. Recommendation:           - Patient has a contact number available for  emergencies. The signs and symptoms of potential                            delayed complications were discussed with the                            patient. Return to normal activities tomorrow.                            Written discharge instructions were provided to the                            patient.                           - Advance diet as tolerated. Begin Protonix 40 mg                             once daily. Office visit with [place] in 3 months.                            Follow-up on pathology. Procedure Code(s):        --- Professional ---                           [phone], Esophagogastroduodenoscopy, flexible,                            transoral; diagnostic, including collection of                            specimen(s) by brushing or washing, when performed                            (separate procedure) Diagnosis Code(s):        --- Professional ---                           R13.10, Dysphagia, unspecified CPT copyright 2019 [employer]. All rights reserved. The codes documented in this report are preliminary and upon coder review may  be revised to meet current compliance requirements. Gerrit Friends. Alitza Cowman, MD Gennette Pac, MD 08/13/2019 1:08:38 PM This report has been signed electronically. Number of Addenda: 0

## 2019-08-13 NOTE — Discharge Instructions (Signed)
EGD Discharge instructions Please read the instructions outlined below and refer to this sheet in the next few weeks. These discharge instructions provide you with general information on caring for yourself after you leave the hospital. Your doctor may also give you specific instructions. While your treatment has been planned according to the most current medical practices available, unavoidable complications occasionally occur. If you have any problems or questions after discharge, please call your doctor. ACTIVITY  You may resume your regular activity but move at a slower pace for the next 24 hours.   Take frequent rest periods for the next 24 hours.   Walking will help expel (get rid of) the air and reduce the bloated feeling in your abdomen.   No driving for 24 hours (because of the anesthesia (medicine) used during the test).   You may shower.   Do not sign any important legal documents or operate any machinery for 24 hours (because of the anesthesia used during the test).  NUTRITION  Drink plenty of fluids.   You may resume your normal diet.   Begin with a light meal and progress to your normal diet.   Avoid alcoholic beverages for 24 hours or as instructed by your caregiver.  MEDICATIONS  You may resume your normal medications unless your caregiver tells you otherwise.  WHAT YOU CAN EXPECT TODAY  You may experience abdominal discomfort such as a feeling of fullness or "gas" pains.  FOLLOW-UP  Your doctor will discuss the results of your test with you.  SEEK IMMEDIATE MEDICAL ATTENTION IF ANY OF THE FOLLOWING OCCUR:  Excessive nausea (feeling sick to your stomach) and/or vomiting.   Severe abdominal pain and distention (swelling).   Trouble swallowing.   Temperature over 101 F (37.8 C).   Rectal bleeding or vomiting of blood.    I stretched your esophagus today.  Your stomach was inflamed.  Biopsies were taken.  Begin Protonix 40 mg once daily-prescription  provided  Office visit with [place] in 3 months  Further recommendations to follow pending review of pathology report  At patient request, I called Freedom Lopezperez at [phone] -rolled to voicemail

## 2019-08-13 NOTE — Anesthesia Procedure Notes (Signed)
Date/Time: 08/13/2019 12:46 PM Performed by: Franco Nones, CRNA Pre-anesthesia Checklist: Patient identified, Emergency Drugs available, Suction available, Timeout performed and Patient being monitored Patient Re-evaluated:Patient Re-evaluated prior to induction Oxygen Delivery Method: Non-rebreather mask

## 2019-08-14 ENCOUNTER — Other Ambulatory Visit: Payer: Self-pay

## 2019-08-14 DIAGNOSIS — M2550 Pain in unspecified joint: Secondary | ICD-10-CM | POA: Diagnosis not present

## 2019-08-14 DIAGNOSIS — Z79899 Other long term (current) drug therapy: Secondary | ICD-10-CM | POA: Diagnosis not present

## 2019-08-14 DIAGNOSIS — Z299 Encounter for prophylactic measures, unspecified: Secondary | ICD-10-CM | POA: Diagnosis not present

## 2019-08-14 DIAGNOSIS — K589 Irritable bowel syndrome without diarrhea: Secondary | ICD-10-CM | POA: Diagnosis not present

## 2019-08-14 DIAGNOSIS — I10 Essential (primary) hypertension: Secondary | ICD-10-CM | POA: Diagnosis not present

## 2019-08-14 DIAGNOSIS — R109 Unspecified abdominal pain: Secondary | ICD-10-CM | POA: Diagnosis not present

## 2019-08-14 LAB — SURGICAL PATHOLOGY

## 2019-08-15 ENCOUNTER — Encounter: Payer: Self-pay | Admitting: Internal Medicine

## 2019-08-15 DIAGNOSIS — G4733 Obstructive sleep apnea (adult) (pediatric): Secondary | ICD-10-CM | POA: Diagnosis not present

## 2019-08-16 ENCOUNTER — Encounter (HOSPITAL_COMMUNITY): Payer: Self-pay | Admitting: Internal Medicine

## 2019-08-24 DIAGNOSIS — Z1339 Encounter for screening examination for other mental health and behavioral disorders: Secondary | ICD-10-CM | POA: Diagnosis not present

## 2019-08-24 DIAGNOSIS — Z299 Encounter for prophylactic measures, unspecified: Secondary | ICD-10-CM | POA: Diagnosis not present

## 2019-08-24 DIAGNOSIS — Z79899 Other long term (current) drug therapy: Secondary | ICD-10-CM | POA: Diagnosis not present

## 2019-08-24 DIAGNOSIS — R5383 Other fatigue: Secondary | ICD-10-CM | POA: Diagnosis not present

## 2019-08-24 DIAGNOSIS — I10 Essential (primary) hypertension: Secondary | ICD-10-CM | POA: Diagnosis not present

## 2019-08-24 DIAGNOSIS — Z0000 Encounter for general adult medical examination without abnormal findings: Secondary | ICD-10-CM | POA: Diagnosis not present

## 2019-08-24 DIAGNOSIS — Z1331 Encounter for screening for depression: Secondary | ICD-10-CM | POA: Diagnosis not present

## 2019-08-24 DIAGNOSIS — E7800 Pure hypercholesterolemia, unspecified: Secondary | ICD-10-CM | POA: Diagnosis not present

## 2019-08-24 DIAGNOSIS — Z6841 Body Mass Index (BMI) 40.0 and over, adult: Secondary | ICD-10-CM | POA: Diagnosis not present

## 2019-08-24 DIAGNOSIS — J439 Emphysema, unspecified: Secondary | ICD-10-CM | POA: Diagnosis not present

## 2019-08-24 DIAGNOSIS — Z7189 Other specified counseling: Secondary | ICD-10-CM | POA: Diagnosis not present

## 2019-09-07 DIAGNOSIS — M170 Bilateral primary osteoarthritis of knee: Secondary | ICD-10-CM | POA: Diagnosis not present

## 2019-09-14 DIAGNOSIS — J439 Emphysema, unspecified: Secondary | ICD-10-CM | POA: Diagnosis not present

## 2019-09-14 DIAGNOSIS — M2550 Pain in unspecified joint: Secondary | ICD-10-CM | POA: Diagnosis not present

## 2019-09-14 DIAGNOSIS — Z299 Encounter for prophylactic measures, unspecified: Secondary | ICD-10-CM | POA: Diagnosis not present

## 2019-09-14 DIAGNOSIS — I10 Essential (primary) hypertension: Secondary | ICD-10-CM | POA: Diagnosis not present

## 2019-09-14 DIAGNOSIS — R7309 Other abnormal glucose: Secondary | ICD-10-CM | POA: Diagnosis not present

## 2019-10-04 DIAGNOSIS — E7800 Pure hypercholesterolemia, unspecified: Secondary | ICD-10-CM | POA: Diagnosis not present

## 2019-10-04 DIAGNOSIS — R040 Epistaxis: Secondary | ICD-10-CM | POA: Diagnosis not present

## 2019-10-15 DIAGNOSIS — J439 Emphysema, unspecified: Secondary | ICD-10-CM | POA: Diagnosis not present

## 2019-10-15 DIAGNOSIS — Z6841 Body Mass Index (BMI) 40.0 and over, adult: Secondary | ICD-10-CM | POA: Diagnosis not present

## 2019-10-15 DIAGNOSIS — F1120 Opioid dependence, uncomplicated: Secondary | ICD-10-CM | POA: Diagnosis not present

## 2019-10-15 DIAGNOSIS — M79609 Pain in unspecified limb: Secondary | ICD-10-CM | POA: Diagnosis not present

## 2019-10-15 DIAGNOSIS — I10 Essential (primary) hypertension: Secondary | ICD-10-CM | POA: Diagnosis not present

## 2019-10-15 DIAGNOSIS — Z299 Encounter for prophylactic measures, unspecified: Secondary | ICD-10-CM | POA: Diagnosis not present

## 2019-10-29 DIAGNOSIS — H401131 Primary open-angle glaucoma, bilateral, mild stage: Secondary | ICD-10-CM | POA: Diagnosis not present

## 2019-11-04 ENCOUNTER — Other Ambulatory Visit: Payer: Self-pay | Admitting: Gastroenterology

## 2019-11-04 DIAGNOSIS — R197 Diarrhea, unspecified: Secondary | ICD-10-CM

## 2019-11-07 DIAGNOSIS — M5020 Other cervical disc displacement, unspecified cervical region: Secondary | ICD-10-CM | POA: Diagnosis not present

## 2019-11-07 DIAGNOSIS — I10 Essential (primary) hypertension: Secondary | ICD-10-CM | POA: Diagnosis not present

## 2019-11-07 DIAGNOSIS — B370 Candidal stomatitis: Secondary | ICD-10-CM | POA: Diagnosis not present

## 2019-11-07 DIAGNOSIS — Z299 Encounter for prophylactic measures, unspecified: Secondary | ICD-10-CM | POA: Diagnosis not present

## 2019-11-07 DIAGNOSIS — B3781 Candidal esophagitis: Secondary | ICD-10-CM | POA: Diagnosis not present

## 2019-11-12 DIAGNOSIS — Z1231 Encounter for screening mammogram for malignant neoplasm of breast: Secondary | ICD-10-CM | POA: Diagnosis not present

## 2019-11-15 ENCOUNTER — Encounter: Payer: Self-pay | Admitting: Gastroenterology

## 2019-11-15 NOTE — Progress Notes (Signed)
Referring Provider: Kirstie Peri, MD Primary Care Physician:  Kirstie Peri, MD Primary GI Physician: Dr. Jena Gauss  Chief Complaint  Patient presents with  . Dysphagia    mostly doing fine    HPI:   Jenna Rivera is a 59 y.o. female presenting today for follow-up of dysphagia and diarrhea.  Last seen in our office 07/04/2019 for the same.  She has long history of solid food and pill dysphagia with occasional pill regurgitation.  Rare GERD symptoms.  Intermittent diarrhea since cholecystectomy in the 90s with up to 7-8 BMs per day when diarrhea occurs.  Abdominal pain prior to BMs resolve thereafter.  Using Imodium and probiotics as needed.  Described taking medication previously that sounded like Questran or Colestid which caused constipation.  Suspected bile salt diarrhea but will check serologies and TSH.  Also plan to try Bentyl, follow a low-fat diet, avoid items that cause gas/bloating, proceed with EGD, request colonoscopy records, and follow-up after procedure.  Labs completed 07/04/2019.  No evidence of celiac disease, TSH within normal limits.  Received colonoscopy report dated 12/05/2012 with Dr. Teena Dunk with mild diverticulosis in the sigmoid colon.  Recommended 10-year repeat.  EGD 08/13/2019: Normal examined esophagus s/p dilation, numerous gastric erosions in the antrum and gastric body s/p biopsy, patent pylorus, bulbar erosions, otherwise D1 and D2 appeared normal. Pathology with gastric antral and oxyntic mucosa, negative for H. pylori.  Recommended starting Protonix 40 mg daily.  Today:  Dysphagia: Improved. Continues with occasional symptoms, but much improved. Maybe once every few weeks.  She is taking Protonix daily.  No breakthrough GERD symptoms.  Protonix has also helped with occasional upper abdominal pain.  No nausea or vomiting.    Diarrhea: Much improved with Bentyl. Typically with 1-3 BMs daily that are more formed. This has also significantly improved her abdominal  pain. Taking 2-4 times a day. Rarely has constipation. Will hold the Bentyl in this case. She does have dry mouth at times but will increase water intake. Trying to limit fried/fatty/greasy foods. No brbpr or melena.   Chronic pain on her right knee, ankle, and foot  Past Medical History:  Diagnosis Date  . Acute bronchitis with COPD (HCC)   . Allergic rhinitis   . Anxiety   . Asthma   . Cervical disc herniation   . Chest pain   . Chronic pain   . COPD (chronic obstructive pulmonary disease) (HCC)   . Edema   . HTN (hypertension)   . Hyperlipidemia   . Lumbago   . Neck pain   . Osteoporosis   . Reflux esophagitis   . Rosacea   . Sleep apnea    uses CPAP    Past Surgical History:  Procedure Laterality Date  . ABDOMINAL HYSTERECTOMY    . CHOLECYSTECTOMY     1990s  . COLONOSCOPY  12/05/2012   [place]; Dr. Teena Dunk; sigmoid diverticulosis, otherwise normal exam.  Recommended repeat in 10 years.  . ESOPHAGOGASTRODUODENOSCOPY (EGD) WITH PROPOFOL N/A 08/13/2019   Procedure: ESOPHAGOGASTRODUODENOSCOPY (EGD) WITH PROPOFOL;  Surgeon: Corbin Ade, MD;  Normal examined esophagus s/p dilations, numerous gastric erosions in the antrum and gastric body s/p biopsy, patent pylorus, bulbar erosions, otherwise D1 and D2 appeared normal. Pathology with gastric antral and oxyntic mucosa, negative for H. pylori.   Marland Kitchen MALONEY DILATION N/A 08/13/2019   Procedure: Elease Hashimoto DILATION;  Surgeon: Corbin Ade, MD;  Location: [facility];  Service: Endoscopy;  Laterality: N/A;  . MOUTH SURGERY    .  TUBAL LIGATION      Current Outpatient Medications  Medication Sig Dispense Refill  . acetaminophen (TYLENOL) 500 MG tablet Take 500-1,000 mg by mouth every 6 (six) hours as needed (for pain.).    Marland Kitchen albuterol (PROAIR HFA) 108 (90 Base) MCG/ACT inhaler Inhale 2 puffs into the lungs every 6 (six) hours as needed for wheezing or shortness of breath.    Marland Kitchen albuterol (PROVENTIL) (2.5 MG/3ML) 0.083% nebulizer  solution Inhale 3 mLs into the lungs every 6 (six) hours as needed (wheezing/shortness of breath.).     Marland Kitchen Ascorbic Acid (VITAMIN C) 1000 MG tablet Take 1,000 mg by mouth daily.    . budesonide-formoterol (SYMBICORT) 160-4.5 MCG/ACT inhaler Inhale 2 puffs into the lungs 2 (two) times daily.    . calcium carbonate (OSCAL) 1500 (600 Ca) MG TABS tablet Take 600 mg by mouth daily.    . cholecalciferol (VITAMIN D) 25 MCG (1000 UNIT) tablet Take 1,000 Units by mouth daily.    Marland Kitchen dicyclomine (BENTYL) 10 MG capsule TAKE 1 CAPSULE BY MOUTH FOUR TIMES DAILY - BEFORE MEALS AND AT BEDTIME 120 capsule 2  . fluticasone (FLONASE) 50 MCG/ACT nasal spray Place 2 sprays into both nostrils daily at 12 noon.     . furosemide (LASIX) 40 MG tablet Take 20-40 mg by mouth daily as needed (fluid retention.).     Marland Kitchen gabapentin (NEURONTIN) 300 MG capsule Take 300 mg by mouth 2 (two) times daily.    Marland Kitchen HYDROcodone-acetaminophen (NORCO) 7.5-325 MG tablet Take 1 tablet by mouth in the morning, at noon, in the evening, and at bedtime.     Marland Kitchen ibuprofen (ADVIL) 200 MG tablet Take 200-400 mg by mouth every 8 (eight) hours as needed (for pain.).    Marland Kitchen ipratropium-albuterol (DUONEB) 0.5-2.5 (3) MG/3ML SOLN Take 3 mLs by nebulization every 4 (four) hours as needed (wheezing/shortness of breath.).     Marland Kitchen latanoprost (XALATAN) 0.005 % ophthalmic solution Place 1 drop into both eyes at bedtime.     Marland Kitchen lisinopril (ZESTRIL) 5 MG tablet Take 5 mg by mouth daily.    Marland Kitchen LORazepam (ATIVAN) 1 MG tablet Take 1 mg by mouth 2 (two) times daily as needed for anxiety.     . montelukast (SINGULAIR) 10 MG tablet Take 10 mg by mouth at bedtime.    . Multiple Vitamin (MULTIVITAMIN WITH MINERALS) TABS tablet Take 1 tablet by mouth daily.    . pantoprazole (PROTONIX) 40 MG tablet Take 40 mg by mouth daily.    . potassium chloride (K-DUR) 10 MEQ tablet Take 10-20 mEq by mouth daily as needed (fluid retention.).     Marland Kitchen tiZANidine (ZANAFLEX) 4 MG tablet Take 4 mg by  mouth every 6 (six) hours as needed for muscle spasms.     No current facility-administered medications for this visit.    Allergies as of 11/16/2019  . (No Known Allergies)    Family History  Problem Relation Age of Onset  . COPD Mother   . Cancer Father   . Breast cancer Sister   . Lung cancer Sister   . Multiple sclerosis Sister   . Colon cancer Neg Hx        family history is limited.     Social History   Socioeconomic History  . Marital status: Married    Spouse name: Not on file  . Number of children: Not on file  . Years of education: Not on file  . Highest education level: Not on file  Occupational  History  . Not on file  Tobacco Use  . Smoking status: Former Smoker    Packs/day: 1.00    Years: 15.00    Pack years: 15.00    Types: Cigarettes    Quit date: 01/05/1991    Years since quitting: 28.8  . Smokeless tobacco: Never Used  Vaping Use  . Vaping Use: Never used  Substance and Sexual Activity  . Alcohol use: Yes    Comment: occasional  . Drug use: Never  . Sexual activity: Not on file  Other Topics Concern  . Not on file  Social History Narrative  . Not on file   Social Determinants of Health   Financial Resource Strain:   . Difficulty of Paying Living Expenses: Not on file  Food Insecurity:   . Worried About Programme researcher, broadcasting/film/video in the Last Year: Not on file  . Ran Out of Food in the Last Year: Not on file  Transportation Needs:   . Lack of Transportation (Medical): Not on file  . Lack of Transportation (Non-Medical): Not on file  Physical Activity:   . Days of Exercise per Week: Not on file  . Minutes of Exercise per Session: Not on file  Stress:   . Feeling of Stress : Not on file  Social Connections:   . Frequency of Communication with Friends and Family: Not on file  . Frequency of Social Gatherings with Friends and Family: Not on file  . Attends Religious Services: Not on file  . Active Member of Clubs or Organizations: Not on file    . Attends Banker Meetings: Not on file  . Marital Status: Not on file    Review of Systems: Gen: Denies fever, chills, cold or flulike symptoms, lightheadedness, dizziness, presyncope, syncope. CV: Denies chest pain or palpitations. Resp: Admits to chronic SOB with exertion. Occasional cough.  GI: See HPI Heme: See HPI  Physical Exam: BP (!) 95/42   Pulse (!) 58   Temp (!) 97 F (36.1 C)   Ht 5\' 1"  (1.549 m)   Wt 297 lb 6.4 oz (134.9 kg)   BMI 56.19 kg/m  General:   Alert and oriented. No distress noted. Pleasant and cooperative.  Head:  Normocephalic and atraumatic. Eyes:  Conjuctiva clear without scleral icterus. Heart:  Distant heart sounds. Difficult to assess due to body habitus. S1, S2 present.  Lungs:  Clear to auscultation bilaterally. No wheezes, rales, or rhonchi. No distress.  Abdomen:  Obese, +BS, soft, non-tender and non-distended. No rebound or guarding. No HSM or masses noted. Msk:  Symmetrical without gross deformities. Normal posture. Extremities:  With 1+ bilateral LE pitting edema up to mid shin. Neurologic:  Alert and  oriented x4 Psych:  Normal mood and affect.

## 2019-11-16 ENCOUNTER — Ambulatory Visit: Payer: PPO | Admitting: Gastroenterology

## 2019-11-16 ENCOUNTER — Other Ambulatory Visit: Payer: Self-pay

## 2019-11-16 ENCOUNTER — Encounter: Payer: Self-pay | Admitting: Gastroenterology

## 2019-11-16 VITALS — BP 95/42 | HR 58 | Temp 97.0°F | Ht 61.0 in | Wt 297.4 lb

## 2019-11-16 DIAGNOSIS — R197 Diarrhea, unspecified: Secondary | ICD-10-CM

## 2019-11-16 DIAGNOSIS — K219 Gastro-esophageal reflux disease without esophagitis: Secondary | ICD-10-CM | POA: Diagnosis not present

## 2019-11-16 DIAGNOSIS — R1310 Dysphagia, unspecified: Secondary | ICD-10-CM

## 2019-11-16 NOTE — Assessment & Plan Note (Signed)
Much improved s/p EGD in August 2021 which revealed normal examined esophagus s/p empiric dilation.  She does note occasional dysphagia symptoms to pills or solid foods, but this is fairly infrequent. She was also started on PPI after EGD which she notes has resolved GERD symptoms. Hopefully, dysphagia will continue to improve with daily PPI.   Advise she continue to monitor symptoms for now and let us know of any worsening.  We will follow up with her in 6 months.

## 2019-11-16 NOTE — Assessment & Plan Note (Signed)
Chronic history of intermittent diarrhea dating back to the 90s s/p cholecystectomy.  Suspect symptoms are secondary to bile salt diarrhea with possible component of IBS-D as she had also noted abdominal cramping prior to BMs that resolved thereafter.  Symptoms are much improved with Bentyl 2-4 times daily.  Prior trial of Questran or Colestid by PCP resulted in constipation.  No alarm symptoms.  Colonoscopy up-to-date in December 2014 with Dr. Teena Dunk, due for repeat colonoscopy in 2024.  Plan:  Continue Bentyl 10 mg up to 3 times daily before meals and at bedtime as needed.  Hold in the setting of constipation. Continue working on limiting fried/fatty/greasy foods. Follow-up in 6 months.

## 2019-11-16 NOTE — Assessment & Plan Note (Addendum)
Patient had reported rare GERD symptoms; however, EGD in August 2021 with numerous gastric erosions in the antrum and bulbar erosions.  Pathology with gastric antral oxyntic mucosa, negative for H. pylori.  She was started on Protonix 40 mg daily and notes very good control of GERD symptoms with resolution of occasional upper abdominal pain.   Advised she continue Protonix 40 mg daily and follow-up in 6 months.

## 2019-11-16 NOTE — Patient Instructions (Signed)
Continue Protonix 40 mg daily 30 minutes before breakfast.  Continue Bentyl up to 3 times daily before meals and at bedtime.  Hold in the setting of constipation.  Continue to work towards limiting fried/fatty/greasy foods as this will likely worsen diarrhea I do not have a gallbladder.  We will plan to see back in 6 months.  Do not hesitate to call if he has questions or concerns prior.  Was great seeing you today!  Ermalinda Memos, PA-C [facility]

## 2019-12-04 DIAGNOSIS — E7800 Pure hypercholesterolemia, unspecified: Secondary | ICD-10-CM | POA: Diagnosis not present

## 2019-12-04 DIAGNOSIS — R040 Epistaxis: Secondary | ICD-10-CM | POA: Diagnosis not present

## 2019-12-06 DIAGNOSIS — Z6841 Body Mass Index (BMI) 40.0 and over, adult: Secondary | ICD-10-CM | POA: Diagnosis not present

## 2019-12-06 DIAGNOSIS — I10 Essential (primary) hypertension: Secondary | ICD-10-CM | POA: Diagnosis not present

## 2019-12-06 DIAGNOSIS — M2550 Pain in unspecified joint: Secondary | ICD-10-CM | POA: Diagnosis not present

## 2019-12-06 DIAGNOSIS — Z299 Encounter for prophylactic measures, unspecified: Secondary | ICD-10-CM | POA: Diagnosis not present

## 2019-12-06 DIAGNOSIS — F419 Anxiety disorder, unspecified: Secondary | ICD-10-CM | POA: Diagnosis not present

## 2019-12-06 DIAGNOSIS — B3781 Candidal esophagitis: Secondary | ICD-10-CM | POA: Diagnosis not present

## 2019-12-11 DIAGNOSIS — M47816 Spondylosis without myelopathy or radiculopathy, lumbar region: Secondary | ICD-10-CM | POA: Diagnosis not present

## 2019-12-11 DIAGNOSIS — M170 Bilateral primary osteoarthritis of knee: Secondary | ICD-10-CM | POA: Diagnosis not present

## 2020-01-04 DIAGNOSIS — R040 Epistaxis: Secondary | ICD-10-CM | POA: Diagnosis not present

## 2020-01-04 DIAGNOSIS — E7800 Pure hypercholesterolemia, unspecified: Secondary | ICD-10-CM | POA: Diagnosis not present

## 2020-01-07 DIAGNOSIS — M2550 Pain in unspecified joint: Secondary | ICD-10-CM | POA: Diagnosis not present

## 2020-01-07 DIAGNOSIS — F419 Anxiety disorder, unspecified: Secondary | ICD-10-CM | POA: Diagnosis not present

## 2020-01-07 DIAGNOSIS — Z299 Encounter for prophylactic measures, unspecified: Secondary | ICD-10-CM | POA: Diagnosis not present

## 2020-01-07 DIAGNOSIS — M25561 Pain in right knee: Secondary | ICD-10-CM | POA: Diagnosis not present

## 2020-02-04 DIAGNOSIS — R040 Epistaxis: Secondary | ICD-10-CM | POA: Diagnosis not present

## 2020-02-04 DIAGNOSIS — E7800 Pure hypercholesterolemia, unspecified: Secondary | ICD-10-CM | POA: Diagnosis not present

## 2020-02-06 DIAGNOSIS — Z6841 Body Mass Index (BMI) 40.0 and over, adult: Secondary | ICD-10-CM | POA: Diagnosis not present

## 2020-02-06 DIAGNOSIS — Z299 Encounter for prophylactic measures, unspecified: Secondary | ICD-10-CM | POA: Diagnosis not present

## 2020-02-06 DIAGNOSIS — I10 Essential (primary) hypertension: Secondary | ICD-10-CM | POA: Diagnosis not present

## 2020-02-06 DIAGNOSIS — F1120 Opioid dependence, uncomplicated: Secondary | ICD-10-CM | POA: Diagnosis not present

## 2020-02-06 DIAGNOSIS — Z87891 Personal history of nicotine dependence: Secondary | ICD-10-CM | POA: Diagnosis not present

## 2020-02-06 DIAGNOSIS — Z79899 Other long term (current) drug therapy: Secondary | ICD-10-CM | POA: Diagnosis not present

## 2020-02-06 DIAGNOSIS — K219 Gastro-esophageal reflux disease without esophagitis: Secondary | ICD-10-CM | POA: Diagnosis not present

## 2020-02-06 DIAGNOSIS — J439 Emphysema, unspecified: Secondary | ICD-10-CM | POA: Diagnosis not present

## 2020-02-06 DIAGNOSIS — M2550 Pain in unspecified joint: Secondary | ICD-10-CM | POA: Diagnosis not present

## 2020-03-03 DIAGNOSIS — E7800 Pure hypercholesterolemia, unspecified: Secondary | ICD-10-CM | POA: Diagnosis not present

## 2020-03-03 DIAGNOSIS — R040 Epistaxis: Secondary | ICD-10-CM | POA: Diagnosis not present

## 2020-03-06 DIAGNOSIS — M2550 Pain in unspecified joint: Secondary | ICD-10-CM | POA: Diagnosis not present

## 2020-03-06 DIAGNOSIS — Z299 Encounter for prophylactic measures, unspecified: Secondary | ICD-10-CM | POA: Diagnosis not present

## 2020-03-06 DIAGNOSIS — I10 Essential (primary) hypertension: Secondary | ICD-10-CM | POA: Diagnosis not present

## 2020-03-06 DIAGNOSIS — J439 Emphysema, unspecified: Secondary | ICD-10-CM | POA: Diagnosis not present

## 2020-03-06 DIAGNOSIS — Z6841 Body Mass Index (BMI) 40.0 and over, adult: Secondary | ICD-10-CM | POA: Diagnosis not present

## 2020-03-10 DIAGNOSIS — M170 Bilateral primary osteoarthritis of knee: Secondary | ICD-10-CM | POA: Diagnosis not present

## 2020-04-02 DIAGNOSIS — E7800 Pure hypercholesterolemia, unspecified: Secondary | ICD-10-CM | POA: Diagnosis not present

## 2020-04-02 DIAGNOSIS — E1165 Type 2 diabetes mellitus with hyperglycemia: Secondary | ICD-10-CM | POA: Diagnosis not present

## 2020-04-07 DIAGNOSIS — M2550 Pain in unspecified joint: Secondary | ICD-10-CM | POA: Diagnosis not present

## 2020-04-07 DIAGNOSIS — Z87891 Personal history of nicotine dependence: Secondary | ICD-10-CM | POA: Diagnosis not present

## 2020-04-07 DIAGNOSIS — G4730 Sleep apnea, unspecified: Secondary | ICD-10-CM | POA: Diagnosis not present

## 2020-04-07 DIAGNOSIS — J439 Emphysema, unspecified: Secondary | ICD-10-CM | POA: Diagnosis not present

## 2020-04-07 DIAGNOSIS — F1120 Opioid dependence, uncomplicated: Secondary | ICD-10-CM | POA: Diagnosis not present

## 2020-04-07 DIAGNOSIS — I10 Essential (primary) hypertension: Secondary | ICD-10-CM | POA: Diagnosis not present

## 2020-04-07 DIAGNOSIS — Z299 Encounter for prophylactic measures, unspecified: Secondary | ICD-10-CM | POA: Diagnosis not present

## 2020-04-30 DIAGNOSIS — H401131 Primary open-angle glaucoma, bilateral, mild stage: Secondary | ICD-10-CM | POA: Diagnosis not present

## 2020-05-07 DIAGNOSIS — Z79899 Other long term (current) drug therapy: Secondary | ICD-10-CM | POA: Diagnosis not present

## 2020-05-07 DIAGNOSIS — Z299 Encounter for prophylactic measures, unspecified: Secondary | ICD-10-CM | POA: Diagnosis not present

## 2020-05-07 DIAGNOSIS — F1120 Opioid dependence, uncomplicated: Secondary | ICD-10-CM | POA: Diagnosis not present

## 2020-05-07 DIAGNOSIS — M2550 Pain in unspecified joint: Secondary | ICD-10-CM | POA: Diagnosis not present

## 2020-05-07 DIAGNOSIS — F419 Anxiety disorder, unspecified: Secondary | ICD-10-CM | POA: Diagnosis not present

## 2020-05-07 DIAGNOSIS — I10 Essential (primary) hypertension: Secondary | ICD-10-CM | POA: Diagnosis not present

## 2020-05-14 NOTE — Progress Notes (Signed)
Referring Provider: Kirstie Peri, MD Primary Care Physician:  Kirstie Peri, MD Primary GI Physician: Dr. Jena Gauss  Chief Complaint  Patient presents with  . Diarrhea    Alternates with constipation. Has changed diet recently. Diarrhea not as bad as prior. Eating less fried foods    HPI:   Jenna Rivera is a 60 y.o. female presenting today for follow-up. History of occasional GERD, dysphagia, chronic intermittent diarrhea since cholecystectomy in the 90s with associated abdominal pain prior to BMs that improves thereafter.  Previously described taking medications that I suspect were Questran or Colestid which caused constipation.  Celiac screen negative and TSH within normal limits.  Now on Bentyl.  EGD August 2021 with normal esophagus s/p empiric dilation, numerous gastric erosions in the antrum and gastric body with biopsies negative for H. pylori, bulbar erosions.  She was started on Protonix 40 mg daily at that time.  Colonoscopy up-to-date in December 2014, due for repeat in 2024.   Last seen in our office 11/16/2019.  Dysphagia improved s/p dilation.  Rare intermittent symptoms.  GERD well controlled on Protonix daily.  Upper abdominal pain improved.  No nausea or vomiting.  Diarrhea much improved with Bentyl 2-4 times daily. Bentyl also improved her abdominal pain.  Advised that she continue her current medications and follow-up in 6 months.  Today:  Diarrhea: Usually with 1-2 BMs daily. Formed. Has skipped a day here and there. Occasional flare with 4-5 BMs per day. Maybe once a month. Taking 2 Bentyl a day. Will take extra if having more stool. Holding in the setting of occasional constipation. No blood in the stool or black stool.   No abdominal pain.   GERD: Taking Protonix 40 mg daily. Well controlled. Rare dysphagia symptoms.  Does not feel this needs to be evaluated any further at this time.  Ibuprofen daily to every other day for chronic knee pain.  Tylenol not helpful.   No other NSAIDs.  Weight loss: Intentional. Limiting fried/fatty foods. Lean meats. Eating healthier. Cabbage/increased fiber. On a low carb diet. Some increased gas with increased fiber. Taking gas x as needed.    Past Medical History:  Diagnosis Date  . Acute bronchitis with COPD (HCC)   . Allergic rhinitis   . Anxiety   . Asthma   . Cervical disc herniation   . Chest pain   . Chronic pain   . COPD (chronic obstructive pulmonary disease) (HCC)   . Edema   . HTN (hypertension)   . Hyperlipidemia   . Lumbago   . Neck pain   . Osteoporosis   . Reflux esophagitis   . Rosacea   . Sleep apnea    uses CPAP    Past Surgical History:  Procedure Laterality Date  . ABDOMINAL HYSTERECTOMY    . CHOLECYSTECTOMY     1990s  . COLONOSCOPY  12/05/2012   [facility]; Dr. Teena Dunk; sigmoid diverticulosis, otherwise normal exam.  Recommended repeat in 10 years.  . ESOPHAGOGASTRODUODENOSCOPY (EGD) WITH PROPOFOL N/A 08/13/2019   Procedure: ESOPHAGOGASTRODUODENOSCOPY (EGD) WITH PROPOFOL;  Surgeon: Corbin Ade, MD;  Normal examined esophagus s/p dilations, numerous gastric erosions in the antrum and gastric body s/p biopsy, patent pylorus, bulbar erosions, otherwise D1 and D2 appeared normal. Pathology with gastric antral and oxyntic mucosa, negative for H. pylori.   Marland Kitchen MALONEY DILATION N/A 08/13/2019   Procedure: Elease Hashimoto DILATION;  Surgeon: Corbin Ade, MD;  Location: [facility];  Service: Endoscopy;  Laterality: N/A;  .  MOUTH SURGERY    . TUBAL LIGATION      Current Outpatient Medications  Medication Sig Dispense Refill  . acetaminophen (TYLENOL) 500 MG tablet Take 500-1,000 mg by mouth every 6 (six) hours as needed (for pain.).    Marland Kitchen albuterol (PROVENTIL) (2.5 MG/3ML) 0.083% nebulizer solution Inhale 3 mLs into the lungs every 6 (six) hours as needed (wheezing/shortness of breath.).     Marland Kitchen albuterol (VENTOLIN HFA) 108 (90 Base) MCG/ACT inhaler Inhale 2 puffs into the lungs every 6 (six) hours  as needed for wheezing or shortness of breath.    . Ascorbic Acid (VITAMIN C) 1000 MG tablet Take 1,000 mg by mouth daily.    . budesonide-formoterol (SYMBICORT) 160-4.5 MCG/ACT inhaler Inhale 2 puffs into the lungs 2 (two) times daily.    . calcium carbonate (OSCAL) 1500 (600 Ca) MG TABS tablet Take 600 mg by mouth daily.    . cholecalciferol (VITAMIN D) 25 MCG (1000 UNIT) tablet Take 1,000 Units by mouth daily.    Marland Kitchen dicyclomine (BENTYL) 10 MG capsule TAKE 1 CAPSULE BY MOUTH FOUR TIMES DAILY - BEFORE MEALS AND AT BEDTIME (Patient taking differently: Take 10 mg by mouth in the morning and at bedtime. Will occas take 3rd time if having diarrhea) 120 capsule 2  . fluticasone (FLONASE) 50 MCG/ACT nasal spray Place 2 sprays into both nostrils daily at 12 noon. As needed    . furosemide (LASIX) 40 MG tablet Take 20-40 mg by mouth daily as needed (fluid retention.).     Marland Kitchen gabapentin (NEURONTIN) 300 MG capsule Take 300 mg by mouth 2 (two) times daily.    Marland Kitchen HYDROcodone-acetaminophen (NORCO) 7.5-325 MG tablet Take 1 tablet by mouth in the morning, at noon, in the evening, and at bedtime.    Marland Kitchen ibuprofen (ADVIL) 200 MG tablet Take 200-400 mg by mouth every 8 (eight) hours as needed (for pain.).    Marland Kitchen ipratropium-albuterol (DUONEB) 0.5-2.5 (3) MG/3ML SOLN Take 3 mLs by nebulization every 4 (four) hours as needed (wheezing/shortness of breath.).     Marland Kitchen latanoprost (XALATAN) 0.005 % ophthalmic solution Place 1 drop into both eyes at bedtime.     Marland Kitchen lisinopril (ZESTRIL) 5 MG tablet Take 5 mg by mouth daily.    Marland Kitchen LORazepam (ATIVAN) 1 MG tablet Take 1 mg by mouth 2 (two) times daily as needed for anxiety.     . montelukast (SINGULAIR) 10 MG tablet Take 10 mg by mouth at bedtime.    . Multiple Vitamin (MULTIVITAMIN WITH MINERALS) TABS tablet Take 1 tablet by mouth daily.    . pantoprazole (PROTONIX) 40 MG tablet Take 40 mg by mouth daily.    . potassium chloride (K-DUR) 10 MEQ tablet Take 10-20 mEq by mouth daily as  needed (fluid retention.).     Marland Kitchen tiZANidine (ZANAFLEX) 4 MG tablet Take 4 mg by mouth every 6 (six) hours as needed for muscle spasms.     No current facility-administered medications for this visit.    Allergies as of 05/15/2020  . (No Known Allergies)    Family History  Problem Relation Age of Onset  . COPD Mother   . Cancer Father   . Breast cancer Sister   . Lung cancer Sister   . Multiple sclerosis Sister   . Colon cancer Neg Hx        family history is limited.     Social History   Socioeconomic History  . Marital status: Married    Spouse name: Not  on file  . Number of children: Not on file  . Years of education: Not on file  . Highest education level: Not on file  Occupational History  . Not on file  Tobacco Use  . Smoking status: Former Smoker    Packs/day: 1.00    Years: 15.00    Pack years: 15.00    Types: Cigarettes    Quit date: 01/05/1991    Years since quitting: 29.3  . Smokeless tobacco: Never Used  Vaping Use  . Vaping Use: Never used  Substance and Sexual Activity  . Alcohol use: Yes    Comment: occasional  . Drug use: Never  . Sexual activity: Not on file  Other Topics Concern  . Not on file  Social History Narrative  . Not on file   Social Determinants of Health   Financial Resource Strain: Not on file  Food Insecurity: Not on file  Transportation Needs: Not on file  Physical Activity: Not on file  Stress: Not on file  Social Connections: Not on file    Review of Systems: Gen: Denies fever, chills, cold or flulike symptoms, lightheadedness, dizziness, presyncope, syncope. CV: Denies chest pain or palpitations. Resp: Denies dyspnea or cough. GI: See HPI Heme: See HPI  Physical Exam: BP (!) 142/58   Pulse (!) 55   Temp (!) 97.1 F (36.2 C)   Ht 5\' 1"  (1.549 m)   Wt 257 lb 12.8 oz (116.9 kg)   BMI 48.71 kg/m  General:   Alert and oriented. No distress noted. Pleasant and cooperative.  Head:  Normocephalic and  atraumatic. Eyes:  Conjuctiva clear without scleral icterus. Heart:  S1, S2 present without murmurs appreciated. Lungs:  Clear to auscultation bilaterally. No wheezes, rales, or rhonchi. No distress.  Abdomen:  +BS. Obese, soft, non-tender. No rebound or guarding. No HSM or masses noted. Msk:  Symmetrical without gross deformities. Normal posture. Extremities:  Without edema. Neurologic:  Alert and  oriented x4 Psych:  Normal mood and affect.

## 2020-05-15 ENCOUNTER — Encounter: Payer: Self-pay | Admitting: Gastroenterology

## 2020-05-15 ENCOUNTER — Ambulatory Visit: Payer: PPO | Admitting: Gastroenterology

## 2020-05-15 ENCOUNTER — Other Ambulatory Visit: Payer: Self-pay

## 2020-05-15 VITALS — BP 142/58 | HR 55 | Temp 97.1°F | Ht 61.0 in | Wt 257.8 lb

## 2020-05-15 DIAGNOSIS — K219 Gastro-esophageal reflux disease without esophagitis: Secondary | ICD-10-CM | POA: Diagnosis not present

## 2020-05-15 DIAGNOSIS — R197 Diarrhea, unspecified: Secondary | ICD-10-CM | POA: Diagnosis not present

## 2020-05-15 DIAGNOSIS — R1310 Dysphagia, unspecified: Secondary | ICD-10-CM | POA: Diagnosis not present

## 2020-05-15 DIAGNOSIS — Z791 Long term (current) use of non-steroidal anti-inflammatories (NSAID): Secondary | ICD-10-CM | POA: Diagnosis not present

## 2020-05-15 NOTE — Assessment & Plan Note (Signed)
Chronic history of intermittent diarrhea dating back to the 90s s/p cholecystectomy. TSH normal and celiac screen negative. Suspect bile salt diarrhea and component of IBS-D as she also noted abdominal cramping prior to BMs that improved thereafter.  Symptoms continue to be well controlled on Bentyl, typically 2 daily.  Occasional flares where she will take an additional Bentyl and holding Bentyl as needed for occasional constipation.  Prior trial of Questran or Colestid with PCP resulted in constipation.  No alarm symptoms.  Colonoscopy up-to-date, due for repeat in 2024.  Plan: Continue Bentyl 10 mg up to 4 times daily as needed.  Hold in the setting of constipation. Continue to avoid fried, fatty, greasy foods. Follow-up in 1 year or sooner if needed.

## 2020-05-15 NOTE — Patient Instructions (Addendum)
Continue dicyclomine up to 4 times daily as needed for diarrhea and abdominal cramping.  Hold in the setting of constipation.  Continue to avoid fried, fatty, greasy foods as these will worsen diarrhea due to prior gallbladder surgery.  Continue Protonix 40 mg daily 30 minutes before breakfast.  Monitor for any worsening swallowing trouble and let me know if this occurs.  I am glad you are doing well!  Congratulations on your weight loss!  You are doing fantastic!  We will plan to see you back in 1 year or sooner if needed. Do not hesitate to call with questions or concerns prior.   Ermalinda Memos, PA-C [facility]

## 2020-05-15 NOTE — Assessment & Plan Note (Signed)
Chronic NSAID use in the setting of chronic knee pain.  Reports Tylenol is not helpful.  EGD in August 2021 with gastric erosions, not on a PPI at that time.  She has since been on Protonix 40 mg daily and is clinically doing very well.  No alarm symptoms.  We will continue PPI daily.  Advised to limit NSAID products as she is able.

## 2020-05-15 NOTE — Assessment & Plan Note (Signed)
Overall, symptoms remain much improved s/p esophageal dilation in August 2021.  Occasional symptoms, nothing significant, and she does not feel this needs any further evaluation at this time.  She will continue to monitor and let me know if any worsening.

## 2020-05-15 NOTE — Assessment & Plan Note (Signed)
Well-controlled on Protonix 40 mg daily.  No alarm symptoms.  Advised to continue her current medications.  Follow-up in 1 year.

## 2020-05-23 ENCOUNTER — Other Ambulatory Visit: Payer: Self-pay | Admitting: Nurse Practitioner

## 2020-05-23 DIAGNOSIS — R197 Diarrhea, unspecified: Secondary | ICD-10-CM

## 2020-06-05 DIAGNOSIS — Z299 Encounter for prophylactic measures, unspecified: Secondary | ICD-10-CM | POA: Diagnosis not present

## 2020-06-05 DIAGNOSIS — I10 Essential (primary) hypertension: Secondary | ICD-10-CM | POA: Diagnosis not present

## 2020-06-05 DIAGNOSIS — L02411 Cutaneous abscess of right axilla: Secondary | ICD-10-CM | POA: Diagnosis not present

## 2020-06-05 DIAGNOSIS — Z789 Other specified health status: Secondary | ICD-10-CM | POA: Diagnosis not present

## 2020-06-05 DIAGNOSIS — M5020 Other cervical disc displacement, unspecified cervical region: Secondary | ICD-10-CM | POA: Diagnosis not present

## 2020-06-05 DIAGNOSIS — J439 Emphysema, unspecified: Secondary | ICD-10-CM | POA: Diagnosis not present

## 2020-06-10 DIAGNOSIS — M47816 Spondylosis without myelopathy or radiculopathy, lumbar region: Secondary | ICD-10-CM | POA: Diagnosis not present

## 2020-06-10 DIAGNOSIS — M170 Bilateral primary osteoarthritis of knee: Secondary | ICD-10-CM | POA: Diagnosis not present

## 2020-07-03 DIAGNOSIS — E7849 Other hyperlipidemia: Secondary | ICD-10-CM | POA: Diagnosis not present

## 2020-07-03 DIAGNOSIS — I4891 Unspecified atrial fibrillation: Secondary | ICD-10-CM | POA: Diagnosis not present

## 2020-07-03 DIAGNOSIS — I10 Essential (primary) hypertension: Secondary | ICD-10-CM | POA: Diagnosis not present

## 2020-07-08 DIAGNOSIS — J9611 Chronic respiratory failure with hypoxia: Secondary | ICD-10-CM | POA: Diagnosis not present

## 2020-07-08 DIAGNOSIS — Z6841 Body Mass Index (BMI) 40.0 and over, adult: Secondary | ICD-10-CM | POA: Diagnosis not present

## 2020-07-08 DIAGNOSIS — L02411 Cutaneous abscess of right axilla: Secondary | ICD-10-CM | POA: Diagnosis not present

## 2020-07-08 DIAGNOSIS — Z299 Encounter for prophylactic measures, unspecified: Secondary | ICD-10-CM | POA: Diagnosis not present

## 2020-07-08 DIAGNOSIS — M5020 Other cervical disc displacement, unspecified cervical region: Secondary | ICD-10-CM | POA: Diagnosis not present

## 2020-07-08 DIAGNOSIS — J439 Emphysema, unspecified: Secondary | ICD-10-CM | POA: Diagnosis not present

## 2020-07-08 DIAGNOSIS — I10 Essential (primary) hypertension: Secondary | ICD-10-CM | POA: Diagnosis not present

## 2020-08-05 DIAGNOSIS — Z299 Encounter for prophylactic measures, unspecified: Secondary | ICD-10-CM | POA: Diagnosis not present

## 2020-08-05 DIAGNOSIS — Z6841 Body Mass Index (BMI) 40.0 and over, adult: Secondary | ICD-10-CM | POA: Diagnosis not present

## 2020-08-05 DIAGNOSIS — I10 Essential (primary) hypertension: Secondary | ICD-10-CM | POA: Diagnosis not present

## 2020-08-05 DIAGNOSIS — M5020 Other cervical disc displacement, unspecified cervical region: Secondary | ICD-10-CM | POA: Diagnosis not present

## 2020-08-05 DIAGNOSIS — F419 Anxiety disorder, unspecified: Secondary | ICD-10-CM | POA: Diagnosis not present

## 2020-08-25 DIAGNOSIS — Z789 Other specified health status: Secondary | ICD-10-CM | POA: Diagnosis not present

## 2020-08-25 DIAGNOSIS — Z1331 Encounter for screening for depression: Secondary | ICD-10-CM | POA: Diagnosis not present

## 2020-08-25 DIAGNOSIS — I10 Essential (primary) hypertension: Secondary | ICD-10-CM | POA: Diagnosis not present

## 2020-08-25 DIAGNOSIS — Z0000 Encounter for general adult medical examination without abnormal findings: Secondary | ICD-10-CM | POA: Diagnosis not present

## 2020-08-25 DIAGNOSIS — R5383 Other fatigue: Secondary | ICD-10-CM | POA: Diagnosis not present

## 2020-08-25 DIAGNOSIS — M5020 Other cervical disc displacement, unspecified cervical region: Secondary | ICD-10-CM | POA: Diagnosis not present

## 2020-08-25 DIAGNOSIS — Z299 Encounter for prophylactic measures, unspecified: Secondary | ICD-10-CM | POA: Diagnosis not present

## 2020-08-25 DIAGNOSIS — E7800 Pure hypercholesterolemia, unspecified: Secondary | ICD-10-CM | POA: Diagnosis not present

## 2020-08-25 DIAGNOSIS — Z7189 Other specified counseling: Secondary | ICD-10-CM | POA: Diagnosis not present

## 2020-08-25 DIAGNOSIS — Z1339 Encounter for screening examination for other mental health and behavioral disorders: Secondary | ICD-10-CM | POA: Diagnosis not present

## 2020-08-25 DIAGNOSIS — Z79899 Other long term (current) drug therapy: Secondary | ICD-10-CM | POA: Diagnosis not present

## 2020-08-25 DIAGNOSIS — Z6841 Body Mass Index (BMI) 40.0 and over, adult: Secondary | ICD-10-CM | POA: Diagnosis not present

## 2020-09-04 DIAGNOSIS — M2352 Chronic instability of knee, left knee: Secondary | ICD-10-CM | POA: Diagnosis not present

## 2020-09-04 DIAGNOSIS — M170 Bilateral primary osteoarthritis of knee: Secondary | ICD-10-CM | POA: Diagnosis not present

## 2020-10-02 DIAGNOSIS — M2550 Pain in unspecified joint: Secondary | ICD-10-CM | POA: Diagnosis not present

## 2020-10-02 DIAGNOSIS — Z6841 Body Mass Index (BMI) 40.0 and over, adult: Secondary | ICD-10-CM | POA: Diagnosis not present

## 2020-10-02 DIAGNOSIS — Z713 Dietary counseling and surveillance: Secondary | ICD-10-CM | POA: Diagnosis not present

## 2020-10-02 DIAGNOSIS — Z23 Encounter for immunization: Secondary | ICD-10-CM | POA: Diagnosis not present

## 2020-10-02 DIAGNOSIS — I10 Essential (primary) hypertension: Secondary | ICD-10-CM | POA: Diagnosis not present

## 2020-10-02 DIAGNOSIS — Z299 Encounter for prophylactic measures, unspecified: Secondary | ICD-10-CM | POA: Diagnosis not present

## 2020-10-03 DIAGNOSIS — E2839 Other primary ovarian failure: Secondary | ICD-10-CM | POA: Diagnosis not present

## 2020-10-06 ENCOUNTER — Other Ambulatory Visit: Payer: Self-pay | Admitting: Gastroenterology

## 2020-10-06 DIAGNOSIS — R197 Diarrhea, unspecified: Secondary | ICD-10-CM

## 2020-10-22 DIAGNOSIS — H401131 Primary open-angle glaucoma, bilateral, mild stage: Secondary | ICD-10-CM | POA: Diagnosis not present

## 2020-11-04 DIAGNOSIS — I10 Essential (primary) hypertension: Secondary | ICD-10-CM | POA: Diagnosis not present

## 2020-11-04 DIAGNOSIS — Z299 Encounter for prophylactic measures, unspecified: Secondary | ICD-10-CM | POA: Diagnosis not present

## 2020-11-04 DIAGNOSIS — J9611 Chronic respiratory failure with hypoxia: Secondary | ICD-10-CM | POA: Diagnosis not present

## 2020-11-04 DIAGNOSIS — Z789 Other specified health status: Secondary | ICD-10-CM | POA: Diagnosis not present

## 2020-11-04 DIAGNOSIS — M5020 Other cervical disc displacement, unspecified cervical region: Secondary | ICD-10-CM | POA: Diagnosis not present

## 2020-11-04 DIAGNOSIS — Z6841 Body Mass Index (BMI) 40.0 and over, adult: Secondary | ICD-10-CM | POA: Diagnosis not present

## 2020-11-04 DIAGNOSIS — F419 Anxiety disorder, unspecified: Secondary | ICD-10-CM | POA: Diagnosis not present

## 2020-11-05 DIAGNOSIS — J449 Chronic obstructive pulmonary disease, unspecified: Secondary | ICD-10-CM | POA: Diagnosis not present

## 2020-12-03 DIAGNOSIS — I10 Essential (primary) hypertension: Secondary | ICD-10-CM | POA: Diagnosis not present

## 2020-12-03 DIAGNOSIS — J449 Chronic obstructive pulmonary disease, unspecified: Secondary | ICD-10-CM | POA: Diagnosis not present

## 2020-12-03 DIAGNOSIS — F419 Anxiety disorder, unspecified: Secondary | ICD-10-CM | POA: Diagnosis not present

## 2020-12-03 DIAGNOSIS — M810 Age-related osteoporosis without current pathological fracture: Secondary | ICD-10-CM | POA: Diagnosis not present

## 2020-12-04 DIAGNOSIS — J449 Chronic obstructive pulmonary disease, unspecified: Secondary | ICD-10-CM | POA: Diagnosis not present

## 2020-12-04 DIAGNOSIS — G8929 Other chronic pain: Secondary | ICD-10-CM | POA: Diagnosis not present

## 2020-12-04 DIAGNOSIS — M170 Bilateral primary osteoarthritis of knee: Secondary | ICD-10-CM | POA: Diagnosis not present

## 2020-12-04 DIAGNOSIS — M2392 Unspecified internal derangement of left knee: Secondary | ICD-10-CM | POA: Diagnosis not present

## 2020-12-05 DIAGNOSIS — M545 Low back pain, unspecified: Secondary | ICD-10-CM | POA: Diagnosis not present

## 2020-12-05 DIAGNOSIS — I10 Essential (primary) hypertension: Secondary | ICD-10-CM | POA: Diagnosis not present

## 2020-12-05 DIAGNOSIS — Z299 Encounter for prophylactic measures, unspecified: Secondary | ICD-10-CM | POA: Diagnosis not present

## 2020-12-05 DIAGNOSIS — F1120 Opioid dependence, uncomplicated: Secondary | ICD-10-CM | POA: Diagnosis not present

## 2020-12-05 DIAGNOSIS — J439 Emphysema, unspecified: Secondary | ICD-10-CM | POA: Diagnosis not present

## 2020-12-05 DIAGNOSIS — Z6841 Body Mass Index (BMI) 40.0 and over, adult: Secondary | ICD-10-CM | POA: Diagnosis not present

## 2020-12-09 DIAGNOSIS — G4730 Sleep apnea, unspecified: Secondary | ICD-10-CM | POA: Diagnosis not present

## 2020-12-09 DIAGNOSIS — Z299 Encounter for prophylactic measures, unspecified: Secondary | ICD-10-CM | POA: Diagnosis not present

## 2020-12-09 DIAGNOSIS — B359 Dermatophytosis, unspecified: Secondary | ICD-10-CM | POA: Diagnosis not present

## 2020-12-09 DIAGNOSIS — J069 Acute upper respiratory infection, unspecified: Secondary | ICD-10-CM | POA: Diagnosis not present

## 2020-12-09 DIAGNOSIS — Z6841 Body Mass Index (BMI) 40.0 and over, adult: Secondary | ICD-10-CM | POA: Diagnosis not present

## 2020-12-15 DIAGNOSIS — G8929 Other chronic pain: Secondary | ICD-10-CM | POA: Diagnosis not present

## 2020-12-15 DIAGNOSIS — M25562 Pain in left knee: Secondary | ICD-10-CM | POA: Diagnosis not present

## 2020-12-15 DIAGNOSIS — M23342 Other meniscus derangements, anterior horn of lateral meniscus, left knee: Secondary | ICD-10-CM | POA: Diagnosis not present

## 2020-12-15 DIAGNOSIS — S83272A Complex tear of lateral meniscus, current injury, left knee, initial encounter: Secondary | ICD-10-CM | POA: Diagnosis not present

## 2020-12-15 DIAGNOSIS — M2352 Chronic instability of knee, left knee: Secondary | ICD-10-CM | POA: Diagnosis not present

## 2020-12-15 DIAGNOSIS — M948X6 Other specified disorders of cartilage, lower leg: Secondary | ICD-10-CM | POA: Diagnosis not present

## 2020-12-15 DIAGNOSIS — M23042 Cystic meniscus, anterior horn of lateral meniscus, left knee: Secondary | ICD-10-CM | POA: Diagnosis not present

## 2020-12-17 DIAGNOSIS — M23301 Other meniscus derangements, unspecified lateral meniscus, left knee: Secondary | ICD-10-CM | POA: Diagnosis not present

## 2020-12-17 DIAGNOSIS — M1712 Unilateral primary osteoarthritis, left knee: Secondary | ICD-10-CM | POA: Diagnosis not present

## 2021-01-02 DIAGNOSIS — J449 Chronic obstructive pulmonary disease, unspecified: Secondary | ICD-10-CM | POA: Diagnosis not present

## 2021-01-02 DIAGNOSIS — F419 Anxiety disorder, unspecified: Secondary | ICD-10-CM | POA: Diagnosis not present

## 2021-01-02 DIAGNOSIS — I10 Essential (primary) hypertension: Secondary | ICD-10-CM | POA: Diagnosis not present

## 2021-01-06 DIAGNOSIS — J9611 Chronic respiratory failure with hypoxia: Secondary | ICD-10-CM | POA: Diagnosis not present

## 2021-01-06 DIAGNOSIS — I10 Essential (primary) hypertension: Secondary | ICD-10-CM | POA: Diagnosis not present

## 2021-01-06 DIAGNOSIS — Z6841 Body Mass Index (BMI) 40.0 and over, adult: Secondary | ICD-10-CM | POA: Diagnosis not present

## 2021-01-06 DIAGNOSIS — Z87891 Personal history of nicotine dependence: Secondary | ICD-10-CM | POA: Diagnosis not present

## 2021-01-06 DIAGNOSIS — M2550 Pain in unspecified joint: Secondary | ICD-10-CM | POA: Diagnosis not present

## 2021-01-06 DIAGNOSIS — J439 Emphysema, unspecified: Secondary | ICD-10-CM | POA: Diagnosis not present

## 2021-01-06 DIAGNOSIS — Z299 Encounter for prophylactic measures, unspecified: Secondary | ICD-10-CM | POA: Diagnosis not present

## 2021-01-13 DIAGNOSIS — G4733 Obstructive sleep apnea (adult) (pediatric): Secondary | ICD-10-CM | POA: Diagnosis not present

## 2021-02-06 DIAGNOSIS — F1120 Opioid dependence, uncomplicated: Secondary | ICD-10-CM | POA: Diagnosis not present

## 2021-02-06 DIAGNOSIS — F419 Anxiety disorder, unspecified: Secondary | ICD-10-CM | POA: Diagnosis not present

## 2021-02-06 DIAGNOSIS — M2550 Pain in unspecified joint: Secondary | ICD-10-CM | POA: Diagnosis not present

## 2021-02-06 DIAGNOSIS — I10 Essential (primary) hypertension: Secondary | ICD-10-CM | POA: Diagnosis not present

## 2021-02-06 DIAGNOSIS — J439 Emphysema, unspecified: Secondary | ICD-10-CM | POA: Diagnosis not present

## 2021-02-06 DIAGNOSIS — Z299 Encounter for prophylactic measures, unspecified: Secondary | ICD-10-CM | POA: Diagnosis not present

## 2021-02-26 ENCOUNTER — Other Ambulatory Visit: Payer: Self-pay | Admitting: Gastroenterology

## 2021-02-26 DIAGNOSIS — R197 Diarrhea, unspecified: Secondary | ICD-10-CM

## 2021-03-02 NOTE — Telephone Encounter (Signed)
Noted  

## 2021-03-04 DIAGNOSIS — M170 Bilateral primary osteoarthritis of knee: Secondary | ICD-10-CM | POA: Diagnosis not present

## 2021-03-06 DIAGNOSIS — F419 Anxiety disorder, unspecified: Secondary | ICD-10-CM | POA: Diagnosis not present

## 2021-03-06 DIAGNOSIS — Z6841 Body Mass Index (BMI) 40.0 and over, adult: Secondary | ICD-10-CM | POA: Diagnosis not present

## 2021-03-06 DIAGNOSIS — M5020 Other cervical disc displacement, unspecified cervical region: Secondary | ICD-10-CM | POA: Diagnosis not present

## 2021-03-06 DIAGNOSIS — M545 Low back pain, unspecified: Secondary | ICD-10-CM | POA: Diagnosis not present

## 2021-03-06 DIAGNOSIS — J439 Emphysema, unspecified: Secondary | ICD-10-CM | POA: Diagnosis not present

## 2021-03-06 DIAGNOSIS — I10 Essential (primary) hypertension: Secondary | ICD-10-CM | POA: Diagnosis not present

## 2021-03-06 DIAGNOSIS — Z299 Encounter for prophylactic measures, unspecified: Secondary | ICD-10-CM | POA: Diagnosis not present

## 2021-03-18 ENCOUNTER — Other Ambulatory Visit: Payer: Self-pay | Admitting: Internal Medicine

## 2021-03-18 ENCOUNTER — Other Ambulatory Visit: Payer: Self-pay

## 2021-03-18 DIAGNOSIS — Z1231 Encounter for screening mammogram for malignant neoplasm of breast: Secondary | ICD-10-CM

## 2021-04-06 DIAGNOSIS — Z789 Other specified health status: Secondary | ICD-10-CM | POA: Diagnosis not present

## 2021-04-06 DIAGNOSIS — J45909 Unspecified asthma, uncomplicated: Secondary | ICD-10-CM | POA: Diagnosis not present

## 2021-04-06 DIAGNOSIS — F419 Anxiety disorder, unspecified: Secondary | ICD-10-CM | POA: Diagnosis not present

## 2021-04-06 DIAGNOSIS — Z6841 Body Mass Index (BMI) 40.0 and over, adult: Secondary | ICD-10-CM | POA: Diagnosis not present

## 2021-04-06 DIAGNOSIS — I10 Essential (primary) hypertension: Secondary | ICD-10-CM | POA: Diagnosis not present

## 2021-04-06 DIAGNOSIS — J439 Emphysema, unspecified: Secondary | ICD-10-CM | POA: Diagnosis not present

## 2021-04-06 DIAGNOSIS — Z299 Encounter for prophylactic measures, unspecified: Secondary | ICD-10-CM | POA: Diagnosis not present

## 2021-04-06 DIAGNOSIS — M5020 Other cervical disc displacement, unspecified cervical region: Secondary | ICD-10-CM | POA: Diagnosis not present

## 2021-04-15 ENCOUNTER — Ambulatory Visit
Admission: RE | Admit: 2021-04-15 | Discharge: 2021-04-15 | Disposition: A | Discharge status: Home / Self Care | Payer: PPO | Source: Ambulatory Visit | Attending: Internal Medicine | Admitting: Internal Medicine

## 2021-04-15 DIAGNOSIS — Z1231 Encounter for screening mammogram for malignant neoplasm of breast: Secondary | ICD-10-CM | POA: Diagnosis not present

## 2021-05-06 ENCOUNTER — Ambulatory Visit: Payer: PPO | Admitting: Gastroenterology

## 2021-05-06 ENCOUNTER — Encounter: Payer: Self-pay | Admitting: Gastroenterology

## 2021-05-06 VITALS — BP 110/72 | HR 69 | Temp 97.3°F | Ht 61.5 in | Wt 221.4 lb

## 2021-05-06 DIAGNOSIS — M2550 Pain in unspecified joint: Secondary | ICD-10-CM | POA: Diagnosis not present

## 2021-05-06 DIAGNOSIS — Z299 Encounter for prophylactic measures, unspecified: Secondary | ICD-10-CM | POA: Diagnosis not present

## 2021-05-06 DIAGNOSIS — J439 Emphysema, unspecified: Secondary | ICD-10-CM | POA: Diagnosis not present

## 2021-05-06 DIAGNOSIS — I10 Essential (primary) hypertension: Secondary | ICD-10-CM | POA: Diagnosis not present

## 2021-05-06 DIAGNOSIS — Z6841 Body Mass Index (BMI) 40.0 and over, adult: Secondary | ICD-10-CM | POA: Diagnosis not present

## 2021-05-06 DIAGNOSIS — R197 Diarrhea, unspecified: Secondary | ICD-10-CM | POA: Diagnosis not present

## 2021-05-06 DIAGNOSIS — J441 Chronic obstructive pulmonary disease with (acute) exacerbation: Secondary | ICD-10-CM | POA: Diagnosis not present

## 2021-05-06 DIAGNOSIS — R143 Flatulence: Secondary | ICD-10-CM | POA: Diagnosis not present

## 2021-05-06 DIAGNOSIS — G4733 Obstructive sleep apnea (adult) (pediatric): Secondary | ICD-10-CM | POA: Diagnosis not present

## 2021-05-06 MED ORDER — COLESTIPOL HCL 1 G PO TABS: 1.0000 g | ORAL_TABLET | Freq: Two times a day (BID) | ORAL | 5 refills | Status: DC | PRN

## 2021-05-06 NOTE — Patient Instructions (Addendum)
Add a probiotic daily for 1-2 months to see if this helps with gas/diarrhea. You can come off after 1-2 months but if you notice a recurrence of symptoms then you may need to cycle again. ?Add Colestid 1 gram once to twice daily for diarrhea. Do not take within 2 hours of other medications. Let me know if we need to adjust dosage. IF YOUR BOWELS ARE BETTER CONTROLLED YOU CAN DECREASE DICYCLOMINE DOSAGE. ?Let me know if you continue to have a lot of foul gas after probiotics and current antibiotics. If persistent, we could try Xifaxan for small bowel bacterial overgrowth. This would be a short term treatment option. ?Colonoscopy in the near future. See separate instructions.  ?

## 2021-05-06 NOTE — Progress Notes (Signed)
? ? ? ?GI Office Note   ? ?Referring Provider: Kirstie PeriShah, Ashish, MD ?Primary Care Physician:  Kirstie PeriShah, Ashish, MD  ?Primary Gastroenterologist: Roetta SessionsMichael Rourk, MD ? ? ?Chief Complaint  ? ?Chief Complaint  ?Patient presents with  ? Diarrhea  ?  Continues to have gas all the time as well as diarrhea.  ? ? ?History of Present Illness  ? ?Jenna Rivera is a 61 y.o. female presenting today for diarrhea.  Patient last seen May 2022.  Chronic intermittent diarrhea since cholecystectomy in the 90s.  Previously well controlled with Bentyl.  Prior trial of Questran or Colestid with PCP resulting in constipation.  TTG IgA neg. EGD August 2021 with normal esophagus s/p empiric dilation, numerous gastric erosions in the antrum and gastric body with biopsies negative for H. pylori, bulbar erosions.  Colonoscopy in 2014, by Dr. Teena DunkBenson, sigmoid diverticulosis. ? ?History of elevated IgA of 736 in 2021, unclear significance. Stools are [place] 5-7. Some days ok. Other days 4-5 times per day. Bad days, 5-10 stools per day. Takes up to 3 imodium these days. Bentyl 4-5 times every day. Does not seem to help anymore. Rare nocturnal stools. Rarely feels "constipation", described as stools hard to get going but stools soft. She has a lot of gas. Smells horrible. Worse since on low-carb diet. States she has had intentional weight loss of 60 pounds since 03/2019, total weight loss of nearly 100 pounds since her highest weight of 315 pounds.   ?  ?Recently started antibiotics and steroids for sinusitis. ? ?Medications  ? ?Current Outpatient Medications  ?Medication Sig Dispense Refill  ? acetaminophen (TYLENOL) 500 MG tablet Take 500-1,000 mg by mouth every 6 (six) hours as needed (for pain.).    ? albuterol (PROVENTIL) (2.5 MG/3ML) 0.083% nebulizer solution Inhale 3 mLs into the lungs every 6 (six) hours as needed (wheezing/shortness of breath.).     ? albuterol (VENTOLIN HFA) 108 (90 Base) MCG/ACT inhaler Inhale 2 puffs into the lungs every 6  (six) hours as needed for wheezing or shortness of breath.    ? Ascorbic Acid (VITAMIN C) 1000 MG tablet Take 1,000 mg by mouth daily.    ? budesonide-formoterol (SYMBICORT) 160-4.5 MCG/ACT inhaler Inhale 2 puffs into the lungs 2 (two) times daily.    ? calcium carbonate (OSCAL) 1500 (600 Ca) MG TABS tablet Take 600 mg by mouth daily.    ? cholecalciferol (VITAMIN D) 25 MCG (1000 UNIT) tablet Take 1,000 Units by mouth daily.    ? dicyclomine (BENTYL) 10 MG capsule TAKE 1 CAPSULE BY MOUTH FOUR TIMES DAILY - BEFORE MEALS AND AT BEDTIME 120 capsule 3  ? fluticasone (FLONASE) 50 MCG/ACT nasal spray Place 2 sprays into both nostrils daily at 12 noon. As needed    ? furosemide (LASIX) 40 MG tablet Take 20-40 mg by mouth daily as needed (fluid retention.).     ? gabapentin (NEURONTIN) 300 MG capsule Take 300 mg by mouth 2 (two) times daily.    ? HYDROcodone-acetaminophen (NORCO) 7.5-325 MG tablet Take 1 tablet by mouth in the morning, at noon, in the evening, and at bedtime.    ? ipratropium-albuterol (DUONEB) 0.5-2.5 (3) MG/3ML SOLN Take 3 mLs by nebulization every 4 (four) hours as needed (wheezing/shortness of breath.).     ? latanoprost (XALATAN) 0.005 % ophthalmic solution Place 1 drop into both eyes at bedtime.     ? lisinopril (ZESTRIL) 5 MG tablet Take 5 mg by mouth daily.    ? LORazepam (ATIVAN)  1 MG tablet Take 1 mg by mouth 2 (two) times daily as needed for anxiety.     ? meloxicam (MOBIC) 7.5 MG tablet Take 15 mg by mouth daily.    ? montelukast (SINGULAIR) 10 MG tablet Take 10 mg by mouth at bedtime.    ? Multiple Vitamin (MULTIVITAMIN WITH MINERALS) TABS tablet Take 1 tablet by mouth daily.    ? pantoprazole (PROTONIX) 40 MG tablet Take 40 mg by mouth daily.    ? tiZANidine (ZANAFLEX) 4 MG tablet Take 4 mg by mouth every 6 (six) hours as needed for muscle spasms.    ? ?No current facility-administered medications for this visit.  ? ? ?Allergies  ? ?Allergies as of 05/06/2021  ? (No Known Allergies)  ? ?   ?Past Medical History  ? ?Past Medical History:  ?Diagnosis Date  ? Acute bronchitis with COPD (HCC)   ? Allergic rhinitis   ? Anxiety   ? Asthma   ? Cervical disc herniation   ? Chest pain   ? Chronic pain   ? COPD (chronic obstructive pulmonary disease) (HCC)   ? Edema   ? HTN (hypertension)   ? Hyperlipidemia   ? Lumbago   ? Neck pain   ? Osteoporosis   ? Reflux esophagitis   ? Rosacea   ? Sleep apnea   ? uses CPAP  ? ? ?Past Surgical History  ? ?Past Surgical History:  ?Procedure Laterality Date  ? ABDOMINAL HYSTERECTOMY    ? CHOLECYSTECTOMY    ? 1990s  ? COLONOSCOPY  12/05/2012  ? [facility]; Dr. Teena Dunk; sigmoid diverticulosis, otherwise normal exam.  Recommended repeat in 10 years.  ? ESOPHAGOGASTRODUODENOSCOPY (EGD) WITH PROPOFOL N/A 08/13/2019  ? Procedure: ESOPHAGOGASTRODUODENOSCOPY (EGD) WITH PROPOFOL;  Surgeon: Corbin Ade, MD;  Normal examined esophagus s/p dilations, numerous gastric erosions in the antrum and gastric body s/p biopsy, patent pylorus, bulbar erosions, otherwise D1 and D2 appeared normal. Pathology with gastric antral and oxyntic mucosa, negative for H. pylori.   ? MALONEY DILATION N/A 08/13/2019  ? Procedure: MALONEY DILATION;  Surgeon: Corbin Ade, MD;  Location: [facility];  Service: Endoscopy;  Laterality: N/A;  ? MOUTH SURGERY    ? TUBAL LIGATION    ? ? ?Past Family History  ? ?Family History  ?Problem Relation Age of Onset  ? COPD Mother   ? Cancer Father   ? Breast cancer Sister   ? Lung cancer Sister   ? Multiple sclerosis Sister   ? Colon cancer Neg Hx   ?     family history is limited.   ? ? ?Past Social History  ? ?Social History  ? ?Socioeconomic History  ? Marital status: Married  ?  Spouse name: Not on file  ? Number of children: Not on file  ? Years of education: Not on file  ? Highest education level: Not on file  ?Occupational History  ? Not on file  ?Tobacco Use  ? Smoking status: Former  ?  Packs/day: 1.00  ?  Years: 15.00  ?  Pack years: 15.00  ?  Types:  Cigarettes  ?  Quit date: 01/05/1991  ?  Years since quitting: 30.3  ? Smokeless tobacco: Never  ?Vaping Use  ? Vaping Use: Never used  ?Substance and Sexual Activity  ? Alcohol use: Yes  ?  Comment: occasional  ? Drug use: Never  ? Sexual activity: Not Currently  ?Other Topics Concern  ? Not on file  ?Social  History Narrative  ? Not on file  ? ?Social Determinants of Health  ? ?Financial Resource Strain: Not on file  ?Food Insecurity: Not on file  ?Transportation Needs: Not on file  ?Physical Activity: Not on file  ?Stress: Not on file  ?Social Connections: Not on file  ?Intimate Partner Violence: Not on file  ? ? ?Review of Systems  ? ?General: Negative for anorexia, unintentional weight loss, fever, chills, fatigue, weakness. ?ENT: Negative for hoarseness, difficulty swallowing , nasal congestion. ?CV: Negative for chest pain, angina, palpitations, dyspnea on exertion, peripheral edema.  ?Respiratory: Negative for dyspnea at rest, dyspnea on exertion, cough, sputum, wheezing.  ?GI: See history of present illness. No heartburn, dysphagia, vomiting ?GU:  Negative for dysuria, hematuria, urinary incontinence, urinary frequency, nocturnal urination.  ?Endo: Negative for unusual weight change.  ?   ?Physical Exam  ? ?BP 110/72 (BP Location: Right Arm, Patient Position: Sitting, Cuff Size: Large)   Pulse 69   Temp (!) 97.3 ?F (36.3 ?C) (Temporal)   Ht 5' 1.5" (1.562 m)   Wt 221 lb 6.4 oz (100.4 kg)   SpO2 97%   BMI 41.16 kg/m?  ?  ?General: Well-nourished, well-developed in no acute distress.  ?Eyes: No icterus. ?Mouth: Oropharyngeal mucosa moist and pink , no lesions erythema or exudate. ?Lungs: Clear to auscultation bilaterally.  ?Heart: Regular rate and rhythm, no murmurs rubs or gallops.  ?Abdomen: Bowel sounds are normal, nontender, nondistended, no hepatosplenomegaly or masses,  ?no abdominal bruits or hernia , no rebound or guarding.  ?Rectal: not performed ?Extremities: No lower extremity edema. No clubbing  or deformities. ?Neuro: Alert and oriented x 4   ?Skin: Warm and dry, no jaundice.   ?Psych: Alert and cooperative, normal mood and affect. ? ?Labs  ? ?No recent labs ? ?Imaging Studies  ? ?MM 3D SCREEN BREAST BILATERAL

## 2021-05-12 ENCOUNTER — Telehealth: Payer: Self-pay | Admitting: *Deleted

## 2021-05-12 ENCOUNTER — Encounter: Payer: Self-pay | Admitting: *Deleted

## 2021-05-12 MED ORDER — PEG 3350-KCL-NA BICARB-NACL 420 G PO SOLR: ORAL | 0 refills | Status: DC

## 2021-05-12 NOTE — Telephone Encounter (Signed)
Called pt. She has been scheduled for TCS with random colon bx asa 3 on 6/15 at 2:45pm. Aware will mail prep instructions with pre-op appt and send prep rx to pharmacy.  ?

## 2021-05-18 ENCOUNTER — Telehealth: Payer: Self-pay | Admitting: Gastroenterology

## 2021-05-18 DIAGNOSIS — R899 Unspecified abnormal finding in specimens from other organs, systems and tissues: Secondary | ICD-10-CM

## 2021-05-18 DIAGNOSIS — R768 Other specified abnormal immunological findings in serum: Secondary | ICD-10-CM

## 2021-05-18 NOTE — Telephone Encounter (Signed)
Can we get IgG/IgM/IgA at time of pre-op labs? Dx: elevated IgA ?

## 2021-05-19 NOTE — Addendum Note (Signed)
Addended by: Armstead Peaks on: 05/19/2021 09:32 AM ? ? Modules accepted: Orders ? ?

## 2021-05-19 NOTE — Telephone Encounter (Signed)
Noted  

## 2021-05-19 NOTE — Telephone Encounter (Signed)
Lab ordered. Notes added to encounter ?

## 2021-06-04 DIAGNOSIS — Z299 Encounter for prophylactic measures, unspecified: Secondary | ICD-10-CM | POA: Diagnosis not present

## 2021-06-04 DIAGNOSIS — I10 Essential (primary) hypertension: Secondary | ICD-10-CM | POA: Diagnosis not present

## 2021-06-04 DIAGNOSIS — M545 Low back pain, unspecified: Secondary | ICD-10-CM | POA: Diagnosis not present

## 2021-06-04 DIAGNOSIS — Z789 Other specified health status: Secondary | ICD-10-CM | POA: Diagnosis not present

## 2021-06-04 DIAGNOSIS — J441 Chronic obstructive pulmonary disease with (acute) exacerbation: Secondary | ICD-10-CM | POA: Diagnosis not present

## 2021-06-05 DIAGNOSIS — M19011 Primary osteoarthritis, right shoulder: Secondary | ICD-10-CM | POA: Diagnosis not present

## 2021-06-05 DIAGNOSIS — M170 Bilateral primary osteoarthritis of knee: Secondary | ICD-10-CM | POA: Diagnosis not present

## 2021-06-05 DIAGNOSIS — M75101 Unspecified rotator cuff tear or rupture of right shoulder, not specified as traumatic: Secondary | ICD-10-CM | POA: Diagnosis not present

## 2021-06-11 NOTE — Patient Instructions (Addendum)
Jenna Rivera  06/11/2021     @PREFPERIOPPHARMACY @   Your procedure is scheduled on  06/18/2021.   Report to 06/20/2021 at  1300 (1:00) P.M.   Call this number if you have problems the morning of surgery:  [phone]   Remember:  Follow the diet and prep instructions given to you by the office.    Use your nebulizer and your inhaler if you need them before you come.    Take these medicines the morning of surgery with A SIP OF WATER           gabapentin, hydrocodone(If needed), ativan (if needed), mobic (if needed)    Do not wear jewelry, make-up or nail polish.  Do not wear lotions, powders, or perfumes, or deodorant.  Do not shave 48 hours prior to surgery.  Men may shave face and neck.  Do not bring valuables to the hospital.  [facility] is not responsible for any belongings or valuables.  Contacts, dentures or bridgework may not be worn into surgery.  Leave your suitcase in the car.  After surgery it may be brought to your room.  For patients admitted to the hospital, discharge time will be determined by your treatment team.  Patients discharged the day of surgery will not be allowed to drive home and must have someone with them for 24 hours.    Special instructions:   DO NOT smoke tobacco or vape for 24 hours before your procedure.  Please read over the following fact sheets that you were given. Anesthesia Post-op Instructions and Care and Recovery After Surgery       Colonoscopy, Adult, Care After The following information offers guidance on how to care for yourself after your procedure. Your health care provider may also give you more specific instructions. If you have problems or questions, contact your health care provider. What can I expect after the procedure? After the procedure, it is common to have: A small amount of blood in your stool for 24 hours after the procedure. Some gas. Mild cramping or bloating of your abdomen. Follow these  instructions at home: Eating and drinking  Drink enough fluid to keep your urine pale yellow. Follow instructions from your health care provider about eating or drinking restrictions. Resume your normal diet as told by your health care provider. Avoid heavy or fried foods that are hard to digest. Activity Rest as told by your health care provider. Avoid sitting for a long time without moving. Get up to take short walks every 1-2 hours. This is important to improve blood flow and breathing. Ask for help if you feel weak or unsteady. Return to your normal activities as told by your health care provider. Ask your health care provider what activities are safe for you. Managing cramping and bloating  Try walking around when you have cramps or feel bloated. If directed, apply heat to your abdomen as told by your health care provider. Use the heat source that your health care provider recommends, such as a moist heat pack or a heating pad. Place a towel between your skin and the heat source. Leave the heat on for 20-30 minutes. Remove the heat if your skin turns bright red. This is especially important if you are unable to feel pain, heat, or cold. You have a greater risk of getting burned. General instructions If you were given a sedative during the procedure, it can affect you for several hours. Do not  drive or operate machinery until your health care provider says that it is safe. For the first 24 hours after the procedure: Do not sign important documents. Do not drink alcohol. Do your regular daily activities at a slower pace than normal. Eat soft foods that are easy to digest. Take over-the-counter and prescription medicines only as told by your health care provider. Keep all follow-up visits. This is important. Contact a health care provider if: You have blood in your stool 2-3 days after the procedure. Get help right away if: You have more than a small spotting of blood in your  stool. You have large blood clots in your stool. You have swelling of your abdomen. You have nausea or vomiting. You have a fever. You have increasing pain in your abdomen that is not relieved with medicine. These symptoms may be an emergency. Get help right away. Call 911. Do not wait to see if the symptoms will go away. Do not drive yourself to the hospital. Summary After the procedure, it is common to have a small amount of blood in your stool. You may also have mild cramping and bloating of your abdomen. If you were given a sedative during the procedure, it can affect you for several hours. Do not drive or operate machinery until your health care provider says that it is safe. Get help right away if you have a lot of blood in your stool, nausea or vomiting, a fever, or increased pain in your abdomen. This information is not intended to replace advice given to you by your health care provider. Make sure you discuss any questions you have with your health care provider. Document Revised: 08/13/2020 Document Reviewed: 08/13/2020 Elsevier Patient Education  [place] After This sheet gives you information about how to care for yourself after your procedure. Your health care provider may also give you more specific instructions. If you have problems or questions, contact your health care provider. What can I expect after the procedure? After the procedure, it is common to have: Tiredness. Forgetfulness about what happened after the procedure. Impaired judgment for important decisions. Nausea or vomiting. Some difficulty with balance. Follow these instructions at home: For the time period you were told by your health care provider:     Rest as needed. Do not participate in activities where you could fall or become injured. Do not drive or use machinery. Do not drink alcohol. Do not take sleeping pills or medicines that cause drowsiness. Do  not make important decisions or sign legal documents. Do not take care of children on your own. Eating and drinking Follow the diet that is recommended by your health care provider. Drink enough fluid to keep your urine pale yellow. If you vomit: Drink water, juice, or soup when you can drink without vomiting. Make sure you have little or no nausea before eating solid foods. General instructions Have a responsible adult stay with you for the time you are told. It is important to have someone help care for you until you are awake and alert. Take over-the-counter and prescription medicines only as told by your health care provider. If you have sleep apnea, surgery and certain medicines can increase your risk for breathing problems. Follow instructions from your health care provider about wearing your sleep device: Anytime you are sleeping, including during daytime naps. While taking prescription pain medicines, sleeping medicines, or medicines that make you drowsy. Avoid smoking. Keep all follow-up visits as told by  your health care provider. This is important. Contact a health care provider if: You keep feeling nauseous or you keep vomiting. You feel light-headed. You are still sleepy or having trouble with balance after 24 hours. You develop a rash. You have a fever. You have redness or swelling around the IV site. Get help right away if: You have trouble breathing. You have new-onset confusion at home. Summary For several hours after your procedure, you may feel tired. You may also be forgetful and have poor judgment. Have a responsible adult stay with you for the time you are told. It is important to have someone help care for you until you are awake and alert. Rest as told. Do not drive or operate machinery. Do not drink alcohol or take sleeping pills. Get help right away if you have trouble breathing, or if you suddenly become confused. This information is not intended to replace  advice given to you by your health care provider. Make sure you discuss any questions you have with your health care provider. Document Revised: 11/25/2020 Document Reviewed: 11/23/2018 Elsevier Patient Education  [place].

## 2021-06-15 ENCOUNTER — Encounter (HOSPITAL_COMMUNITY)
Admission: RE | Admit: 2021-06-15 | Discharge: 2021-06-15 | Disposition: A | Discharge status: Home / Self Care | Payer: PPO | Source: Ambulatory Visit | Attending: Internal Medicine | Admitting: Internal Medicine

## 2021-06-15 VITALS — BP 131/45 | HR 61 | Temp 97.7°F | Resp 18 | Ht 61.5 in | Wt 215.0 lb

## 2021-06-15 DIAGNOSIS — G4733 Obstructive sleep apnea (adult) (pediatric): Secondary | ICD-10-CM | POA: Diagnosis not present

## 2021-06-15 DIAGNOSIS — Z01818 Encounter for other preprocedural examination: Secondary | ICD-10-CM | POA: Diagnosis not present

## 2021-06-15 DIAGNOSIS — Z9989 Dependence on other enabling machines and devices: Secondary | ICD-10-CM | POA: Insufficient documentation

## 2021-06-15 DIAGNOSIS — K219 Gastro-esophageal reflux disease without esophagitis: Secondary | ICD-10-CM | POA: Diagnosis not present

## 2021-06-16 LAB — IGG, IGA, IGM
IgA: 457 mg/dL — ABNORMAL HIGH (ref 87–352)
IgG (Immunoglobin G), Serum: 389 mg/dL — ABNORMAL LOW (ref 586–1602)
IgM (Immunoglobulin M), Srm: 39 mg/dL (ref 26–217)

## 2021-06-18 ENCOUNTER — Ambulatory Visit (HOSPITAL_BASED_OUTPATIENT_CLINIC_OR_DEPARTMENT_OTHER): Payer: PPO | Admitting: Anesthesiology

## 2021-06-18 ENCOUNTER — Encounter (HOSPITAL_COMMUNITY)
Admission: RE | Disposition: A | Discharge status: Home / Self Care | Payer: Self-pay | Source: Home / Self Care | Attending: Internal Medicine

## 2021-06-18 ENCOUNTER — Encounter (HOSPITAL_COMMUNITY): Payer: Self-pay | Admitting: Internal Medicine

## 2021-06-18 ENCOUNTER — Ambulatory Visit (HOSPITAL_COMMUNITY)
Admission: RE | Admit: 2021-06-18 | Discharge: 2021-06-18 | Disposition: A | Discharge status: Home / Self Care | Payer: PPO | Attending: Internal Medicine | Admitting: Internal Medicine

## 2021-06-18 ENCOUNTER — Ambulatory Visit (HOSPITAL_COMMUNITY): Payer: PPO | Admitting: Anesthesiology

## 2021-06-18 DIAGNOSIS — G4730 Sleep apnea, unspecified: Secondary | ICD-10-CM | POA: Insufficient documentation

## 2021-06-18 DIAGNOSIS — F419 Anxiety disorder, unspecified: Secondary | ICD-10-CM | POA: Diagnosis not present

## 2021-06-18 DIAGNOSIS — G4733 Obstructive sleep apnea (adult) (pediatric): Secondary | ICD-10-CM

## 2021-06-18 DIAGNOSIS — K5730 Diverticulosis of large intestine without perforation or abscess without bleeding: Secondary | ICD-10-CM | POA: Insufficient documentation

## 2021-06-18 DIAGNOSIS — K529 Noninfective gastroenteritis and colitis, unspecified: Secondary | ICD-10-CM | POA: Diagnosis not present

## 2021-06-18 DIAGNOSIS — K219 Gastro-esophageal reflux disease without esophagitis: Secondary | ICD-10-CM | POA: Diagnosis not present

## 2021-06-18 DIAGNOSIS — Z87891 Personal history of nicotine dependence: Secondary | ICD-10-CM | POA: Insufficient documentation

## 2021-06-18 DIAGNOSIS — K649 Unspecified hemorrhoids: Secondary | ICD-10-CM

## 2021-06-18 DIAGNOSIS — I10 Essential (primary) hypertension: Secondary | ICD-10-CM | POA: Diagnosis not present

## 2021-06-18 DIAGNOSIS — K640 First degree hemorrhoids: Secondary | ICD-10-CM | POA: Diagnosis not present

## 2021-06-18 DIAGNOSIS — R197 Diarrhea, unspecified: Secondary | ICD-10-CM | POA: Diagnosis not present

## 2021-06-18 DIAGNOSIS — K635 Polyp of colon: Secondary | ICD-10-CM | POA: Diagnosis not present

## 2021-06-18 DIAGNOSIS — J449 Chronic obstructive pulmonary disease, unspecified: Secondary | ICD-10-CM | POA: Insufficient documentation

## 2021-06-18 DIAGNOSIS — Z836 Family history of other diseases of the respiratory system: Secondary | ICD-10-CM | POA: Diagnosis not present

## 2021-06-18 HISTORY — PX: COLONOSCOPY WITH PROPOFOL: SHX5780

## 2021-06-18 HISTORY — PX: BIOPSY: SHX5522

## 2021-06-18 SURGERY — COLONOSCOPY WITH PROPOFOL
Anesthesia: General

## 2021-06-18 MED ORDER — LACTATED RINGERS IV SOLN: INTRAVENOUS | Status: DC | PRN

## 2021-06-18 MED ORDER — LIDOCAINE HCL 1 % IJ SOLN
INTRAMUSCULAR | Status: DC | PRN
  Administered 2021-06-18: 50 mg via INTRADERMAL

## 2021-06-18 MED ORDER — PROPOFOL 10 MG/ML IV BOLUS
INTRAVENOUS | Status: DC | PRN
  Administered 2021-06-18 (×5): 50 mg via INTRAVENOUS

## 2021-06-18 NOTE — H&P (Signed)
@LOGO @   Primary Care Physician:  , MD Primary Gastroenterologist:  Dr. Kirstie Peri  Pre-Procedure History & Physical: HPI:  Jenna Rivera is a 61 y.o. female here for diagnostic colonoscopy.  Chronic nonbloody diarrhea.  Last colonoscopy revealed only diverticulosis almost 10 years ago elsewhere.  Past Medical History:  Diagnosis Date   Acute bronchitis with COPD (HCC)    Allergic rhinitis    Anxiety    Asthma    Cervical disc herniation    Chest pain    Chronic pain    COPD (chronic obstructive pulmonary disease) (HCC)    Edema    HTN (hypertension)    Hyperlipidemia    Lumbago    Neck pain    Osteoporosis    Reflux esophagitis    Rosacea    Sleep apnea    uses CPAP    Past Surgical History:  Procedure Laterality Date   ABDOMINAL HYSTERECTOMY     CHOLECYSTECTOMY     1990s   COLONOSCOPY  12/05/2012   [place]; Dr. 14/02/2012; sigmoid diverticulosis, otherwise normal exam.  Recommended repeat in 10 years.   ESOPHAGOGASTRODUODENOSCOPY (EGD) WITH PROPOFOL N/A 08/13/2019   Procedure: ESOPHAGOGASTRODUODENOSCOPY (EGD) WITH PROPOFOL;  Surgeon: 10/13/2019, MD;  Normal examined esophagus s/p dilations, numerous gastric erosions in the antrum and gastric body s/p biopsy, patent pylorus, bulbar erosions, otherwise D1 and D2 appeared normal. Pathology with gastric antral and oxyntic mucosa, negative for H. pylori.    MALONEY DILATION N/A 08/13/2019   Procedure: 10/13/2019 DILATION;  Surgeon: Elease Hashimoto, MD;  Location: [facility];  Service: Endoscopy;  Laterality: N/A;   MOUTH SURGERY     TUBAL LIGATION      Prior to Admission medications   Medication Sig Start Date End Date Taking? Authorizing Provider  acetaminophen (TYLENOL) 500 MG tablet Take 500-1,000 mg by mouth every 6 (six) hours as needed (for pain.).   Yes [provider]  albuterol (PROVENTIL) (2.5 MG/3ML) 0.083% nebulizer solution Inhale 3 mLs into the lungs every 6 (six) hours as needed  (wheezing/shortness of breath.).  11/19/12  Yes [provider]  albuterol (VENTOLIN HFA) 108 (90 Base) MCG/ACT inhaler Inhale 2 puffs into the lungs every 6 (six) hours as needed for wheezing or shortness of breath.   Yes [provider]  Ascorbic Acid (VITAMIN C) 1000 MG tablet Take 1,000 mg by mouth in the morning.   Yes [provider]  Budeson-Glycopyrrol-Formoterol (BREZTRI AEROSPHERE) 160-9-4.8 MCG/ACT AERO Inhale 1 puff into the lungs in the morning and at bedtime.   Yes [provider]  calcium carbonate (TUMS - DOSED IN MG ELEMENTAL CALCIUM) 500 MG chewable tablet Chew 1 tablet by mouth 3 (three) times a week.   Yes [provider]  Cholecalciferol (VITAMIN D3) 50 MCG (2000 UT) TABS Take 2,000 Units by mouth in the morning.   Yes [provider]  colestipol (COLESTID) 1 g tablet Take 1 tablet (1 g total) by mouth 2 (two) times daily as needed (diarrhea). Do not take within two hours of other medications. 05/06/21  Yes 07/06/21, PA-C  dicyclomine (BENTYL) 10 MG capsule TAKE 1 CAPSULE BY MOUTH FOUR TIMES DAILY - BEFORE MEALS AND AT BEDTIME 03/02/21  Yes Harper, Kristen S, PA-C  fluticasone (FLONASE) 50 MCG/ACT nasal spray Place 2 sprays into both nostrils daily as needed for allergies.   Yes [provider]  furosemide (LASIX) 40 MG tablet Take 20-40 mg by mouth daily as needed (  fluid retention.).    Yes [provider]  gabapentin (NEURONTIN) 300 MG capsule Take 300-600 mg by mouth See admin instructions. Take 1 capsule (300 mg) by mouth in the morning & take 2 capsules (600 mg) by mouth at night   Yes [provider]  HYDROcodone-acetaminophen (NORCO) 7.5-325 MG tablet Take 1 tablet by mouth every 6 (six) hours as needed (pain.).   Yes [provider]  ipratropium-albuterol (DUONEB) 0.5-2.5 (3) MG/3ML SOLN Take 3 mLs by nebulization every 4 (four) hours as needed (wheezing/shortness of breath.).    Yes  [provider]  latanoprost (XALATAN) 0.005 % ophthalmic solution Place 1 drop into both eyes at bedtime.  06/21/19  Yes [provider]  lisinopril (ZESTRIL) 5 MG tablet Take 5 mg by mouth in the morning.   Yes [provider]  LORazepam (ATIVAN) 1 MG tablet Take 1 mg by mouth 2 (two) times daily as needed for anxiety.  12/13/12  Yes [provider]  meloxicam (MOBIC) 15 MG tablet Take 15 mg by mouth daily. 12/06/20  Yes [provider]  montelukast (SINGULAIR) 10 MG tablet Take 10 mg by mouth at bedtime.   Yes [provider]  Multiple Vitamin (MULTIVITAMIN WITH MINERALS) TABS tablet Take 1 tablet by mouth in the morning.   Yes [provider]  pantoprazole (PROTONIX) 40 MG tablet Take 40 mg by mouth daily before breakfast.   Yes [provider]  polyethylene glycol-electrolytes (NULYTELY) 420 g solution As directed 05/12/21  Yes Elliott Lasecki, Gerrit Friends, MD  tiZANidine (ZANAFLEX) 4 MG tablet Take 4 mg by mouth every 8 (eight) hours as needed for muscle spasms.   Yes [provider]    Allergies as of 05/12/2021   (No Known Allergies)    Family History  Problem Relation Age of Onset   COPD Mother    Cancer Father    Breast cancer Sister    Lung cancer Sister    Multiple sclerosis Sister    Colon cancer Neg Hx        family history is limited.     Social History   Socioeconomic History   Marital status: Married    Spouse name: Not on file   Number of children: Not on file   Years of education: Not on file   Highest education level: Not on file  Occupational History   Not on file  Tobacco Use   Smoking status: Former    Packs/day: 1.00    Years: 15.00    Total pack years: 15.00    Types: Cigarettes    Quit date: 01/05/1991    Years since quitting: 30.4   Smokeless tobacco: Never  Vaping Use   Vaping Use: Never used  Substance and Sexual Activity   Alcohol use: Yes    Comment: occasional   Drug use:  Never   Sexual activity: Not Currently  Other Topics Concern   Not on file  Social History Narrative   Not on file   Social Determinants of Health   Financial Resource Strain: Not on file  Food Insecurity: Not on file  Transportation Needs: Not on file  Physical Activity: Not on file  Stress: Not on file  Social Connections: Not on file  Intimate Partner Violence: Not on file    Review of Systems: See HPI, otherwise negative ROS  Physical Exam: BP (!) 157/42   Temp 98.8 F (37.1 C) (Oral)   Resp 17   SpO2 100%  General:   Alert,  Well-developed, well-nourished, pleasant and cooperative in NAD Mouth:  No deformity or lesions. Neck:  Supple; no masses or thyromegaly. No significant cervical adenopathy. Lungs:  Clear throughout to auscultation.   No wheezes, crackles, or rhonchi. No acute distress. Heart:  Regular rate and rhythm; no murmurs, clicks, rubs,  or gallops. Abdomen: Non-distended, normal bowel sounds.  Soft and nontender without appreciable mass or hepatosplenomegaly.  Pulses:  Normal pulses noted. Extremities:  Without clubbing or edema.  Impression/Plan: 61 year old lady here for further evaluation of chronic diarrhea via ileocolonoscopy. The risks, benefits, limitations, alternatives and imponderables have been reviewed with the patient. Questions have been answered. All parties are agreeable.       Notice: This dictation was prepared with Dragon dictation along with smaller phrase technology. Any transcriptional errors that result from this process are unintentional and may not be corrected upon review.

## 2021-06-18 NOTE — Anesthesia Preprocedure Evaluation (Signed)
Anesthesia Evaluation  Patient identified by MRN, date of birth, ID band Patient awake    Reviewed: Allergy & Precautions, H&P , NPO status , Patient's Chart, lab work & pertinent test results, reviewed documented beta blocker date and time   Airway Mallampati: II  TM Distance: >3 FB Neck ROM: full    Dental no notable dental hx. (+) Edentulous Upper, Edentulous Lower   Pulmonary asthma , sleep apnea and Continuous Positive Airway Pressure Ventilation , COPD, former smoker,    Pulmonary exam normal breath sounds clear to auscultation       Cardiovascular Exercise Tolerance: Good hypertension, negative cardio ROS   Rhythm:regular Rate:Normal     Neuro/Psych Anxiety negative neurological ROS  negative psych ROS   GI/Hepatic Neg liver ROS, GERD  Medicated,  Endo/Other  Morbid obesity  Renal/GU negative Renal ROS  negative genitourinary   Musculoskeletal   Abdominal   Peds  Hematology negative hematology ROS (+)   Anesthesia Other Findings   Reproductive/Obstetrics negative OB ROS                             Anesthesia Physical  Anesthesia Plan  ASA: 3  Anesthesia Plan: General   Post-op Pain Management:    Induction:   PONV Risk Score and Plan: Propofol infusion  Airway Management Planned:   Additional Equipment:   Intra-op Plan:   Post-operative Plan:   Informed Consent: I have reviewed the patients History and Physical, chart, labs and discussed the procedure including the risks, benefits and alternatives for the proposed anesthesia with the patient or authorized representative who has indicated his/her understanding and acceptance.     Dental Advisory Given  Plan Discussed with: CRNA  Anesthesia Plan Comments:         Anesthesia Quick Evaluation

## 2021-06-18 NOTE — Discharge Instructions (Addendum)
  Colonoscopy Discharge Instructions  Read the instructions outlined below and refer to this sheet in the next few weeks. These discharge instructions provide you with general information on caring for yourself after you leave the hospital. Your doctor may also give you specific instructions. While your treatment has been planned according to the most current medical practices available, unavoidable complications occasionally occur. If you have any problems or questions after discharge, call Dr. Jena Gauss at [phone]. ACTIVITY You may resume your regular activity, but move at a slower pace for the next 24 hours.  Take frequent rest periods for the next 24 hours.  Walking will help get rid of the air and reduce the bloated feeling in your belly (abdomen).  No driving for 24 hours (because of the medicine (anesthesia) used during the test).   Do not sign any important legal documents or operate any machinery for 24 hours (because of the anesthesia used during the test).  NUTRITION Drink plenty of fluids.  You may resume your normal diet as instructed by your doctor.  Begin with a light meal and progress to your normal diet. Heavy or fried foods are harder to digest and may make you feel sick to your stomach (nauseated).  Avoid alcoholic beverages for 24 hours or as instructed.  MEDICATIONS You may resume your normal medications unless your doctor tells you otherwise.  WHAT YOU CAN EXPECT TODAY Some feelings of bloating in the abdomen.  Passage of more gas than usual.  Spotting of blood in your stool or on the toilet paper.  IF YOU HAD POLYPS REMOVED DURING THE COLONOSCOPY: No aspirin products for 7 days or as instructed.  No alcohol for 7 days or as instructed.  Eat a soft diet for the next 24 hours.  FINDING OUT THE RESULTS OF YOUR TEST Not all test results are available during your visit. If your test results are not back during the visit, make an appointment with your caregiver to find out the  results. Do not assume everything is normal if you have not heard from your caregiver or the medical facility. It is important for you to follow up on all of your test results.  SEEK IMMEDIATE MEDICAL ATTENTION IF: You have more than a spotting of blood in your stool.  Your belly is swollen (abdominal distention).  You are nauseated or vomiting.  You have a temperature over 101.  You have abdominal pain or discomfort that is severe or gets worse throughout the day.    No polyps found today.  Diverticulosis information provided.  Biopsies taken per plan.  Further recommendations to follow pending review of pathology report  At patient request, I called Tinnie Gens at [phone] -reviewed findings and recommendations

## 2021-06-19 DIAGNOSIS — M19011 Primary osteoarthritis, right shoulder: Secondary | ICD-10-CM | POA: Diagnosis not present

## 2021-06-19 DIAGNOSIS — M75101 Unspecified rotator cuff tear or rupture of right shoulder, not specified as traumatic: Secondary | ICD-10-CM | POA: Diagnosis not present

## 2021-06-19 DIAGNOSIS — M25511 Pain in right shoulder: Secondary | ICD-10-CM | POA: Diagnosis not present

## 2021-06-19 DIAGNOSIS — G8929 Other chronic pain: Secondary | ICD-10-CM | POA: Diagnosis not present

## 2021-06-19 NOTE — Anesthesia Postprocedure Evaluation (Signed)
Anesthesia Post Note  Patient: Jenna Rivera  Procedure(s) Performed: COLONOSCOPY WITH PROPOFOL BIOPSY  Patient location during evaluation: Short Stay Anesthesia Type: General Level of consciousness: awake and alert Pain management: pain level controlled Vital Signs Assessment: post-procedure vital signs reviewed and stable Respiratory status: spontaneous breathing Cardiovascular status: blood pressure returned to baseline and stable Postop Assessment: no apparent nausea or vomiting Anesthetic complications: no   No notable events documented.   Last Vitals:  Vitals:   06/18/21 1450 06/18/21 1453  BP:  123/81  Pulse: (!) 58   Resp: 14   Temp: 36.8 C   SpO2: 100%     Last Pain:  Vitals:   06/18/21 1450  TempSrc: Oral  PainSc: 0-No pain                 Branden Vine

## 2021-06-19 NOTE — Transfer of Care (Signed)
Immediate Anesthesia Transfer of Care Note  Patient: Jenna Rivera  Procedure(s) Performed: COLONOSCOPY WITH PROPOFOL BIOPSY  Patient Location: Short Stay  Anesthesia Type:General  Level of Consciousness: awake  Airway & Oxygen Therapy: Patient Spontanous Breathing  Post-op Assessment: Report given to RN  Post vital signs: Reviewed and stable  Last Vitals:  Vitals Value Taken Time  BP 123/81 06/18/21 1453  Temp 36.8 C 06/18/21 1450  Pulse 58 06/18/21 1450  Resp 14 06/18/21 1450  SpO2 100 % 06/18/21 1450    Last Pain:  Vitals:   06/18/21 1450  TempSrc: Oral  PainSc: 0-No pain      Patients Stated Pain Goal: 7 (06/18/21 1313)  Complications: No notable events documented.

## 2021-06-22 LAB — SURGICAL PATHOLOGY

## 2021-06-25 ENCOUNTER — Encounter: Payer: Self-pay | Admitting: Internal Medicine

## 2021-06-25 DIAGNOSIS — M25561 Pain in right knee: Secondary | ICD-10-CM | POA: Diagnosis not present

## 2021-06-25 DIAGNOSIS — M1711 Unilateral primary osteoarthritis, right knee: Secondary | ICD-10-CM | POA: Diagnosis not present

## 2021-07-03 DIAGNOSIS — J449 Chronic obstructive pulmonary disease, unspecified: Secondary | ICD-10-CM | POA: Diagnosis not present

## 2021-07-03 DIAGNOSIS — F419 Anxiety disorder, unspecified: Secondary | ICD-10-CM | POA: Diagnosis not present

## 2021-07-03 DIAGNOSIS — I10 Essential (primary) hypertension: Secondary | ICD-10-CM | POA: Diagnosis not present

## 2021-07-06 DIAGNOSIS — Z299 Encounter for prophylactic measures, unspecified: Secondary | ICD-10-CM | POA: Diagnosis not present

## 2021-07-06 DIAGNOSIS — Z789 Other specified health status: Secondary | ICD-10-CM | POA: Diagnosis not present

## 2021-07-06 DIAGNOSIS — F419 Anxiety disorder, unspecified: Secondary | ICD-10-CM | POA: Diagnosis not present

## 2021-07-06 DIAGNOSIS — M170 Bilateral primary osteoarthritis of knee: Secondary | ICD-10-CM | POA: Diagnosis not present

## 2021-07-06 DIAGNOSIS — M2550 Pain in unspecified joint: Secondary | ICD-10-CM | POA: Diagnosis not present

## 2021-07-06 DIAGNOSIS — I10 Essential (primary) hypertension: Secondary | ICD-10-CM | POA: Diagnosis not present

## 2021-07-06 DIAGNOSIS — Z6841 Body Mass Index (BMI) 40.0 and over, adult: Secondary | ICD-10-CM | POA: Diagnosis not present

## 2021-07-06 DIAGNOSIS — J441 Chronic obstructive pulmonary disease with (acute) exacerbation: Secondary | ICD-10-CM | POA: Diagnosis not present

## 2021-07-09 ENCOUNTER — Other Ambulatory Visit: Payer: Self-pay | Admitting: Gastroenterology

## 2021-07-09 DIAGNOSIS — R197 Diarrhea, unspecified: Secondary | ICD-10-CM

## 2021-07-15 NOTE — Op Note (Signed)
[facility] Patient Name: Jenna Rivera Procedure Date: 06/18/2021 2:14 PM MRN: [id number] Date of Birth: 1960/05/17 Attending MD: Gennette Pac , MD CSN: [id number] Age: 61 Admit Type: Outpatient Procedure:                Colonoscopy Indications:              Chronic diarrhea Providers:                Gennette Pac, MD, Crystal Page, Burke Keels, Technician, Hinton Rao Referring MD:              Medicines:                Propofol per Anesthesia Complications:            No immediate complications. Estimated Blood Loss:     Estimated blood loss was minimal. Procedure:                Pre-Anesthesia Assessment:                           - Prior to the procedure, a History and Physical                            was performed, and patient medications and                            allergies were reviewed. The patient's tolerance of                            previous anesthesia was also reviewed. The risks                            and benefits of the procedure and the sedation                            options and risks were discussed with the patient.                            All questions were answered, and informed consent                            was obtained. Prior Anticoagulants: The patient has                            taken no previous anticoagulant or antiplatelet                            agents. ASA Grade Assessment: II - A patient with                            mild systemic disease. After reviewing the risks  and benefits, the patient was deemed in                            satisfactory condition to undergo the procedure.                           After obtaining informed consent, the colonoscope                            was passed under direct vision. Throughout the                            procedure, the patient's blood pressure, pulse, and                            oxygen  saturations were monitored continuously. The                            [id number]) scope was introduced through the                            anus and advanced to the 10 cm into the ileum. The                            colonoscopy was performed without difficulty. The                            patient tolerated the procedure well. The quality                            of the bowel preparation was adequate. Scope In: 2:30:21 PM Scope Out: 2:45:54 PM Scope Withdrawal Time: 0 hours 10 minutes 56 seconds  Total Procedure Duration: 0 hours 15 minutes 33 seconds  Findings:      The perianal and digital rectal examinations were normal.      Scattered medium-mouthed diverticula were found in the sigmoid colon and       descending colon.      Non-bleeding internal hemorrhoids were found during retroflexion. The       hemorrhoids were mild and Grade I (internal hemorrhoids that do not       prolapse). Segmental biopsies of the left and right colon taken for       histologic study.      The exam was otherwise without abnormality on direct and retroflexion       views. Impression:               - Diverticulosis in the sigmoid colon and in the                            descending colon.                           - Non-bleeding internal hemorrhoids.                           - The examination was  otherwise normal on direct                            and retroflexion views. Status post segmental                            biopsy. Moderate Sedation:      Moderate (conscious) sedation was personally administered by an       anesthesia professional. The following parameters were monitored: oxygen       saturation, heart rate, blood pressure, respiratory rate, EKG, adequacy       of pulmonary ventilation, and response to care. Recommendation:           - Patient has a contact number available for                            emergencies. The signs and symptoms of potential                             delayed complications were discussed with the                            patient. Return to normal activities tomorrow.                            Written discharge instructions were provided to the                            patient.                           - Advance diet as tolerated. Follow-up on                            pathology. Further recommendations to follow. Procedure Code(s):        --- Professional ---                           [phone], Colonoscopy, flexible; diagnostic, including                            collection of specimen(s) by brushing or washing,                            when performed (separate procedure) Diagnosis Code(s):        --- Professional ---                           K64.0, First degree hemorrhoids                           K52.9, Noninfective gastroenteritis and colitis,                            unspecified  K57.30, Diverticulosis of large intestine without                            perforation or abscess without bleeding CPT copyright 2019 American [employer] Association. All rights reserved. The codes documented in this report are preliminary and upon coder review may  be revised to meet current compliance requirements. Cristopher Estimable. Temitope Griffing, MD Norvel Richards, MD 07/15/2021 8:06:30 AM This report has been signed electronically. Number of Addenda: 0

## 2021-07-16 ENCOUNTER — Encounter (HOSPITAL_COMMUNITY): Payer: Self-pay | Admitting: Internal Medicine

## 2021-07-23 DIAGNOSIS — H401131 Primary open-angle glaucoma, bilateral, mild stage: Secondary | ICD-10-CM | POA: Diagnosis not present

## 2021-08-05 DIAGNOSIS — I10 Essential (primary) hypertension: Secondary | ICD-10-CM | POA: Diagnosis not present

## 2021-08-05 DIAGNOSIS — J9611 Chronic respiratory failure with hypoxia: Secondary | ICD-10-CM | POA: Diagnosis not present

## 2021-08-05 DIAGNOSIS — F1120 Opioid dependence, uncomplicated: Secondary | ICD-10-CM | POA: Diagnosis not present

## 2021-08-05 DIAGNOSIS — Z789 Other specified health status: Secondary | ICD-10-CM | POA: Diagnosis not present

## 2021-08-05 DIAGNOSIS — Z299 Encounter for prophylactic measures, unspecified: Secondary | ICD-10-CM | POA: Diagnosis not present

## 2021-08-05 DIAGNOSIS — M2550 Pain in unspecified joint: Secondary | ICD-10-CM | POA: Diagnosis not present

## 2021-08-10 DIAGNOSIS — Z6841 Body Mass Index (BMI) 40.0 and over, adult: Secondary | ICD-10-CM | POA: Diagnosis not present

## 2021-08-10 DIAGNOSIS — Z1331 Encounter for screening for depression: Secondary | ICD-10-CM | POA: Diagnosis not present

## 2021-08-10 DIAGNOSIS — Z299 Encounter for prophylactic measures, unspecified: Secondary | ICD-10-CM | POA: Diagnosis not present

## 2021-08-10 DIAGNOSIS — E559 Vitamin D deficiency, unspecified: Secondary | ICD-10-CM | POA: Diagnosis not present

## 2021-08-10 DIAGNOSIS — Z7189 Other specified counseling: Secondary | ICD-10-CM | POA: Diagnosis not present

## 2021-08-10 DIAGNOSIS — E7800 Pure hypercholesterolemia, unspecified: Secondary | ICD-10-CM | POA: Diagnosis not present

## 2021-08-10 DIAGNOSIS — F17210 Nicotine dependence, cigarettes, uncomplicated: Secondary | ICD-10-CM | POA: Diagnosis not present

## 2021-08-10 DIAGNOSIS — F419 Anxiety disorder, unspecified: Secondary | ICD-10-CM | POA: Diagnosis not present

## 2021-08-10 DIAGNOSIS — Z0000 Encounter for general adult medical examination without abnormal findings: Secondary | ICD-10-CM | POA: Diagnosis not present

## 2021-08-10 DIAGNOSIS — Z1339 Encounter for screening examination for other mental health and behavioral disorders: Secondary | ICD-10-CM | POA: Diagnosis not present

## 2021-08-10 DIAGNOSIS — Z79899 Other long term (current) drug therapy: Secondary | ICD-10-CM | POA: Diagnosis not present

## 2021-08-10 DIAGNOSIS — R635 Abnormal weight gain: Secondary | ICD-10-CM | POA: Diagnosis not present

## 2021-08-10 DIAGNOSIS — Z87891 Personal history of nicotine dependence: Secondary | ICD-10-CM | POA: Diagnosis not present

## 2021-08-10 DIAGNOSIS — R5383 Other fatigue: Secondary | ICD-10-CM | POA: Diagnosis not present

## 2021-09-04 DIAGNOSIS — Z299 Encounter for prophylactic measures, unspecified: Secondary | ICD-10-CM | POA: Diagnosis not present

## 2021-09-04 DIAGNOSIS — I10 Essential (primary) hypertension: Secondary | ICD-10-CM | POA: Diagnosis not present

## 2021-09-04 DIAGNOSIS — M2550 Pain in unspecified joint: Secondary | ICD-10-CM | POA: Diagnosis not present

## 2021-09-04 DIAGNOSIS — Z713 Dietary counseling and surveillance: Secondary | ICD-10-CM | POA: Diagnosis not present

## 2021-09-29 DIAGNOSIS — M170 Bilateral primary osteoarthritis of knee: Secondary | ICD-10-CM | POA: Diagnosis not present

## 2021-10-05 DIAGNOSIS — Z299 Encounter for prophylactic measures, unspecified: Secondary | ICD-10-CM | POA: Diagnosis not present

## 2021-10-05 DIAGNOSIS — Z6839 Body mass index (BMI) 39.0-39.9, adult: Secondary | ICD-10-CM | POA: Diagnosis not present

## 2021-10-05 DIAGNOSIS — M5020 Other cervical disc displacement, unspecified cervical region: Secondary | ICD-10-CM | POA: Diagnosis not present

## 2021-10-05 DIAGNOSIS — J9611 Chronic respiratory failure with hypoxia: Secondary | ICD-10-CM | POA: Diagnosis not present

## 2021-10-05 DIAGNOSIS — I10 Essential (primary) hypertension: Secondary | ICD-10-CM | POA: Diagnosis not present

## 2021-10-08 DIAGNOSIS — G4733 Obstructive sleep apnea (adult) (pediatric): Secondary | ICD-10-CM | POA: Diagnosis not present

## 2021-11-04 DIAGNOSIS — I10 Essential (primary) hypertension: Secondary | ICD-10-CM | POA: Diagnosis not present

## 2021-11-04 DIAGNOSIS — M2550 Pain in unspecified joint: Secondary | ICD-10-CM | POA: Diagnosis not present

## 2021-11-04 DIAGNOSIS — Z299 Encounter for prophylactic measures, unspecified: Secondary | ICD-10-CM | POA: Diagnosis not present

## 2021-11-04 DIAGNOSIS — F419 Anxiety disorder, unspecified: Secondary | ICD-10-CM | POA: Diagnosis not present

## 2021-11-18 ENCOUNTER — Other Ambulatory Visit: Payer: Self-pay | Admitting: Gastroenterology

## 2021-11-18 DIAGNOSIS — R197 Diarrhea, unspecified: Secondary | ICD-10-CM

## 2021-12-04 DIAGNOSIS — Z299 Encounter for prophylactic measures, unspecified: Secondary | ICD-10-CM | POA: Diagnosis not present

## 2021-12-04 DIAGNOSIS — Z789 Other specified health status: Secondary | ICD-10-CM | POA: Diagnosis not present

## 2021-12-04 DIAGNOSIS — L299 Pruritus, unspecified: Secondary | ICD-10-CM | POA: Diagnosis not present

## 2021-12-04 DIAGNOSIS — M2550 Pain in unspecified joint: Secondary | ICD-10-CM | POA: Diagnosis not present

## 2021-12-04 DIAGNOSIS — J439 Emphysema, unspecified: Secondary | ICD-10-CM | POA: Diagnosis not present

## 2021-12-04 DIAGNOSIS — J3489 Other specified disorders of nose and nasal sinuses: Secondary | ICD-10-CM | POA: Diagnosis not present

## 2021-12-04 DIAGNOSIS — I10 Essential (primary) hypertension: Secondary | ICD-10-CM | POA: Diagnosis not present

## 2021-12-21 DIAGNOSIS — Z299 Encounter for prophylactic measures, unspecified: Secondary | ICD-10-CM | POA: Diagnosis not present

## 2021-12-21 DIAGNOSIS — J029 Acute pharyngitis, unspecified: Secondary | ICD-10-CM | POA: Diagnosis not present

## 2021-12-21 DIAGNOSIS — J439 Emphysema, unspecified: Secondary | ICD-10-CM | POA: Diagnosis not present

## 2021-12-21 DIAGNOSIS — Z6839 Body mass index (BMI) 39.0-39.9, adult: Secondary | ICD-10-CM | POA: Diagnosis not present

## 2021-12-21 DIAGNOSIS — J020 Streptococcal pharyngitis: Secondary | ICD-10-CM | POA: Diagnosis not present

## 2021-12-30 DIAGNOSIS — M47816 Spondylosis without myelopathy or radiculopathy, lumbar region: Secondary | ICD-10-CM | POA: Diagnosis not present

## 2021-12-30 DIAGNOSIS — M170 Bilateral primary osteoarthritis of knee: Secondary | ICD-10-CM | POA: Diagnosis not present

## 2021-12-30 DIAGNOSIS — M2392 Unspecified internal derangement of left knee: Secondary | ICD-10-CM | POA: Diagnosis not present

## 2022-01-07 DIAGNOSIS — M2550 Pain in unspecified joint: Secondary | ICD-10-CM | POA: Diagnosis not present

## 2022-01-07 DIAGNOSIS — Z299 Encounter for prophylactic measures, unspecified: Secondary | ICD-10-CM | POA: Diagnosis not present

## 2022-01-07 DIAGNOSIS — J441 Chronic obstructive pulmonary disease with (acute) exacerbation: Secondary | ICD-10-CM | POA: Diagnosis not present

## 2022-01-07 DIAGNOSIS — I10 Essential (primary) hypertension: Secondary | ICD-10-CM | POA: Diagnosis not present

## 2022-01-07 DIAGNOSIS — J439 Emphysema, unspecified: Secondary | ICD-10-CM | POA: Diagnosis not present

## 2022-02-08 DIAGNOSIS — Z299 Encounter for prophylactic measures, unspecified: Secondary | ICD-10-CM | POA: Diagnosis not present

## 2022-02-08 DIAGNOSIS — L0591 Pilonidal cyst without abscess: Secondary | ICD-10-CM | POA: Diagnosis not present

## 2022-02-08 DIAGNOSIS — M2550 Pain in unspecified joint: Secondary | ICD-10-CM | POA: Diagnosis not present

## 2022-02-08 DIAGNOSIS — K219 Gastro-esophageal reflux disease without esophagitis: Secondary | ICD-10-CM | POA: Diagnosis not present

## 2022-02-08 DIAGNOSIS — Z6837 Body mass index (BMI) 37.0-37.9, adult: Secondary | ICD-10-CM | POA: Diagnosis not present

## 2022-02-08 DIAGNOSIS — I10 Essential (primary) hypertension: Secondary | ICD-10-CM | POA: Diagnosis not present

## 2022-02-22 DIAGNOSIS — M533 Sacrococcygeal disorders, not elsewhere classified: Secondary | ICD-10-CM | POA: Diagnosis not present

## 2022-03-10 DIAGNOSIS — J439 Emphysema, unspecified: Secondary | ICD-10-CM | POA: Diagnosis not present

## 2022-03-10 DIAGNOSIS — I10 Essential (primary) hypertension: Secondary | ICD-10-CM | POA: Diagnosis not present

## 2022-03-10 DIAGNOSIS — M2550 Pain in unspecified joint: Secondary | ICD-10-CM | POA: Diagnosis not present

## 2022-03-10 DIAGNOSIS — Z79899 Other long term (current) drug therapy: Secondary | ICD-10-CM | POA: Diagnosis not present

## 2022-03-10 DIAGNOSIS — L02416 Cutaneous abscess of left lower limb: Secondary | ICD-10-CM | POA: Diagnosis not present

## 2022-03-10 DIAGNOSIS — Z299 Encounter for prophylactic measures, unspecified: Secondary | ICD-10-CM | POA: Diagnosis not present

## 2022-03-31 DIAGNOSIS — M170 Bilateral primary osteoarthritis of knee: Secondary | ICD-10-CM | POA: Diagnosis not present

## 2022-03-31 DIAGNOSIS — M2392 Unspecified internal derangement of left knee: Secondary | ICD-10-CM | POA: Diagnosis not present

## 2022-04-05 DIAGNOSIS — C44319 Basal cell carcinoma of skin of other parts of face: Secondary | ICD-10-CM | POA: Diagnosis not present

## 2022-04-07 DIAGNOSIS — I10 Essential (primary) hypertension: Secondary | ICD-10-CM | POA: Diagnosis not present

## 2022-04-07 DIAGNOSIS — Z1339 Encounter for screening examination for other mental health and behavioral disorders: Secondary | ICD-10-CM | POA: Diagnosis not present

## 2022-04-07 DIAGNOSIS — Z0000 Encounter for general adult medical examination without abnormal findings: Secondary | ICD-10-CM | POA: Diagnosis not present

## 2022-04-07 DIAGNOSIS — Z1331 Encounter for screening for depression: Secondary | ICD-10-CM | POA: Diagnosis not present

## 2022-04-07 DIAGNOSIS — M5020 Other cervical disc displacement, unspecified cervical region: Secondary | ICD-10-CM | POA: Diagnosis not present

## 2022-04-07 DIAGNOSIS — Z299 Encounter for prophylactic measures, unspecified: Secondary | ICD-10-CM | POA: Diagnosis not present

## 2022-04-07 DIAGNOSIS — Z7189 Other specified counseling: Secondary | ICD-10-CM | POA: Diagnosis not present

## 2022-04-07 DIAGNOSIS — E65 Localized adiposity: Secondary | ICD-10-CM | POA: Diagnosis not present

## 2022-04-07 DIAGNOSIS — F1120 Opioid dependence, uncomplicated: Secondary | ICD-10-CM | POA: Diagnosis not present

## 2022-04-25 IMAGING — MG MM DIGITAL SCREENING BILAT W/ TOMO AND CAD
6 of 10 series · 6 of 30 positions shown · non-contrast
Comparison: Previous exam(s).

ACR Breast Density Category a: The breast tissue is almost entirely
fatty.

CLINICAL DATA: Screening.

EXAM:
DIGITAL SCREENING BILATERAL MAMMOGRAM WITH TOMOSYNTHESIS AND CAD
TECHNIQUE: Bilateral screening digital craniocaudal and mediolateral oblique
mammograms were obtained. Bilateral screening digital breast
tomosynthesis was performed. The images were evaluated with
computer-aided detection.

[L CC synth-2D]
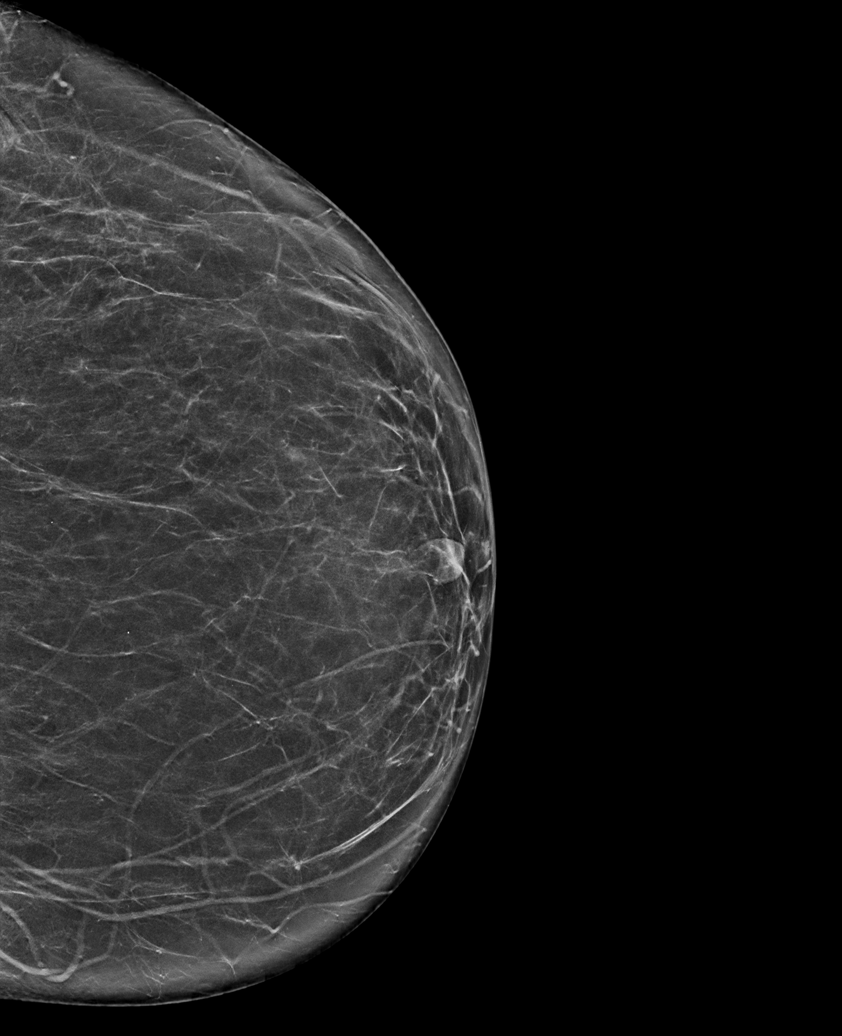

[R CC synth-2D (1 of 2)]
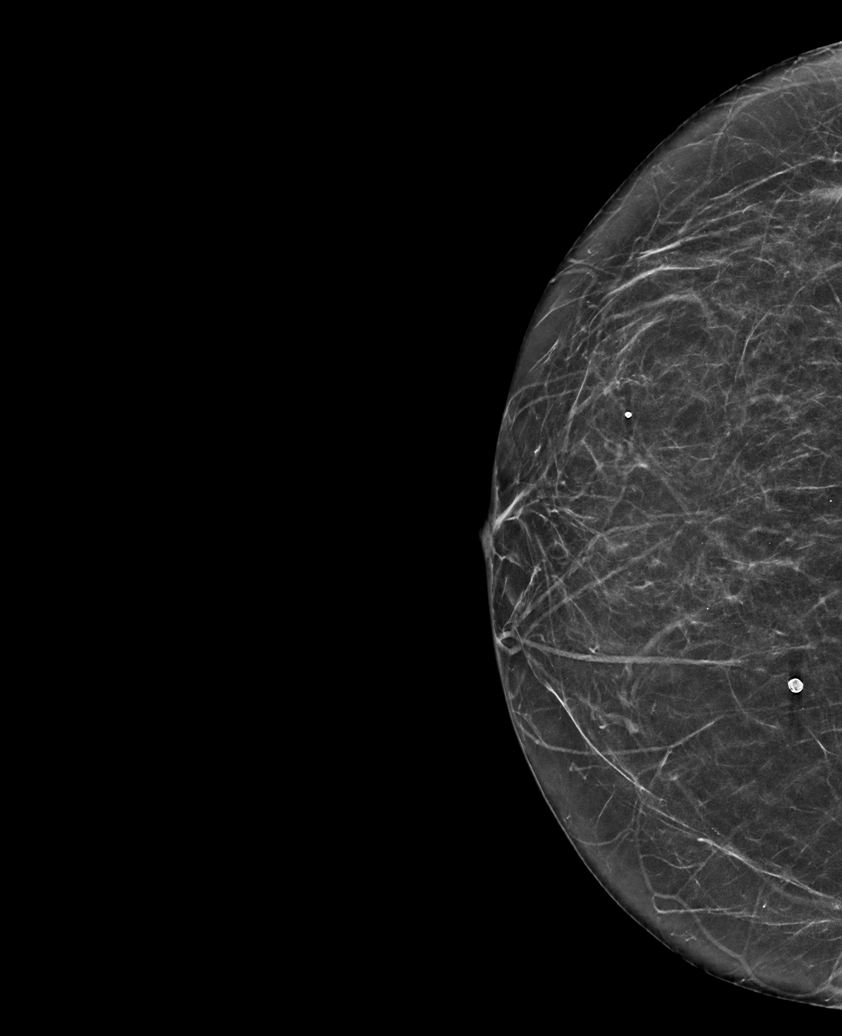

[R MLO synth-2D]
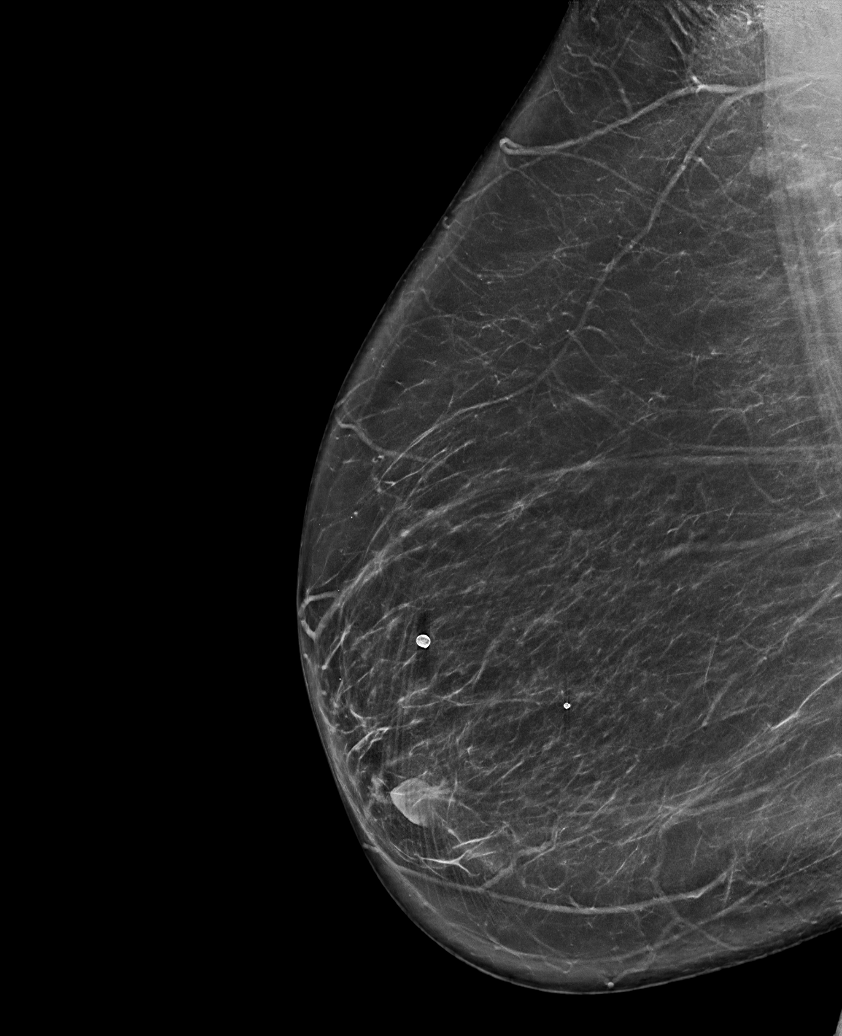

[L MLO synth-2D]
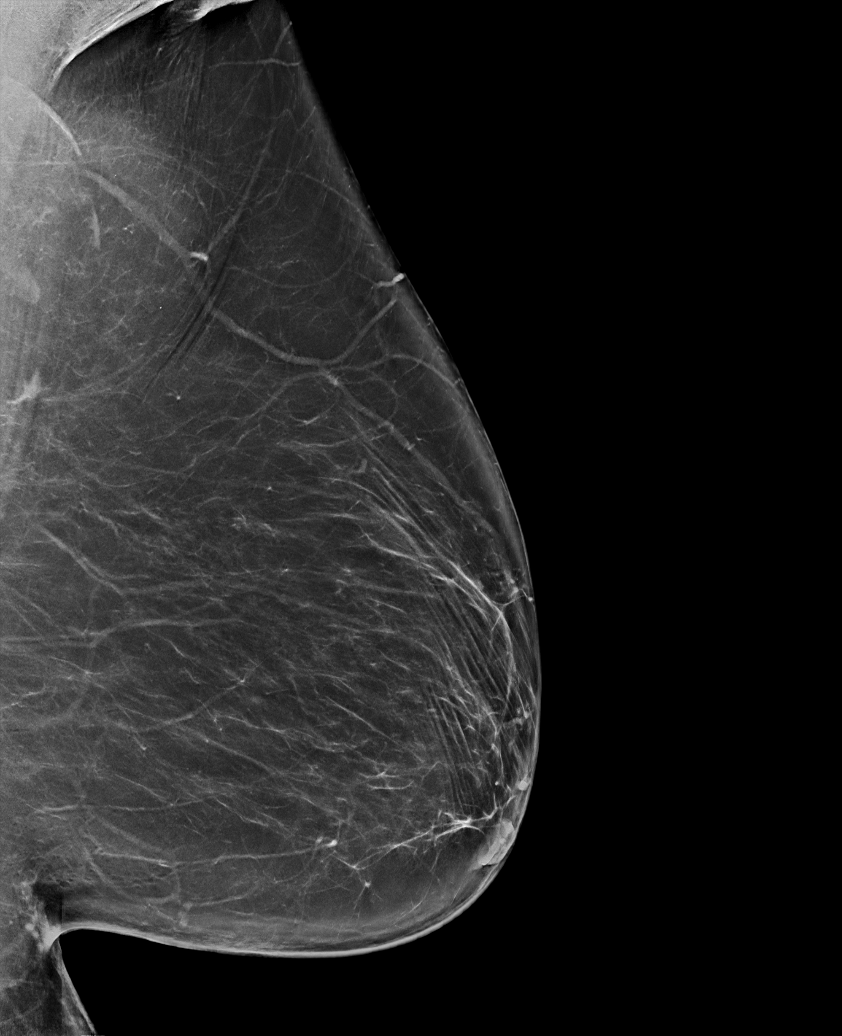

[R CC synth-2D (2 of 2)]
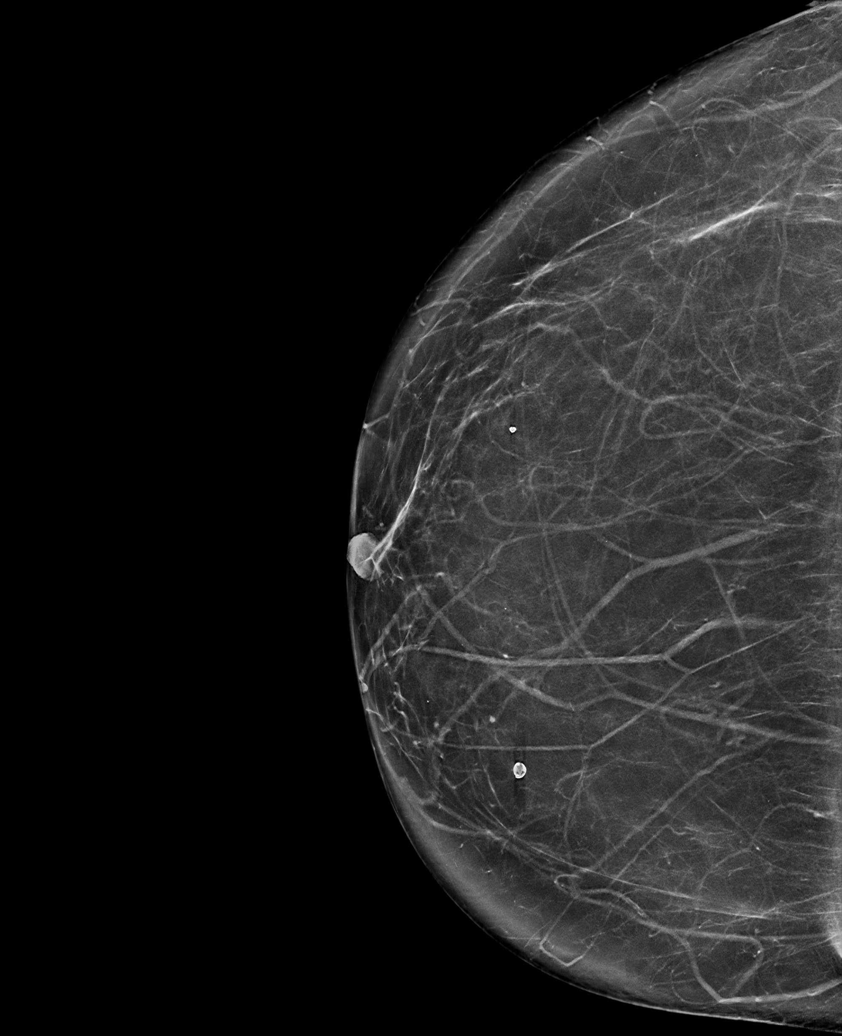

[R CC tomo · tomo slice 40/79.0]
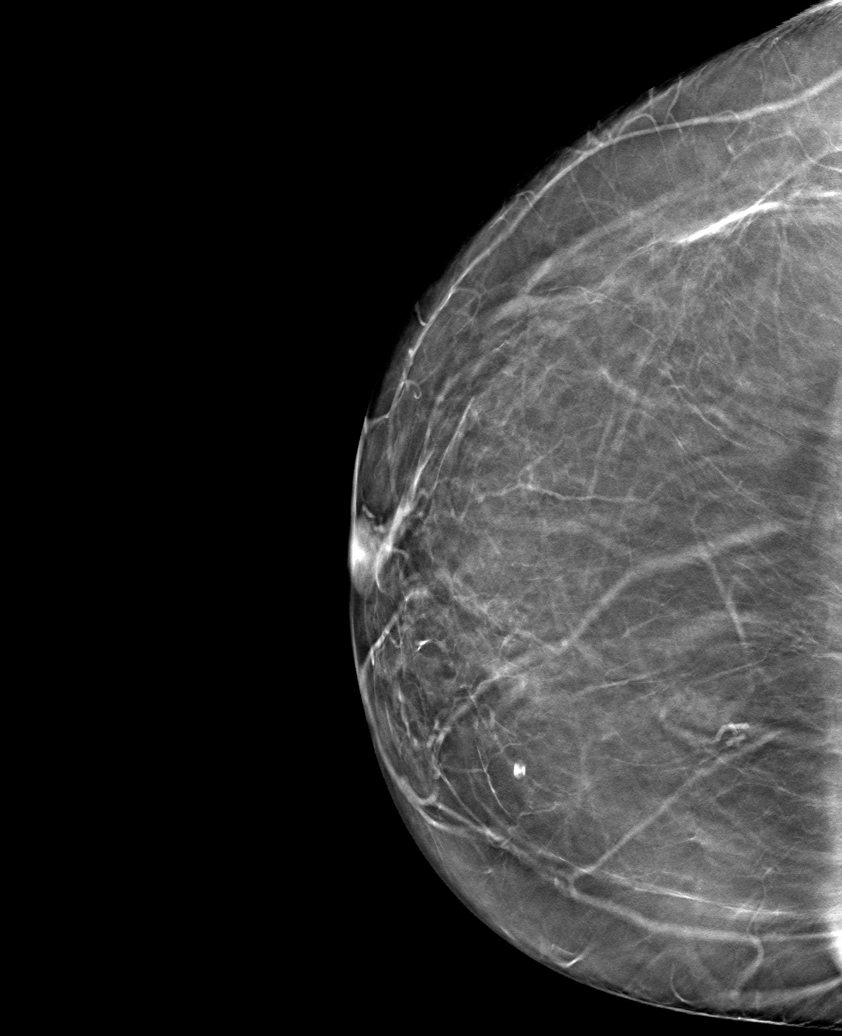

[6 of 30 positions shown; findings below may reference images not displayed]

FINDINGS: There are no findings suspicious for malignancy.
IMPRESSION: No mammographic evidence of malignancy. A result letter of this
screening mammogram will be mailed directly to the patient.

RECOMMENDATION:
Screening mammogram in one year. (Code:0E-3-N98)

BI-RADS CATEGORY  1: Negative.

## 2022-05-04 ENCOUNTER — Other Ambulatory Visit: Payer: Self-pay | Admitting: Internal Medicine

## 2022-05-04 DIAGNOSIS — Z1231 Encounter for screening mammogram for malignant neoplasm of breast: Secondary | ICD-10-CM

## 2022-05-05 DIAGNOSIS — D225 Melanocytic nevi of trunk: Secondary | ICD-10-CM | POA: Diagnosis not present

## 2022-05-05 DIAGNOSIS — C44319 Basal cell carcinoma of skin of other parts of face: Secondary | ICD-10-CM | POA: Diagnosis not present

## 2022-05-07 DIAGNOSIS — I10 Essential (primary) hypertension: Secondary | ICD-10-CM | POA: Diagnosis not present

## 2022-05-07 DIAGNOSIS — M549 Dorsalgia, unspecified: Secondary | ICD-10-CM | POA: Diagnosis not present

## 2022-05-07 DIAGNOSIS — J439 Emphysema, unspecified: Secondary | ICD-10-CM | POA: Diagnosis not present

## 2022-05-07 DIAGNOSIS — G4733 Obstructive sleep apnea (adult) (pediatric): Secondary | ICD-10-CM | POA: Diagnosis not present

## 2022-05-07 DIAGNOSIS — Z299 Encounter for prophylactic measures, unspecified: Secondary | ICD-10-CM | POA: Diagnosis not present

## 2022-05-07 DIAGNOSIS — J9611 Chronic respiratory failure with hypoxia: Secondary | ICD-10-CM | POA: Diagnosis not present

## 2022-05-18 ENCOUNTER — Other Ambulatory Visit: Payer: Self-pay | Admitting: Family Medicine

## 2022-05-18 ENCOUNTER — Ambulatory Visit
Admission: RE | Admit: 2022-05-18 | Discharge: 2022-05-18 | Disposition: A | Discharge status: Home / Self Care | Payer: PPO | Source: Ambulatory Visit | Attending: Internal Medicine | Admitting: Internal Medicine

## 2022-05-18 DIAGNOSIS — Z1231 Encounter for screening mammogram for malignant neoplasm of breast: Secondary | ICD-10-CM | POA: Diagnosis not present

## 2022-06-08 DIAGNOSIS — M549 Dorsalgia, unspecified: Secondary | ICD-10-CM | POA: Diagnosis not present

## 2022-06-08 DIAGNOSIS — Z299 Encounter for prophylactic measures, unspecified: Secondary | ICD-10-CM | POA: Diagnosis not present

## 2022-06-08 DIAGNOSIS — I10 Essential (primary) hypertension: Secondary | ICD-10-CM | POA: Diagnosis not present

## 2022-06-08 DIAGNOSIS — R3911 Hesitancy of micturition: Secondary | ICD-10-CM | POA: Diagnosis not present

## 2022-06-11 DIAGNOSIS — G4733 Obstructive sleep apnea (adult) (pediatric): Secondary | ICD-10-CM | POA: Diagnosis not present

## 2022-06-15 ENCOUNTER — Ambulatory Visit (INDEPENDENT_AMBULATORY_CARE_PROVIDER_SITE_OTHER): Payer: PPO | Admitting: Pulmonary Disease

## 2022-06-15 ENCOUNTER — Encounter (HOSPITAL_BASED_OUTPATIENT_CLINIC_OR_DEPARTMENT_OTHER): Payer: Self-pay | Admitting: Pulmonary Disease

## 2022-06-15 VITALS — BP 122/64 | HR 59 | Temp 98.0°F | Ht 61.0 in | Wt 186.4 lb

## 2022-06-15 DIAGNOSIS — J449 Chronic obstructive pulmonary disease, unspecified: Secondary | ICD-10-CM

## 2022-06-15 MED ORDER — ALBUTEROL SULFATE HFA 108 (90 BASE) MCG/ACT IN AERS: 1.0000 | INHALATION_SPRAY | RESPIRATORY_TRACT | 2 refills | Status: DC | PRN

## 2022-06-15 NOTE — Progress Notes (Signed)
Subjective:   PATIENT ID: Jenna Rivera GENDER: female DOB: 1960/10/27, MRN: [id number]  Chief Complaint  Patient presents with   Consult    Consult. Patient has no complaints.     Reason for Visit: New consult for shortness of breath  [name] is a 62 year old female former smoker with COPD, chronic diarrhea, HTN, OSA on CPAP who presents to establish care.  She previously was seen by Dr. Sherene Sires with last visit 10/12/2018. She was diagnosed with COPD in 2015 requiring hospitalization, no vent. She is currently on Breo and uses albuterol 3 times a week. Needs rescue with activity with wheezing and cough. She reports shortness of breath triggered by extremes of hot and cold, dust and illness. Has chronic sinus drainage. May have had childhood asthma. Her last exacerbation 12/2021. She has intentionally lost over 100 lbs over the last two years. After weight her breathing has improved and has reduced her exacerbations to 1-2 times a year vs monthly. She is compliant with CPAP which helps with her quality of sleep. Has oxygen.  Social History: Quit smoking in 1993. 1 ppd x 20 years Disabled  Half sister with Candise Bowens, another patient  I have personally reviewed patient's past medical/family/social history, allergies, current medications.  Past Medical History:  Diagnosis Date   Acute bronchitis with COPD (HCC)    Allergic rhinitis    Anxiety    Asthma    Cervical disc herniation    Chest pain    Chronic pain    COPD (chronic obstructive pulmonary disease) (HCC)    Edema    HTN (hypertension)    Hyperlipidemia    Lumbago    Neck pain    Osteoporosis    Reflux esophagitis    Rosacea    Sleep apnea    uses CPAP     Family History  Problem Relation Age of Onset   COPD Mother    Cancer Father    Breast cancer Sister    Lung cancer Sister    Multiple sclerosis Sister    Colon cancer Neg Hx        family history is limited.      Social History    Occupational History   Not on file  Tobacco Use   Smoking status: Former    Packs/day: 1.00    Years: 15.00    Additional pack years: 0.00    Total pack years: 15.00    Types: Cigarettes    Quit date: 01/05/1991    Years since quitting: 31.4   Smokeless tobacco: Never  Vaping Use   Vaping Use: Never used  Substance and Sexual Activity   Alcohol use: Yes    Comment: occasional   Drug use: Never   Sexual activity: Not Currently    No Known Allergies   Outpatient Medications Prior to Visit  Medication Sig Dispense Refill   acetaminophen (TYLENOL) 500 MG tablet Take 500-1,000 mg by mouth every 6 (six) hours as needed (for pain.).     albuterol (PROVENTIL) (2.5 MG/3ML) 0.083% nebulizer solution Inhale 3 mLs into the lungs every 6 (six) hours as needed (wheezing/shortness of breath.).      Ascorbic Acid (VITAMIN C) 1000 MG tablet Take 1,000 mg by mouth in the morning.     Budeson-Glycopyrrol-Formoterol (BREZTRI AEROSPHERE) 160-9-4.8 MCG/ACT AERO Inhale 1 puff into the lungs in the morning and at bedtime.     calcium carbonate (TUMS - DOSED IN MG ELEMENTAL  CALCIUM) 500 MG chewable tablet Chew 1 tablet by mouth 3 (three) times a week.     Cholecalciferol (VITAMIN D3) 50 MCG (2000 UT) TABS Take 2,000 Units by mouth in the morning.     colestipol (COLESTID) 1 g tablet Take 1 tablet (1 g total) by mouth 2 (two) times daily as needed (diarrhea). Do not take within two hours of other medications. 60 tablet 5   dicyclomine (BENTYL) 10 MG capsule TAKE 1 CAPSULE BY MOUTH FOUR TIMES DAILY - BEFORE MEALS AND AT BEDTIME 120 capsule 3   fluticasone (FLONASE) 50 MCG/ACT nasal spray Place 2 sprays into both nostrils daily as needed for allergies.     furosemide (LASIX) 40 MG tablet Take 20-40 mg by mouth daily as needed (fluid retention.).      gabapentin (NEURONTIN) 300 MG capsule Take 300-600 mg by mouth See admin instructions. Take 1 capsule (300 mg) by mouth in the morning & take 2 capsules (600  mg) by mouth at night     HYDROcodone-acetaminophen (NORCO) 7.5-325 MG tablet Take 1 tablet by mouth every 6 (six) hours as needed (pain.).     ipratropium-albuterol (DUONEB) 0.5-2.5 (3) MG/3ML SOLN Take 3 mLs by nebulization every 4 (four) hours as needed (wheezing/shortness of breath.).      latanoprost (XALATAN) 0.005 % ophthalmic solution Place 1 drop into both eyes at bedtime.      lisinopril (ZESTRIL) 5 MG tablet Take 5 mg by mouth in the morning.     LORazepam (ATIVAN) 1 MG tablet Take 1 mg by mouth 2 (two) times daily as needed for anxiety.      meloxicam (MOBIC) 15 MG tablet Take 15 mg by mouth daily.     montelukast (SINGULAIR) 10 MG tablet Take 10 mg by mouth at bedtime.     Multiple Vitamin (MULTIVITAMIN WITH MINERALS) TABS tablet Take 1 tablet by mouth in the morning.     pantoprazole (PROTONIX) 40 MG tablet Take 40 mg by mouth daily before breakfast.     polyethylene glycol-electrolytes (NULYTELY) 420 g solution As directed 4000 mL 0   tiZANidine (ZANAFLEX) 4 MG tablet Take 4 mg by mouth every 8 (eight) hours as needed for muscle spasms.     albuterol (VENTOLIN HFA) 108 (90 Base) MCG/ACT inhaler Inhale 2 puffs into the lungs every 6 (six) hours as needed for wheezing or shortness of breath.     No facility-administered medications prior to visit.    Review of Systems  Constitutional:  Negative for chills, diaphoresis, fever, malaise/fatigue and weight loss.  HENT:  Positive for congestion.   Respiratory:  Positive for shortness of breath. Negative for cough, hemoptysis, sputum production and wheezing.   Cardiovascular:  Negative for chest pain, palpitations and leg swelling.     Objective:   Vitals:   06/15/22 1334  BP: 122/64  Pulse: (!) 59  Temp: 98 F (36.7 C)  TempSrc: Oral  SpO2: 99%  Weight: 186 lb 6.4 oz (84.6 kg)  Height: 5\' 1"  (1.549 m)   SpO2: 99 % O2 Device: None (Room air)  Physical Exam: General: Well-appearing, no acute distress HENT: [place],  AT Eyes: EOMI, no scleral icterus Respiratory: Clear to auscultation bilaterally.  No crackles, wheezing or rales Cardiovascular: RRR, -M/R/G, no JVD Extremities:-Edema,-tenderness Neuro: AAO x4, CNII-XII grossly intact Psych: Normal mood, normal affect  Data Reviewed:  Imaging: CXR 07/03/18 - Hyperinflated  PFT: None on file  Labs: CBC No results found for: "WBC", "RBC", "HGB", "HCT", "PLT", "MCV", "  MCH", "MCHC", "RDW", "LYMPHSABS", "MONOABS", "EOSABS", "BASOSABS"      Assessment & Plan:   Discussion: 62 year old female former smoker with COPD, chronic diarrhea, HTN, OSA on CPAP who presents to establish care. Discussed clinical course and management of COPD including bronchodilator regimen, preventive care including vaccinations and action plan for exacerbation.  COPD --CONTINUE Breo 200 mcg ONE puff ONCE a day. Samples per PCP --CONTINUE Albuterol AS NEEDED for shortness of breath or wheezing. ORDERED --ORDER pulmonary function tests prior to next visit --Daytime oxygen only needed for <88%  Health Maintenance Immunization History  Administered Date(s) Administered   Influenza,inj,quad, With Preservative 09/08/2018   Moderna Sars-Covid-2 Vaccination 03/08/2019, 04/10/2019   Tdap 03/09/2018   CT Lung Screen - not qualified. Quit in 1993  No orders of the defined types were placed in this encounter.  Meds ordered this encounter  Medications   albuterol (VENTOLIN HFA) 108 (90 Base) MCG/ACT inhaler    Sig: Inhale 1-2 puffs into the lungs every 4 (four) hours as needed for wheezing or shortness of breath.    Dispense:  3 each    Refill:  2    Ok to substitute for insurance preferred    Return in about 3 months (around 09/15/2022) for after PFT.  I have spent a total time of 45-minutes on the day of the appointment reviewing prior documentation, coordinating care and discussing medical diagnosis and plan with the patient/family. Imaging, labs and tests included in  this note have been reviewed and interpreted independently by me.  Quinlin Conant Mechele Collin, MD [facility] 06/15/2022 2:07 PM  Office Number [phone]

## 2022-06-15 NOTE — Addendum Note (Signed)
Addended by: Luciano Cutter on: 06/15/2022 02:13 PM   Modules accepted: Orders

## 2022-06-15 NOTE — Patient Instructions (Signed)
COPD --CONTINUE Breo 200 mcg ONE puff ONCE a day. Samples per PCP --CONTINUE Albuterol AS NEEDED for shortness of breath or wheezing. ORDERED --ORDER pulmonary function tests prior to next visit --Daytime oxygen only needed for <88%

## 2022-06-21 DIAGNOSIS — L308 Other specified dermatitis: Secondary | ICD-10-CM | POA: Diagnosis not present

## 2022-06-21 DIAGNOSIS — Z85828 Personal history of other malignant neoplasm of skin: Secondary | ICD-10-CM | POA: Diagnosis not present

## 2022-06-21 DIAGNOSIS — Z08 Encounter for follow-up examination after completed treatment for malignant neoplasm: Secondary | ICD-10-CM | POA: Diagnosis not present

## 2022-07-01 DIAGNOSIS — M170 Bilateral primary osteoarthritis of knee: Secondary | ICD-10-CM | POA: Diagnosis not present

## 2022-07-09 DIAGNOSIS — I10 Essential (primary) hypertension: Secondary | ICD-10-CM | POA: Diagnosis not present

## 2022-07-09 DIAGNOSIS — Z299 Encounter for prophylactic measures, unspecified: Secondary | ICD-10-CM | POA: Diagnosis not present

## 2022-07-09 DIAGNOSIS — M2550 Pain in unspecified joint: Secondary | ICD-10-CM | POA: Diagnosis not present

## 2022-07-09 DIAGNOSIS — F419 Anxiety disorder, unspecified: Secondary | ICD-10-CM | POA: Diagnosis not present

## 2022-07-09 DIAGNOSIS — M5020 Other cervical disc displacement, unspecified cervical region: Secondary | ICD-10-CM | POA: Diagnosis not present

## 2022-07-25 ENCOUNTER — Other Ambulatory Visit: Payer: Self-pay | Admitting: Gastroenterology

## 2022-07-25 DIAGNOSIS — R197 Diarrhea, unspecified: Secondary | ICD-10-CM

## 2022-07-28 DIAGNOSIS — H401131 Primary open-angle glaucoma, bilateral, mild stage: Secondary | ICD-10-CM | POA: Diagnosis not present

## 2022-08-09 DIAGNOSIS — M5020 Other cervical disc displacement, unspecified cervical region: Secondary | ICD-10-CM | POA: Diagnosis not present

## 2022-08-09 DIAGNOSIS — I10 Essential (primary) hypertension: Secondary | ICD-10-CM | POA: Diagnosis not present

## 2022-08-09 DIAGNOSIS — J9611 Chronic respiratory failure with hypoxia: Secondary | ICD-10-CM | POA: Diagnosis not present

## 2022-08-09 DIAGNOSIS — Z299 Encounter for prophylactic measures, unspecified: Secondary | ICD-10-CM | POA: Diagnosis not present

## 2022-08-13 DIAGNOSIS — M5020 Other cervical disc displacement, unspecified cervical region: Secondary | ICD-10-CM | POA: Diagnosis not present

## 2022-08-13 DIAGNOSIS — F419 Anxiety disorder, unspecified: Secondary | ICD-10-CM | POA: Diagnosis not present

## 2022-08-13 DIAGNOSIS — Z79899 Other long term (current) drug therapy: Secondary | ICD-10-CM | POA: Diagnosis not present

## 2022-08-13 DIAGNOSIS — Z0000 Encounter for general adult medical examination without abnormal findings: Secondary | ICD-10-CM | POA: Diagnosis not present

## 2022-08-13 DIAGNOSIS — I10 Essential (primary) hypertension: Secondary | ICD-10-CM | POA: Diagnosis not present

## 2022-08-13 DIAGNOSIS — E7800 Pure hypercholesterolemia, unspecified: Secondary | ICD-10-CM | POA: Diagnosis not present

## 2022-08-13 DIAGNOSIS — Z299 Encounter for prophylactic measures, unspecified: Secondary | ICD-10-CM | POA: Diagnosis not present

## 2022-08-25 DIAGNOSIS — G4733 Obstructive sleep apnea (adult) (pediatric): Secondary | ICD-10-CM | POA: Diagnosis not present

## 2022-09-17 ENCOUNTER — Ambulatory Visit (HOSPITAL_BASED_OUTPATIENT_CLINIC_OR_DEPARTMENT_OTHER): Payer: PPO | Admitting: Pulmonary Disease

## 2022-09-17 ENCOUNTER — Encounter (HOSPITAL_BASED_OUTPATIENT_CLINIC_OR_DEPARTMENT_OTHER): Payer: Self-pay | Admitting: Pulmonary Disease

## 2022-09-17 VITALS — BP 146/60 | HR 72 | Resp 20 | Ht 60.0 in | Wt 183.0 lb

## 2022-09-17 DIAGNOSIS — J449 Chronic obstructive pulmonary disease, unspecified: Secondary | ICD-10-CM | POA: Diagnosis not present

## 2022-09-17 DIAGNOSIS — J4489 Other specified chronic obstructive pulmonary disease: Secondary | ICD-10-CM

## 2022-09-17 LAB — PULMONARY FUNCTION TEST
DL/VA % pred: 125 %
DL/VA: 5.4 ml/min/mmHg/L
DLCO cor % pred: 137 %
DLCO cor: 23.81 ml/min/mmHg
DLCO unc % pred: 137 %
DLCO unc: 23.81 ml/min/mmHg
FEF 25-75 Post: 1.13 L/s
FEF 25-75 Pre: 0.73 L/s
FEF2575-%Change-Post: 54 %
FEF2575-%Pred-Post: 56 %
FEF2575-%Pred-Pre: 36 %
FEV1-%Change-Post: 16 %
FEV1-%Pred-Post: 71 %
FEV1-%Pred-Pre: 61 %
FEV1-Post: 1.5 L
FEV1-Pre: 1.29 L
FEV1FVC-%Change-Post: 5 %
FEV1FVC-%Pred-Pre: 79 %
FEV6-%Change-Post: 10 %
FEV6-%Pred-Post: 87 %
FEV6-%Pred-Pre: 78 %
FEV6-Post: 2.3 L
FEV6-Pre: 2.08 L
FEV6FVC-%Change-Post: 0 %
FEV6FVC-%Pred-Post: 104 %
FEV6FVC-%Pred-Pre: 103 %
FVC-%Change-Post: 9 %
FVC-%Pred-Post: 83 %
FVC-%Pred-Pre: 76 %
FVC-Post: 2.3 L
FVC-Pre: 2.09 L
Post FEV1/FVC ratio: 65 %
Post FEV6/FVC ratio: 100 %
Pre FEV1/FVC ratio: 62 %
Pre FEV6/FVC Ratio: 100 %
RV % pred: 163 %
RV: 3.02 L
TLC % pred: 118 %
TLC: 5.28 L

## 2022-09-17 NOTE — Progress Notes (Unsigned)
Subjective:   PATIENT ID: Jenna Rivera GENDER: female DOB: 1960/03/09, MRN: [id number]  Chief Complaint  Patient presents with   Follow-up    Reason for Visit: SOB  [name] is a 62 year old female former smoker with COPD, chronic diarrhea, HTN, OSA on CPAP who presents for follow-up  Initial consult She previously was seen by Dr. Sherene Sires with last visit 10/12/2018. She was diagnosed with COPD in 2015 requiring hospitalization, no vent. She is currently on Breo and uses albuterol 3 times a week. Needs rescue with activity with wheezing and cough. She reports shortness of breath triggered by extremes of hot and cold, dust and illness. Has chronic sinus drainage. May have had childhood asthma. Her last exacerbation 12/2021. She has intentionally lost over 100 lbs over the last two years. After weight her breathing has improved and has reduced her exacerbations to 1-2 times a year vs monthly. She is compliant with CPAP which helps with her quality of sleep. Has oxygen.  09/17/22 Since our last visit she and her husband had covid three weeks ago. She reports occasional shortness of breath and coughing when triggered by certain smells. Denies wheezing. Has been compliant with Breo and feels it has made a difference in her overall breathing. Rarely uses albuterol. Checks oxygen mid-90s. Does not use her oxygen anymore.  Social History: Quit smoking in 1993. 1 ppd x 20 years Disabled  Half sister with Candise Bowens, another patient  Past Medical History:  Diagnosis Date   Acute bronchitis with COPD (HCC)    Allergic rhinitis    Anxiety    Asthma    Cervical disc herniation    Chest pain    Chronic pain    COPD (chronic obstructive pulmonary disease) (HCC)    Edema    HTN (hypertension)    Hyperlipidemia    Lumbago    Neck pain    Osteoporosis    Reflux esophagitis    Rosacea    Sleep apnea    uses CPAP     Family History  Problem Relation Age of Onset    COPD Mother    Cancer Father    Breast cancer Sister    Lung cancer Sister    Multiple sclerosis Sister    Colon cancer Neg Hx        family history is limited.      Social History   Occupational History   Not on file  Tobacco Use   Smoking status: Former    Current packs/day: 0.00    Average packs/day: 1 pack/day for 15.0 years (15.0 ttl pk-yrs)    Types: Cigarettes    Start date: 01/05/1976    Quit date: 01/05/1991    Years since quitting: 31.7    Passive exposure: Past   Smokeless tobacco: Never  Vaping Use   Vaping status: Never Used  Substance and Sexual Activity   Alcohol use: Yes    Comment: occasional   Drug use: Never   Sexual activity: Not Currently    No Known Allergies   Outpatient Medications Prior to Visit  Medication Sig Dispense Refill   acetaminophen (TYLENOL) 500 MG tablet Take 500-1,000 mg by mouth every 6 (six) hours as needed (for pain.).     albuterol (PROVENTIL) (2.5 MG/3ML) 0.083% nebulizer solution Inhale 3 mLs into the lungs every 6 (six) hours as needed (wheezing/shortness of breath.).      Ascorbic Acid (VITAMIN C) 1000 MG tablet Take 1,000  mg by mouth in the morning.     Budeson-Glycopyrrol-Formoterol (BREZTRI AEROSPHERE) 160-9-4.8 MCG/ACT AERO Inhale 1 puff into the lungs in the morning and at bedtime.     calcium carbonate (TUMS - DOSED IN MG ELEMENTAL CALCIUM) 500 MG chewable tablet Chew 1 tablet by mouth 3 (three) times a week.     Cholecalciferol (VITAMIN D3) 50 MCG (2000 UT) TABS Take 2,000 Units by mouth in the morning.     colestipol (COLESTID) 1 g tablet Take 1 tablet (1 g total) by mouth 2 (two) times daily as needed (diarrhea). Do not take within two hours of other medications. 60 tablet 5   dicyclomine (BENTYL) 10 MG capsule TAKE 1 CAPSULE BY MOUTH FOUR TIMES DAILY - BEFORE MEALS AND AT BEDTIME 120 capsule 3   fluticasone (FLONASE) 50 MCG/ACT nasal spray Place 2 sprays into both nostrils daily as needed for allergies.     furosemide  (LASIX) 40 MG tablet Take 20-40 mg by mouth daily as needed (fluid retention.).      gabapentin (NEURONTIN) 300 MG capsule Take 300-600 mg by mouth See admin instructions. Take 1 capsule (300 mg) by mouth in the morning & take 2 capsules (600 mg) by mouth at night     HYDROcodone-acetaminophen (NORCO) 7.5-325 MG tablet Take 1 tablet by mouth every 6 (six) hours as needed (pain.).     ipratropium-albuterol (DUONEB) 0.5-2.5 (3) MG/3ML SOLN Take 3 mLs by nebulization every 4 (four) hours as needed (wheezing/shortness of breath.).      latanoprost (XALATAN) 0.005 % ophthalmic solution Place 1 drop into both eyes at bedtime.      LORazepam (ATIVAN) 1 MG tablet Take 1 mg by mouth 2 (two) times daily as needed for anxiety.      meloxicam (MOBIC) 15 MG tablet Take 15 mg by mouth daily.     montelukast (SINGULAIR) 10 MG tablet Take 10 mg by mouth at bedtime.     Multiple Vitamin (MULTIVITAMIN WITH MINERALS) TABS tablet Take 1 tablet by mouth in the morning.     pantoprazole (PROTONIX) 40 MG tablet Take 40 mg by mouth daily before breakfast.     polyethylene glycol-electrolytes (NULYTELY) 420 g solution As directed 4000 mL 0   tiZANidine (ZANAFLEX) 4 MG tablet Take 4 mg by mouth every 8 (eight) hours as needed for muscle spasms.     albuterol (VENTOLIN HFA) 108 (90 Base) MCG/ACT inhaler Inhale 1-2 puffs into the lungs every 4 (four) hours as needed for wheezing or shortness of breath. 3 each 2   lisinopril (ZESTRIL) 5 MG tablet Take 5 mg by mouth in the morning.     No facility-administered medications prior to visit.    Review of Systems  Constitutional:  Negative for chills, diaphoresis, fever, malaise/fatigue and weight loss.  HENT:  Negative for congestion.   Respiratory:  Positive for cough. Negative for hemoptysis, sputum production, shortness of breath and wheezing.   Cardiovascular:  Negative for chest pain, palpitations and leg swelling.     Objective:   Vitals:   09/17/22 1447  BP: (!)  146/60  Pulse: 72  Resp: 20  SpO2: 98%  Weight: 183 lb (83 kg)  Height: 5' (1.524 m)   SpO2: 98 %  Physical Exam: General: Well-appearing, no acute distress HENT: [place], AT Eyes: EOMI, no scleral icterus Respiratory: Clear to auscultation bilaterally.  No crackles, wheezing or rales Cardiovascular: RRR, -M/R/G, no JVD Extremities:-Edema,-tenderness Neuro: AAO x4, CNII-XII grossly intact Psych: Normal mood, normal affect  Data Reviewed:  Imaging: CXR 07/03/18 - Hyperinflated  PFT: 09/17/22 FVC 2.3 (83%) FEV1 1.5 (71%) Ratio 62  TLC 118% DLCO 137%.  Interpretation: Mild obstructive defect with air trapping and increased DLCO consistent with asthma   Labs: CBC No results found for: "WBC", "RBC", "HGB", "HCT", "PLT", "MCV", "MCH", "MCHC", "RDW", "LYMPHSABS", "MONOABS", "EOSABS", "BASOSABS"      Assessment & Plan:   Discussion: 62 year old female former smoker with COPD, chronic diarrhea, HTN, OSA on CPAP who presents for follow-up. Well controlled on Breo 200. Discussed clinical course and management of COPD including bronchodilator regimen, preventive care including vaccinations and action plan for exacerbation.  COPD-asthma overlap --Reviewed PFTs consistent with COPD-asthma overlap --CONTINUE Breo 200 mcg ONE puff ONCE a day. Samples per PCP --CONTINUE Albuterol AS NEEDED for shortness of breath or wheezing. ORDERED --Daytime oxygen only needed for <88%  Health Maintenance Immunization History  Administered Date(s) Administered   Influenza,inj,quad, With Preservative 09/08/2018   Moderna Sars-Covid-2 Vaccination 03/08/2019, 04/10/2019   Tdap 03/09/2018   CT Lung Screen - not qualified. Quit in 1993  No orders of the defined types were placed in this encounter.  No orders of the defined types were placed in this encounter.   Return in about 6 months (around 03/17/2023).  I have spent a total time of 30-minutes on the day of the appointment including chart  review, data review, collecting history, coordinating care and discussing medical diagnosis and plan with the patient/family. Past medical history, allergies, medications were reviewed. Pertinent imaging, labs and tests included in this note have been reviewed and interpreted independently by me.  Beth Goodlin Mechele Collin, MD [facility] 09/17/2022 3:37 PM  Office Number [phone]

## 2022-09-17 NOTE — Progress Notes (Signed)
Full PFT Performed Today  

## 2022-09-17 NOTE — Patient Instructions (Signed)
COPD-asthma overlap --Reviewed PFTs consistent with COPD-asthma overlap --CONTINUE Breo 200 mcg ONE puff ONCE a day. Samples per PCP --CONTINUE Albuterol AS NEEDED for shortness of breath or wheezing. ORDERED --Daytime oxygen only needed for <88%

## 2022-09-17 NOTE — Patient Instructions (Signed)
Full PFT Performed Today  

## 2022-09-20 ENCOUNTER — Encounter (HOSPITAL_BASED_OUTPATIENT_CLINIC_OR_DEPARTMENT_OTHER): Payer: Self-pay | Admitting: Pulmonary Disease

## 2022-09-21 DIAGNOSIS — Z23 Encounter for immunization: Secondary | ICD-10-CM | POA: Diagnosis not present

## 2022-09-21 DIAGNOSIS — M2550 Pain in unspecified joint: Secondary | ICD-10-CM | POA: Diagnosis not present

## 2022-09-21 DIAGNOSIS — I10 Essential (primary) hypertension: Secondary | ICD-10-CM | POA: Diagnosis not present

## 2022-09-21 DIAGNOSIS — B350 Tinea barbae and tinea capitis: Secondary | ICD-10-CM | POA: Diagnosis not present

## 2022-09-21 DIAGNOSIS — F419 Anxiety disorder, unspecified: Secondary | ICD-10-CM | POA: Diagnosis not present

## 2022-09-21 DIAGNOSIS — Z299 Encounter for prophylactic measures, unspecified: Secondary | ICD-10-CM | POA: Diagnosis not present

## 2022-09-23 DIAGNOSIS — M170 Bilateral primary osteoarthritis of knee: Secondary | ICD-10-CM | POA: Diagnosis not present

## 2022-10-06 DIAGNOSIS — D485 Neoplasm of uncertain behavior of skin: Secondary | ICD-10-CM | POA: Diagnosis not present

## 2022-10-06 DIAGNOSIS — Z85828 Personal history of other malignant neoplasm of skin: Secondary | ICD-10-CM | POA: Diagnosis not present

## 2022-10-06 DIAGNOSIS — D224 Melanocytic nevi of scalp and neck: Secondary | ICD-10-CM | POA: Diagnosis not present

## 2022-10-06 DIAGNOSIS — Z08 Encounter for follow-up examination after completed treatment for malignant neoplasm: Secondary | ICD-10-CM | POA: Diagnosis not present

## 2022-10-06 DIAGNOSIS — D225 Melanocytic nevi of trunk: Secondary | ICD-10-CM | POA: Diagnosis not present

## 2022-10-20 DIAGNOSIS — I10 Essential (primary) hypertension: Secondary | ICD-10-CM | POA: Diagnosis not present

## 2022-10-20 DIAGNOSIS — Z299 Encounter for prophylactic measures, unspecified: Secondary | ICD-10-CM | POA: Diagnosis not present

## 2022-10-20 DIAGNOSIS — F419 Anxiety disorder, unspecified: Secondary | ICD-10-CM | POA: Diagnosis not present

## 2022-10-20 DIAGNOSIS — M2550 Pain in unspecified joint: Secondary | ICD-10-CM | POA: Diagnosis not present

## 2022-10-20 DIAGNOSIS — R52 Pain, unspecified: Secondary | ICD-10-CM | POA: Diagnosis not present

## 2022-10-20 DIAGNOSIS — J309 Allergic rhinitis, unspecified: Secondary | ICD-10-CM | POA: Diagnosis not present

## 2022-11-19 DIAGNOSIS — J439 Emphysema, unspecified: Secondary | ICD-10-CM | POA: Diagnosis not present

## 2022-11-19 DIAGNOSIS — R52 Pain, unspecified: Secondary | ICD-10-CM | POA: Diagnosis not present

## 2022-11-19 DIAGNOSIS — M2550 Pain in unspecified joint: Secondary | ICD-10-CM | POA: Diagnosis not present

## 2022-11-19 DIAGNOSIS — Z299 Encounter for prophylactic measures, unspecified: Secondary | ICD-10-CM | POA: Diagnosis not present

## 2022-11-19 DIAGNOSIS — J9611 Chronic respiratory failure with hypoxia: Secondary | ICD-10-CM | POA: Diagnosis not present

## 2022-11-19 DIAGNOSIS — I10 Essential (primary) hypertension: Secondary | ICD-10-CM | POA: Diagnosis not present

## 2022-12-17 DIAGNOSIS — Z299 Encounter for prophylactic measures, unspecified: Secondary | ICD-10-CM | POA: Diagnosis not present

## 2022-12-17 DIAGNOSIS — M2550 Pain in unspecified joint: Secondary | ICD-10-CM | POA: Diagnosis not present

## 2022-12-17 DIAGNOSIS — I10 Essential (primary) hypertension: Secondary | ICD-10-CM | POA: Diagnosis not present

## 2022-12-17 DIAGNOSIS — L089 Local infection of the skin and subcutaneous tissue, unspecified: Secondary | ICD-10-CM | POA: Diagnosis not present

## 2022-12-17 DIAGNOSIS — R0981 Nasal congestion: Secondary | ICD-10-CM | POA: Diagnosis not present

## 2022-12-23 DIAGNOSIS — M170 Bilateral primary osteoarthritis of knee: Secondary | ICD-10-CM | POA: Diagnosis not present

## 2023-01-11 DIAGNOSIS — G4733 Obstructive sleep apnea (adult) (pediatric): Secondary | ICD-10-CM | POA: Diagnosis not present

## 2023-01-14 DIAGNOSIS — I10 Essential (primary) hypertension: Secondary | ICD-10-CM | POA: Diagnosis not present

## 2023-01-14 DIAGNOSIS — M2550 Pain in unspecified joint: Secondary | ICD-10-CM | POA: Diagnosis not present

## 2023-01-14 DIAGNOSIS — J9611 Chronic respiratory failure with hypoxia: Secondary | ICD-10-CM | POA: Diagnosis not present

## 2023-01-14 DIAGNOSIS — E65 Localized adiposity: Secondary | ICD-10-CM | POA: Diagnosis not present

## 2023-01-14 DIAGNOSIS — Z299 Encounter for prophylactic measures, unspecified: Secondary | ICD-10-CM | POA: Diagnosis not present

## 2023-01-14 DIAGNOSIS — B350 Tinea barbae and tinea capitis: Secondary | ICD-10-CM | POA: Diagnosis not present

## 2023-01-26 DIAGNOSIS — D225 Melanocytic nevi of trunk: Secondary | ICD-10-CM | POA: Diagnosis not present

## 2023-01-26 DIAGNOSIS — Z85828 Personal history of other malignant neoplasm of skin: Secondary | ICD-10-CM | POA: Diagnosis not present

## 2023-01-26 DIAGNOSIS — Z1283 Encounter for screening for malignant neoplasm of skin: Secondary | ICD-10-CM | POA: Diagnosis not present

## 2023-01-26 DIAGNOSIS — L304 Erythema intertrigo: Secondary | ICD-10-CM | POA: Diagnosis not present

## 2023-01-26 DIAGNOSIS — Z08 Encounter for follow-up examination after completed treatment for malignant neoplasm: Secondary | ICD-10-CM | POA: Diagnosis not present

## 2023-02-17 DIAGNOSIS — I10 Essential (primary) hypertension: Secondary | ICD-10-CM | POA: Diagnosis not present

## 2023-02-17 DIAGNOSIS — R0981 Nasal congestion: Secondary | ICD-10-CM | POA: Diagnosis not present

## 2023-02-17 DIAGNOSIS — M2550 Pain in unspecified joint: Secondary | ICD-10-CM | POA: Diagnosis not present

## 2023-02-17 DIAGNOSIS — Z299 Encounter for prophylactic measures, unspecified: Secondary | ICD-10-CM | POA: Diagnosis not present

## 2023-02-17 DIAGNOSIS — Z79899 Other long term (current) drug therapy: Secondary | ICD-10-CM | POA: Diagnosis not present

## 2023-02-17 DIAGNOSIS — J329 Chronic sinusitis, unspecified: Secondary | ICD-10-CM | POA: Diagnosis not present

## 2023-02-17 DIAGNOSIS — J439 Emphysema, unspecified: Secondary | ICD-10-CM | POA: Diagnosis not present

## 2023-02-17 DIAGNOSIS — R059 Cough, unspecified: Secondary | ICD-10-CM | POA: Diagnosis not present

## 2023-03-18 DIAGNOSIS — I10 Essential (primary) hypertension: Secondary | ICD-10-CM | POA: Diagnosis not present

## 2023-03-18 DIAGNOSIS — J9611 Chronic respiratory failure with hypoxia: Secondary | ICD-10-CM | POA: Diagnosis not present

## 2023-03-18 DIAGNOSIS — M25472 Effusion, left ankle: Secondary | ICD-10-CM | POA: Diagnosis not present

## 2023-03-18 DIAGNOSIS — Z299 Encounter for prophylactic measures, unspecified: Secondary | ICD-10-CM | POA: Diagnosis not present

## 2023-03-18 DIAGNOSIS — F419 Anxiety disorder, unspecified: Secondary | ICD-10-CM | POA: Diagnosis not present

## 2023-03-22 ENCOUNTER — Ambulatory Visit (HOSPITAL_BASED_OUTPATIENT_CLINIC_OR_DEPARTMENT_OTHER): Payer: PPO | Admitting: Pulmonary Disease

## 2023-03-22 DIAGNOSIS — M170 Bilateral primary osteoarthritis of knee: Secondary | ICD-10-CM | POA: Diagnosis not present

## 2023-03-22 DIAGNOSIS — M2392 Unspecified internal derangement of left knee: Secondary | ICD-10-CM | POA: Diagnosis not present

## 2023-03-31 ENCOUNTER — Other Ambulatory Visit: Payer: Self-pay | Admitting: Gastroenterology

## 2023-03-31 DIAGNOSIS — R197 Diarrhea, unspecified: Secondary | ICD-10-CM

## 2023-03-31 NOTE — Telephone Encounter (Signed)
Patient needs ov for refills.

## 2023-04-11 ENCOUNTER — Ambulatory Visit: Admitting: Gastroenterology

## 2023-04-18 DIAGNOSIS — J9611 Chronic respiratory failure with hypoxia: Secondary | ICD-10-CM | POA: Diagnosis not present

## 2023-04-18 DIAGNOSIS — I10 Essential (primary) hypertension: Secondary | ICD-10-CM | POA: Diagnosis not present

## 2023-04-18 DIAGNOSIS — Z299 Encounter for prophylactic measures, unspecified: Secondary | ICD-10-CM | POA: Diagnosis not present

## 2023-04-18 DIAGNOSIS — L299 Pruritus, unspecified: Secondary | ICD-10-CM | POA: Diagnosis not present

## 2023-04-18 DIAGNOSIS — K219 Gastro-esophageal reflux disease without esophagitis: Secondary | ICD-10-CM | POA: Diagnosis not present

## 2023-04-18 DIAGNOSIS — G4733 Obstructive sleep apnea (adult) (pediatric): Secondary | ICD-10-CM | POA: Diagnosis not present

## 2023-04-18 DIAGNOSIS — J439 Emphysema, unspecified: Secondary | ICD-10-CM | POA: Diagnosis not present

## 2023-04-28 ENCOUNTER — Ambulatory Visit (HOSPITAL_BASED_OUTPATIENT_CLINIC_OR_DEPARTMENT_OTHER): Admitting: Pulmonary Disease

## 2023-04-28 ENCOUNTER — Encounter (HOSPITAL_BASED_OUTPATIENT_CLINIC_OR_DEPARTMENT_OTHER): Payer: Self-pay | Admitting: Pulmonary Disease

## 2023-04-28 VITALS — BP 128/70 | HR 57 | Ht 60.0 in | Wt 199.1 lb

## 2023-04-28 DIAGNOSIS — J449 Chronic obstructive pulmonary disease, unspecified: Secondary | ICD-10-CM

## 2023-04-28 DIAGNOSIS — Z7722 Contact with and (suspected) exposure to environmental tobacco smoke (acute) (chronic): Secondary | ICD-10-CM

## 2023-04-28 DIAGNOSIS — Z87891 Personal history of nicotine dependence: Secondary | ICD-10-CM

## 2023-04-28 DIAGNOSIS — J4489 Other specified chronic obstructive pulmonary disease: Secondary | ICD-10-CM

## 2023-04-28 MED ORDER — ALBUTEROL SULFATE HFA 108 (90 BASE) MCG/ACT IN AERS: 1.0000 | INHALATION_SPRAY | Freq: Four times a day (QID) | RESPIRATORY_TRACT | 5 refills | Status: AC | PRN

## 2023-04-28 NOTE — Patient Instructions (Signed)
 COPD-asthma overlap --STOP maintenance inhalers --CONTINUE Albuterol  AS NEEDED for shortness of breath or wheezing. ORDERED --Daytime oxygen only needed for <88%. Currently not needing O2. Owns POC at home --ORDER pulmonary function tests. If worsening obstruction, will need to consider restarting maintenance inhalers

## 2023-04-28 NOTE — Progress Notes (Signed)
 Subjective:   PATIENT ID: Jenna Rivera GENDER: female DOB: [birth year], MRN: [id number]  Chief Complaint  Patient presents with   Follow-up    COPD    Reason for Visit: SOB  [name] is a 63 year old female former smoker with COPD, chronic diarrhea, HTN, OSA on CPAP who presents for follow-up  Initial consult She previously was seen by Dr. Waymond Hailey with last visit 10/12/2018. She was diagnosed with COPD in 2015 requiring hospitalization, no vent. She is currently on Breo and uses albuterol  3 times a week. Needs rescue with activity with wheezing and cough. She reports shortness of breath triggered by extremes of hot and cold, dust and illness. Has chronic sinus drainage. May have had childhood asthma. Her last exacerbation 12/2021. She has intentionally lost over 100 lbs over the last two years. After weight her breathing has improved and has reduced her exacerbations to 1-2 times a year vs monthly. She is compliant with CPAP which helps with her quality of sleep. Has oxygen.  09/17/22 Since our last visit she and her husband had covid three weeks ago. She reports occasional shortness of breath and coughing when triggered by certain smells. Denies wheezing. Has been compliant with Breo and feels it has made a difference in her overall breathing. Rarely uses albuterol . Checks oxygen mid-90s. Does not use her oxygen anymore.  04/28/23 Since our last visit she reports she is overall doing well. She is taking Breo twice a week. Denies shortness of breath, cough or wheezing. No limitations in activity including walking upstairs. Denies exacerbations since our last visit. Does get triggered by strong odors, smoke and illness.  Social History: Quit smoking in 1993. 1 ppd x 20 years Disabled  Half sister with Deatrice Factor, another patient  Past Medical History:  Diagnosis Date   Acute bronchitis with COPD (HCC)    Allergic rhinitis    Anxiety    Asthma    Cervical disc  herniation    Chest pain    Chronic pain    COPD (chronic obstructive pulmonary disease) (HCC)    Edema    HTN (hypertension)    Hyperlipidemia    Lumbago    Neck pain    Osteoporosis    Reflux esophagitis    Rosacea    Sleep apnea    uses CPAP     Family History  Problem Relation Age of Onset   COPD Mother    Cancer Father    Breast cancer Sister    Lung cancer Sister    Multiple sclerosis Sister    Colon cancer Neg Hx        family history is limited.      Social History   Occupational History   Not on file  Tobacco Use   Smoking status: Former    Current packs/day: 0.00    Average packs/day: 1 pack/day for 15.0 years (15.0 ttl pk-yrs)    Types: Cigarettes    Start date: 01/05/1976    Quit date: 01/05/1991    Years since quitting: 32.3    Passive exposure: Past   Smokeless tobacco: Never  Vaping Use   Vaping status: Never Used  Substance and Sexual Activity   Alcohol  use: Yes    Comment: occasional   Drug use: Never   Sexual activity: Not Currently    No Known Allergies   Outpatient Medications Prior to Visit  Medication Sig Dispense Refill   albuterol  (PROVENTIL ) (2.5 MG/3ML) 0.083%  nebulizer solution Inhale 3 mLs into the lungs every 6 (six) hours as needed (wheezing/shortness of breath.).      Ascorbic Acid (VITAMIN C) 1000 MG tablet Take 1,000 mg by mouth in the morning.     calcium carbonate (TUMS - DOSED IN MG ELEMENTAL CALCIUM) 500 MG chewable tablet Chew 1 tablet by mouth 3 (three) times a week.     Cholecalciferol (VITAMIN D3) 50 MCG (2000 UT) TABS Take 2,000 Units by mouth in the morning.     dicyclomine  (BENTYL ) 10 MG capsule TAKE 1 CAPSULE BY MOUTH FOUR TIMES DAILY - BEFORE MEALS AND AT BEDTIME 120 capsule 0   fluticasone (FLONASE) 50 MCG/ACT nasal spray Place 2 sprays into both nostrils daily as needed for allergies.     furosemide (LASIX) 40 MG tablet Take 20-40 mg by mouth daily as needed (fluid retention.).      gabapentin (NEURONTIN) 300 MG  capsule Take 300-600 mg by mouth See admin instructions. Take 1 capsule (300 mg) by mouth in the morning & take 2 capsules (600 mg) by mouth at night     HYDROcodone-acetaminophen  (NORCO) 7.5-325 MG tablet Take 1 tablet by mouth every 6 (six) hours as needed (pain.).     ipratropium-albuterol  (DUONEB) 0.5-2.5 (3) MG/3ML SOLN Take 3 mLs by nebulization every 4 (four) hours as needed (wheezing/shortness of breath.).      latanoprost (XALATAN) 0.005 % ophthalmic solution Place 1 drop into both eyes at bedtime.      LORazepam (ATIVAN) 1 MG tablet Take 1 mg by mouth 2 (two) times daily as needed for anxiety.      meloxicam (MOBIC) 15 MG tablet Take 15 mg by mouth daily.     montelukast (SINGULAIR) 10 MG tablet Take 10 mg by mouth at bedtime.     Multiple Vitamin (MULTIVITAMIN WITH MINERALS) TABS tablet Take 1 tablet by mouth in the morning.     pantoprazole (PROTONIX) 40 MG tablet Take 40 mg by mouth daily before breakfast.     polyethylene glycol-electrolytes (NULYTELY) 420 g solution As directed 4000 mL 0   tiZANidine (ZANAFLEX) 4 MG tablet Take 4 mg by mouth every 8 (eight) hours as needed for muscle spasms.     acetaminophen  (TYLENOL ) 500 MG tablet Take 500-1,000 mg by mouth every 6 (six) hours as needed (for pain.). (Patient not taking: Reported on 04/28/2023)     colestipol  (COLESTID ) 1 g tablet Take 1 tablet (1 g total) by mouth 2 (two) times daily as needed (diarrhea). Do not take within two hours of other medications. (Patient not taking: Reported on 04/28/2023) 60 tablet 5   albuterol  (VENTOLIN  HFA) 108 (90 Base) MCG/ACT inhaler Inhale 1-2 puffs into the lungs every 4 (four) hours as needed for wheezing or shortness of breath. 3 each 2   Budeson-Glycopyrrol-Formoterol (BREZTRI AEROSPHERE) 160-9-4.8 MCG/ACT AERO Inhale 1 puff into the lungs in the morning and at bedtime. (Patient not taking: Reported on 04/28/2023)     No facility-administered medications prior to visit.    Review of Systems   Constitutional:  Negative for chills, diaphoresis, fever, malaise/fatigue and weight loss.  HENT:  Negative for congestion.   Respiratory:  Negative for cough, hemoptysis, sputum production, shortness of breath and wheezing.   Cardiovascular:  Negative for chest pain, palpitations and leg swelling.     Objective:   Vitals:   04/28/23 1020  BP: 128/70  Pulse: (!) 57  SpO2: 97%  Weight: 199 lb 1.6 oz (90.3 kg)  Height: 5' (  1.524 m)   SpO2: 97 %  Physical Exam: General: Well-appearing, no acute distress HENT: [place], AT Eyes: EOMI, no scleral icterus Respiratory: Clear to auscultation bilaterally.  No crackles, wheezing or rales Cardiovascular: RRR, -M/R/G, no JVD Extremities:-Edema,-tenderness Neuro: AAO x4, CNII-XII grossly intact Psych: Normal mood, normal affect  Data Reviewed:  Imaging: CXR 07/03/18 - Hyperinflated  PFT: 09/17/22 FVC 2.3 (83%) FEV1 1.5 (71%) Ratio 62  TLC 118% DLCO 137%.  Interpretation: Mild obstructive defect with air trapping and increased DLCO consistent with asthma   Labs: CBC No results found for: "WBC", "RBC", "HGB", "HCT", "PLT", "MCV", "MCH", "MCHC", "RDW", "LYMPHSABS", "MONOABS", "EOSABS", "BASOSABS"      Assessment & Plan:   Discussion: 63 year old female former smoker with COPD, chronic diarrhea, HTN, OSA on CPAP who presents for follow-up. PFTs consistent with COPD-asthma overlap. Well controlled off inhalers. Discussed clinical course and management of COPD including bronchodilator regimen, preventive care including vaccinations and action plan for exacerbation.  COPD-asthma overlap --STOP maintenance inhalers --CONTINUE Albuterol  AS NEEDED for shortness of breath or wheezing. ORDERED --Daytime oxygen only needed for <88%. Currently not needing O2. Owns POC at home --ORDER pulmonary function tests. If worsening obstruction, will need to consider restarting maintenance inhalers  Health Maintenance Immunization History   Administered Date(s) Administered   Influenza,inj,quad, With Preservative 09/08/2018   Moderna Sars-Covid-2 Vaccination 03/08/2019, 04/10/2019   Tdap 03/09/2018   CT Lung Screen - not qualified. Quit in 1993  Orders Placed This Encounter  Procedures   Pulmonary function test    Standing Status:   Future    Expiration Date:   04/27/2024    Where should this test be performed?:   Outpatient [facility]    What type of PFT is being ordered?:   Full PFT   Meds ordered this encounter  Medications   albuterol  (VENTOLIN  HFA) 108 (90 Base) MCG/ACT inhaler    Sig: Inhale 1-2 puffs into the lungs every 6 (six) hours as needed for wheezing or shortness of breath.    Dispense:  8 g    Refill:  5    Ok to substitute for insurance preferred    No follow-ups on file.  I have spent a total time of 33-minutes on the day of the appointment including chart review, data review, collecting history, coordinating care and discussing medical diagnosis and plan with the patient/family. Past medical history, allergies, medications were reviewed. Pertinent imaging, labs and tests included in this note have been reviewed and interpreted independently by me.  Nephtali Docken Genetta Kenning, MD [facility] 04/28/2023 10:56 AM

## 2023-05-03 ENCOUNTER — Other Ambulatory Visit: Payer: Self-pay | Admitting: Family Medicine

## 2023-05-03 DIAGNOSIS — Z1231 Encounter for screening mammogram for malignant neoplasm of breast: Secondary | ICD-10-CM

## 2023-05-16 DIAGNOSIS — J439 Emphysema, unspecified: Secondary | ICD-10-CM | POA: Diagnosis not present

## 2023-05-16 DIAGNOSIS — I10 Essential (primary) hypertension: Secondary | ICD-10-CM | POA: Diagnosis not present

## 2023-05-16 DIAGNOSIS — M2550 Pain in unspecified joint: Secondary | ICD-10-CM | POA: Diagnosis not present

## 2023-05-16 DIAGNOSIS — L0291 Cutaneous abscess, unspecified: Secondary | ICD-10-CM | POA: Diagnosis not present

## 2023-05-16 DIAGNOSIS — Z299 Encounter for prophylactic measures, unspecified: Secondary | ICD-10-CM | POA: Diagnosis not present

## 2023-05-16 DIAGNOSIS — F419 Anxiety disorder, unspecified: Secondary | ICD-10-CM | POA: Diagnosis not present

## 2023-06-07 ENCOUNTER — Ambulatory Visit

## 2023-06-14 ENCOUNTER — Other Ambulatory Visit: Payer: Self-pay | Admitting: Gastroenterology

## 2023-06-14 DIAGNOSIS — R197 Diarrhea, unspecified: Secondary | ICD-10-CM

## 2023-06-14 NOTE — Telephone Encounter (Signed)
 She needs ov. Cancelled April visit and last seen two years ago. One refill provided.

## 2023-06-15 NOTE — Telephone Encounter (Signed)
 Pt scheduled

## 2023-06-16 DIAGNOSIS — M2392 Unspecified internal derangement of left knee: Secondary | ICD-10-CM | POA: Diagnosis not present

## 2023-06-16 DIAGNOSIS — M170 Bilateral primary osteoarthritis of knee: Secondary | ICD-10-CM | POA: Diagnosis not present

## 2023-06-16 DIAGNOSIS — Z299 Encounter for prophylactic measures, unspecified: Secondary | ICD-10-CM | POA: Diagnosis not present

## 2023-06-16 DIAGNOSIS — I10 Essential (primary) hypertension: Secondary | ICD-10-CM | POA: Diagnosis not present

## 2023-06-16 DIAGNOSIS — M2550 Pain in unspecified joint: Secondary | ICD-10-CM | POA: Diagnosis not present

## 2023-06-16 DIAGNOSIS — J9611 Chronic respiratory failure with hypoxia: Secondary | ICD-10-CM | POA: Diagnosis not present

## 2023-06-16 DIAGNOSIS — M1711 Unilateral primary osteoarthritis, right knee: Secondary | ICD-10-CM | POA: Diagnosis not present

## 2023-06-16 DIAGNOSIS — M25551 Pain in right hip: Secondary | ICD-10-CM | POA: Diagnosis not present

## 2023-06-22 ENCOUNTER — Other Ambulatory Visit: Payer: Self-pay | Admitting: Gastroenterology

## 2023-06-22 DIAGNOSIS — G4733 Obstructive sleep apnea (adult) (pediatric): Secondary | ICD-10-CM | POA: Diagnosis not present

## 2023-06-22 DIAGNOSIS — R197 Diarrhea, unspecified: Secondary | ICD-10-CM

## 2023-06-28 DIAGNOSIS — Z79899 Other long term (current) drug therapy: Secondary | ICD-10-CM | POA: Diagnosis not present

## 2023-06-28 DIAGNOSIS — Z6837 Body mass index (BMI) 37.0-37.9, adult: Secondary | ICD-10-CM | POA: Diagnosis not present

## 2023-06-28 DIAGNOSIS — Z90710 Acquired absence of both cervix and uterus: Secondary | ICD-10-CM | POA: Diagnosis not present

## 2023-06-28 DIAGNOSIS — J9611 Chronic respiratory failure with hypoxia: Secondary | ICD-10-CM | POA: Diagnosis not present

## 2023-06-28 DIAGNOSIS — R6510 Systemic inflammatory response syndrome (SIRS) of non-infectious origin without acute organ dysfunction: Secondary | ICD-10-CM | POA: Diagnosis not present

## 2023-06-28 DIAGNOSIS — R918 Other nonspecific abnormal finding of lung field: Secondary | ICD-10-CM | POA: Diagnosis not present

## 2023-06-28 DIAGNOSIS — A419 Sepsis, unspecified organism: Secondary | ICD-10-CM | POA: Diagnosis not present

## 2023-06-28 DIAGNOSIS — Z20822 Contact with and (suspected) exposure to covid-19: Secondary | ICD-10-CM | POA: Diagnosis not present

## 2023-06-28 DIAGNOSIS — J189 Pneumonia, unspecified organism: Secondary | ICD-10-CM | POA: Diagnosis not present

## 2023-06-28 DIAGNOSIS — R52 Pain, unspecified: Secondary | ICD-10-CM | POA: Diagnosis not present

## 2023-06-28 DIAGNOSIS — Z299 Encounter for prophylactic measures, unspecified: Secondary | ICD-10-CM | POA: Diagnosis not present

## 2023-06-28 DIAGNOSIS — J90 Pleural effusion, not elsewhere classified: Secondary | ICD-10-CM | POA: Diagnosis not present

## 2023-06-28 DIAGNOSIS — R519 Headache, unspecified: Secondary | ICD-10-CM | POA: Diagnosis not present

## 2023-06-28 DIAGNOSIS — I10 Essential (primary) hypertension: Secondary | ICD-10-CM | POA: Diagnosis not present

## 2023-06-28 DIAGNOSIS — R059 Cough, unspecified: Secondary | ICD-10-CM | POA: Diagnosis not present

## 2023-06-28 DIAGNOSIS — Z87891 Personal history of nicotine dependence: Secondary | ICD-10-CM | POA: Diagnosis not present

## 2023-06-28 DIAGNOSIS — J441 Chronic obstructive pulmonary disease with (acute) exacerbation: Secondary | ICD-10-CM | POA: Diagnosis not present

## 2023-06-28 DIAGNOSIS — J439 Emphysema, unspecified: Secondary | ICD-10-CM | POA: Diagnosis not present

## 2023-06-29 DIAGNOSIS — I10 Essential (primary) hypertension: Secondary | ICD-10-CM | POA: Diagnosis not present

## 2023-06-29 DIAGNOSIS — J189 Pneumonia, unspecified organism: Secondary | ICD-10-CM | POA: Diagnosis not present

## 2023-06-29 DIAGNOSIS — Z79899 Other long term (current) drug therapy: Secondary | ICD-10-CM | POA: Diagnosis not present

## 2023-06-29 DIAGNOSIS — M069 Rheumatoid arthritis, unspecified: Secondary | ICD-10-CM | POA: Diagnosis not present

## 2023-06-29 DIAGNOSIS — A419 Sepsis, unspecified organism: Secondary | ICD-10-CM | POA: Diagnosis not present

## 2023-06-29 DIAGNOSIS — K219 Gastro-esophageal reflux disease without esophagitis: Secondary | ICD-10-CM | POA: Diagnosis not present

## 2023-06-29 DIAGNOSIS — J449 Chronic obstructive pulmonary disease, unspecified: Secondary | ICD-10-CM | POA: Diagnosis not present

## 2023-06-29 DIAGNOSIS — Z792 Long term (current) use of antibiotics: Secondary | ICD-10-CM | POA: Diagnosis not present

## 2023-07-07 DIAGNOSIS — M25551 Pain in right hip: Secondary | ICD-10-CM | POA: Diagnosis not present

## 2023-07-07 DIAGNOSIS — M170 Bilateral primary osteoarthritis of knee: Secondary | ICD-10-CM | POA: Diagnosis not present

## 2023-07-07 DIAGNOSIS — M1712 Unilateral primary osteoarthritis, left knee: Secondary | ICD-10-CM | POA: Diagnosis not present

## 2023-07-07 DIAGNOSIS — M47816 Spondylosis without myelopathy or radiculopathy, lumbar region: Secondary | ICD-10-CM | POA: Diagnosis not present

## 2023-07-07 DIAGNOSIS — M2392 Unspecified internal derangement of left knee: Secondary | ICD-10-CM | POA: Diagnosis not present

## 2023-07-15 DIAGNOSIS — J9611 Chronic respiratory failure with hypoxia: Secondary | ICD-10-CM | POA: Diagnosis not present

## 2023-07-15 DIAGNOSIS — M2550 Pain in unspecified joint: Secondary | ICD-10-CM | POA: Diagnosis not present

## 2023-07-15 DIAGNOSIS — I10 Essential (primary) hypertension: Secondary | ICD-10-CM | POA: Diagnosis not present

## 2023-07-15 DIAGNOSIS — Z299 Encounter for prophylactic measures, unspecified: Secondary | ICD-10-CM | POA: Diagnosis not present

## 2023-07-15 DIAGNOSIS — R110 Nausea: Secondary | ICD-10-CM | POA: Diagnosis not present

## 2023-07-18 NOTE — Progress Notes (Deleted)
 GI Office Note    Referring Provider: [name], FNP Primary Care Physician:  Jenna Waddell NOVAK, FNP  Primary Gastroenterologist:Jenna Shaaron, MD   Chief Complaint   No chief complaint on file.   History of Present Illness   Jenna Rivera is a 63 y.o. female presenting today for follow up for refills for dicyclomine .  Last seen in 05/2021. History of chronic diarrhea since cholecystectomy in the 90s. EGD August 2021 with normal esophagus s/p empiric dilation, numerous gastric erosions in the antrum and gastric body with biopsies negative for H. pylori, bulbar erosions.  Colonoscopy in 2014, by Dr. Donnel, sigmoid diverticulosis.   Colonoscopy 06/2021: -diverticulosis -nonbleeding internal hemorrhoids -random colon biopsies negative -repeat colonoscopy 10 years     Medications   Current Outpatient Medications  Medication Sig Dispense Refill   acetaminophen  (TYLENOL ) 500 MG tablet Take 500-1,000 mg by mouth every 6 (six) hours as needed (for pain.). (Patient not taking: Reported on 04/28/2023)     albuterol  (PROVENTIL ) (2.5 MG/3ML) 0.083% nebulizer solution Inhale 3 mLs into the lungs every 6 (six) hours as needed (wheezing/shortness of breath.).      albuterol  (VENTOLIN  HFA) 108 (90 Base) MCG/ACT inhaler Inhale 1-2 puffs into the lungs every 6 (six) hours as needed for wheezing or shortness of breath. 8 g 5   Ascorbic Acid (VITAMIN C) 1000 MG tablet Take 1,000 mg by mouth in the morning.     calcium carbonate (TUMS - DOSED IN MG ELEMENTAL CALCIUM) 500 MG chewable tablet Chew 1 tablet by mouth 3 (three) times a week.     Cholecalciferol (VITAMIN D3) 50 MCG (2000 UT) TABS Take 2,000 Units by mouth in the morning.     colestipol  (COLESTID ) 1 g tablet Take 1 tablet (1 g total) by mouth 2 (two) times daily as needed (diarrhea). Do not take within two hours of other medications. (Patient not taking: Reported on 04/28/2023) 60 tablet 5   dicyclomine  (BENTYL ) 10 MG  capsule TAKE 1 CAPSULE BY MOUTH 4 TIMES A DAY BEFORE MEALS AND AT BEDTIME 120 capsule 0   fluticasone (FLONASE) 50 MCG/ACT nasal spray Place 2 sprays into both nostrils daily as needed for allergies.     furosemide (LASIX) 40 MG tablet Take 20-40 mg by mouth daily as needed (fluid retention.).      gabapentin (NEURONTIN) 300 MG capsule Take 300-600 mg by mouth See admin instructions. Take 1 capsule (300 mg) by mouth in the morning & take 2 capsules (600 mg) by mouth at night     HYDROcodone-acetaminophen  (NORCO) 7.5-325 MG tablet Take 1 tablet by mouth every 6 (six) hours as needed (pain.).     ipratropium-albuterol  (DUONEB) 0.5-2.5 (3) MG/3ML SOLN Take 3 mLs by nebulization every 4 (four) hours as needed (wheezing/shortness of breath.).      latanoprost (XALATAN) 0.005 % ophthalmic solution Place 1 drop into both eyes at bedtime.      LORazepam (ATIVAN) 1 MG tablet Take 1 mg by mouth 2 (two) times daily as needed for anxiety.      meloxicam (MOBIC) 15 MG tablet Take 15 mg by mouth daily.     montelukast (SINGULAIR) 10 MG tablet Take 10 mg by mouth at bedtime.     Multiple Vitamin (MULTIVITAMIN WITH MINERALS) TABS tablet Take 1 tablet by mouth in the morning.     pantoprazole (PROTONIX) 40 MG tablet Take 40 mg by mouth daily before breakfast.     polyethylene glycol-electrolytes (NULYTELY) 420  g solution As directed 4000 mL 0   tiZANidine (ZANAFLEX) 4 MG tablet Take 4 mg by mouth every 8 (eight) hours as needed for muscle spasms.     No current facility-administered medications for this visit.    Allergies   Allergies as of 07/19/2023   (No Known Allergies)     Past Medical History   Past Medical History:  Diagnosis Date   Acute bronchitis with COPD (HCC)    Allergic rhinitis    Anxiety    Asthma    Cervical disc herniation    Chest pain    Chronic pain    COPD (chronic obstructive pulmonary disease) (HCC)    Edema    HTN (hypertension)    Hyperlipidemia    Lumbago    Neck  pain    Osteoporosis    Reflux esophagitis    Rosacea    Sleep apnea    uses CPAP    Past Surgical History   Past Surgical History:  Procedure Laterality Date   ABDOMINAL HYSTERECTOMY     BIOPSY  06/18/2021   Procedure: BIOPSY;  Surgeon: Rivera Lamar HERO, MD;  Location: [facility];  Service: Endoscopy;;   CHOLECYSTECTOMY     1990s   COLONOSCOPY  12/05/2012   [place]; Dr. Donnel; sigmoid diverticulosis, otherwise normal exam.  Recommended repeat in 10 years.   COLONOSCOPY WITH PROPOFOL  N/A 06/18/2021   Procedure: COLONOSCOPY WITH PROPOFOL ;  Surgeon: Rivera Lamar HERO, MD;  Location: [facility];  Service: Endoscopy;  Laterality: N/A;  2:45pm, random colon bx   ESOPHAGOGASTRODUODENOSCOPY (EGD) WITH PROPOFOL  N/A 08/13/2019   Procedure: ESOPHAGOGASTRODUODENOSCOPY (EGD) WITH PROPOFOL ;  Surgeon: Rivera Lamar HERO, MD;  Normal examined esophagus s/p dilations, numerous gastric erosions in the antrum and gastric body s/p biopsy, patent pylorus, bulbar erosions, otherwise D1 and D2 appeared normal. Pathology with gastric antral and oxyntic mucosa, negative for H. pylori.    MALONEY DILATION N/A 08/13/2019   Procedure: AGAPITO DILATION;  Surgeon: Rivera Lamar HERO, MD;  Location: [facility];  Service: Endoscopy;  Laterality: N/A;   MOUTH SURGERY     TUBAL LIGATION      Past Family History   Family History  Problem Relation Age of Onset   COPD Mother    Cancer Father    Breast cancer Sister    Lung cancer Sister    Multiple sclerosis Sister    Colon cancer Neg Hx        family history is limited.     Past Social History   Social History   Socioeconomic History   Marital status: Married    Spouse name: Not on file   Number of children: Not on file   Years of education: Not on file   Highest education level: Not on file  Occupational History   Not on file  Tobacco Use   Smoking status: Former    Current packs/day: 0.00    Average packs/day: 1 pack/day for 15.0 years (15.0 ttl  pk-yrs)    Types: Cigarettes    Start date: 01/05/1976    Quit date: 01/05/1991    Years since quitting: 32.5    Passive exposure: Past   Smokeless tobacco: Never  Vaping Use   Vaping status: Never Used  Substance and Sexual Activity   Alcohol  use: Yes    Comment: occasional   Drug use: Never   Sexual activity: Not Currently  Other Topics Concern   Not on file  Social History Narrative  Not on file   Social Drivers of Health   Financial Resource Strain: High Risk (06/28/2023)   Received from [facility]   Overall Financial Resource Strain (CARDIA)    How hard is it for you to pay for the very basics like food, housing, medical care, and heating?: Very hard  Food Insecurity: No Food Insecurity (06/29/2023)   Received from [facility]   Hunger Vital Sign    Within the past 12 months, you worried that your food would run out before you got the money to buy more.: Never true    Within the past 12 months, the food you bought just didn't last and you didn't have money to get more.: Never true  Transportation Needs: No Transportation Needs (06/29/2023)   Received from [facility] - Transportation    Lack of Transportation (Medical): No    Lack of Transportation (Non-Medical): No  Physical Activity: Sufficiently Active (06/28/2023)   Received from [facility]   Exercise Vital Sign    On average, how many days per week do you engage in moderate to strenuous exercise (like a brisk walk)?: 5 days    On average, how many minutes do you engage in exercise at this level?: 60 min  Stress: Stress Concern Present (06/28/2023)   Received from [facility] - Occupational Stress Questionnaire    Do you feel stress - tense, restless, nervous, or anxious, or unable to sleep at night because your mind is troubled all the time - these days?: Very much  Social Connections: Moderately Integrated (06/28/2023)   Received from [facility]   Social Connection and Isolation Panel    In a typical week, how many times do you talk on the phone with family, friends, or neighbors?: More than three times a week    How often do you get together with friends or relatives?: More than three times a week    How often do you attend church or religious services?: More than 4 times per year    Do you belong to any clubs or organizations such as church groups, unions, fraternal or athletic groups, or school groups?: No    How often do you attend meetings of the clubs or organizations you belong to?: Never    Are you married, widowed, divorced, separated, never married, or living with a partner?: Married  Intimate Partner Violence: Not At Risk (06/28/2023)   Received from [facility]   Humiliation, Afraid, Rape, and Kick questionnaire    Within the last year, have you been afraid of your partner or ex-partner?: No    Within the last year, have you been humiliated or emotionally abused in other ways by your partner or ex-partner?: No    Within the last year, have you been kicked, hit, slapped, or otherwise physically hurt by your partner or ex-partner?: No    Within the last year, have you been raped or forced to have any kind of sexual activity by your partner or ex-partner?: No    Review of Systems   General: Negative for anorexia, weight loss, fever, chills, fatigue, weakness. ENT: Negative for hoarseness, difficulty swallowing , nasal congestion. CV: Negative for chest pain, angina, palpitations, dyspnea on exertion, peripheral edema.  Respiratory: Negative for dyspnea at rest, dyspnea on exertion, cough, sputum, wheezing.  GI: See history of present illness. GU:  Negative for dysuria, hematuria, urinary incontinence, urinary  frequency, nocturnal urination.  Endo: Negative for unusual weight change.     Physical Exam   There were no vitals taken for this visit.   General: Well-nourished, well-developed in no acute distress.   Eyes: No icterus. Mouth: Oropharyngeal mucosa moist and pink   Lungs: Clear to auscultation bilaterally.  Heart: Regular rate and rhythm, no murmurs rubs or gallops.  Abdomen: Bowel sounds are normal, nontender, nondistended, no hepatosplenomegaly or masses,  no abdominal bruits or hernia , no rebound or guarding.  Rectal: not performed Extremities: No lower extremity edema. No clubbing or deformities. Neuro: Alert and oriented x 4   Skin: Warm and dry, no jaundice.   Psych: Alert and cooperative, normal mood and affect.  Labs   *** Imaging Studies   No results found.  Assessment/Plan:        Jenna Rivera, MHS, PA-C [facility]

## 2023-07-19 ENCOUNTER — Ambulatory Visit: Admitting: Gastroenterology

## 2023-07-20 DIAGNOSIS — J189 Pneumonia, unspecified organism: Secondary | ICD-10-CM | POA: Diagnosis not present

## 2023-07-20 DIAGNOSIS — R918 Other nonspecific abnormal finding of lung field: Secondary | ICD-10-CM | POA: Diagnosis not present

## 2023-07-20 DIAGNOSIS — I10 Essential (primary) hypertension: Secondary | ICD-10-CM | POA: Diagnosis not present

## 2023-07-20 DIAGNOSIS — J441 Chronic obstructive pulmonary disease with (acute) exacerbation: Secondary | ICD-10-CM | POA: Diagnosis not present

## 2023-07-20 DIAGNOSIS — J9611 Chronic respiratory failure with hypoxia: Secondary | ICD-10-CM | POA: Diagnosis not present

## 2023-07-20 DIAGNOSIS — R0602 Shortness of breath: Secondary | ICD-10-CM | POA: Diagnosis not present

## 2023-07-20 DIAGNOSIS — R52 Pain, unspecified: Secondary | ICD-10-CM | POA: Diagnosis not present

## 2023-07-20 DIAGNOSIS — J984 Other disorders of lung: Secondary | ICD-10-CM | POA: Diagnosis not present

## 2023-07-25 ENCOUNTER — Encounter: Payer: Self-pay | Admitting: Gastroenterology

## 2023-08-01 DIAGNOSIS — H401131 Primary open-angle glaucoma, bilateral, mild stage: Secondary | ICD-10-CM | POA: Diagnosis not present

## 2023-08-03 DIAGNOSIS — Z85828 Personal history of other malignant neoplasm of skin: Secondary | ICD-10-CM | POA: Diagnosis not present

## 2023-08-03 DIAGNOSIS — D225 Melanocytic nevi of trunk: Secondary | ICD-10-CM | POA: Diagnosis not present

## 2023-08-03 DIAGNOSIS — Z08 Encounter for follow-up examination after completed treatment for malignant neoplasm: Secondary | ICD-10-CM | POA: Diagnosis not present

## 2023-08-03 DIAGNOSIS — Z1283 Encounter for screening for malignant neoplasm of skin: Secondary | ICD-10-CM | POA: Diagnosis not present

## 2023-08-15 DIAGNOSIS — I10 Essential (primary) hypertension: Secondary | ICD-10-CM | POA: Diagnosis not present

## 2023-08-15 DIAGNOSIS — Z299 Encounter for prophylactic measures, unspecified: Secondary | ICD-10-CM | POA: Diagnosis not present

## 2023-08-15 DIAGNOSIS — Z79899 Other long term (current) drug therapy: Secondary | ICD-10-CM | POA: Diagnosis not present

## 2023-08-15 DIAGNOSIS — M2550 Pain in unspecified joint: Secondary | ICD-10-CM | POA: Diagnosis not present

## 2023-08-15 DIAGNOSIS — K589 Irritable bowel syndrome without diarrhea: Secondary | ICD-10-CM | POA: Diagnosis not present

## 2023-09-16 DIAGNOSIS — G4733 Obstructive sleep apnea (adult) (pediatric): Secondary | ICD-10-CM | POA: Diagnosis not present

## 2023-09-16 DIAGNOSIS — H4010X2 Unspecified open-angle glaucoma, moderate stage: Secondary | ICD-10-CM | POA: Diagnosis not present

## 2023-09-16 DIAGNOSIS — R52 Pain, unspecified: Secondary | ICD-10-CM | POA: Diagnosis not present

## 2023-09-16 DIAGNOSIS — I10 Essential (primary) hypertension: Secondary | ICD-10-CM | POA: Diagnosis not present

## 2023-09-16 DIAGNOSIS — L732 Hidradenitis suppurativa: Secondary | ICD-10-CM | POA: Diagnosis not present

## 2023-09-16 DIAGNOSIS — Z299 Encounter for prophylactic measures, unspecified: Secondary | ICD-10-CM | POA: Diagnosis not present

## 2023-09-16 DIAGNOSIS — J449 Chronic obstructive pulmonary disease, unspecified: Secondary | ICD-10-CM | POA: Diagnosis not present

## 2023-09-16 DIAGNOSIS — Z79899 Other long term (current) drug therapy: Secondary | ICD-10-CM | POA: Diagnosis not present

## 2023-09-23 DIAGNOSIS — Z0000 Encounter for general adult medical examination without abnormal findings: Secondary | ICD-10-CM | POA: Diagnosis not present

## 2023-09-23 DIAGNOSIS — Z1331 Encounter for screening for depression: Secondary | ICD-10-CM | POA: Diagnosis not present

## 2023-09-23 DIAGNOSIS — I10 Essential (primary) hypertension: Secondary | ICD-10-CM | POA: Diagnosis not present

## 2023-09-23 DIAGNOSIS — R5383 Other fatigue: Secondary | ICD-10-CM | POA: Diagnosis not present

## 2023-09-23 DIAGNOSIS — F419 Anxiety disorder, unspecified: Secondary | ICD-10-CM | POA: Diagnosis not present

## 2023-09-23 DIAGNOSIS — Z7189 Other specified counseling: Secondary | ICD-10-CM | POA: Diagnosis not present

## 2023-09-23 DIAGNOSIS — Z1339 Encounter for screening examination for other mental health and behavioral disorders: Secondary | ICD-10-CM | POA: Diagnosis not present

## 2023-09-23 DIAGNOSIS — G8929 Other chronic pain: Secondary | ICD-10-CM | POA: Diagnosis not present

## 2023-09-23 DIAGNOSIS — Z299 Encounter for prophylactic measures, unspecified: Secondary | ICD-10-CM | POA: Diagnosis not present

## 2023-09-23 DIAGNOSIS — F1120 Opioid dependence, uncomplicated: Secondary | ICD-10-CM | POA: Diagnosis not present

## 2023-09-23 DIAGNOSIS — Z79899 Other long term (current) drug therapy: Secondary | ICD-10-CM | POA: Diagnosis not present

## 2023-09-23 DIAGNOSIS — Z23 Encounter for immunization: Secondary | ICD-10-CM | POA: Diagnosis not present

## 2023-09-27 DIAGNOSIS — M170 Bilateral primary osteoarthritis of knee: Secondary | ICD-10-CM | POA: Diagnosis not present

## 2023-09-27 DIAGNOSIS — M2392 Unspecified internal derangement of left knee: Secondary | ICD-10-CM | POA: Diagnosis not present

## 2023-10-17 DIAGNOSIS — H9311 Tinnitus, right ear: Secondary | ICD-10-CM | POA: Diagnosis not present

## 2023-10-17 DIAGNOSIS — J439 Emphysema, unspecified: Secondary | ICD-10-CM | POA: Diagnosis not present

## 2023-10-17 DIAGNOSIS — R52 Pain, unspecified: Secondary | ICD-10-CM | POA: Diagnosis not present

## 2023-10-17 DIAGNOSIS — Z299 Encounter for prophylactic measures, unspecified: Secondary | ICD-10-CM | POA: Diagnosis not present

## 2023-10-17 DIAGNOSIS — I10 Essential (primary) hypertension: Secondary | ICD-10-CM | POA: Diagnosis not present

## 2023-10-19 ENCOUNTER — Encounter (HOSPITAL_COMMUNITY): Payer: Self-pay

## 2023-10-19 ENCOUNTER — Observation Stay (HOSPITAL_COMMUNITY)
Admission: EM | Admit: 2023-10-19 | Discharge: 2023-10-21 | Disposition: A | Discharge status: Home / Self Care | Attending: Gastroenterology | Admitting: Gastroenterology

## 2023-10-19 ENCOUNTER — Emergency Department (HOSPITAL_COMMUNITY)

## 2023-10-19 ENCOUNTER — Other Ambulatory Visit: Payer: Self-pay

## 2023-10-19 DIAGNOSIS — K922 Gastrointestinal hemorrhage, unspecified: Secondary | ICD-10-CM | POA: Diagnosis not present

## 2023-10-19 DIAGNOSIS — D649 Anemia, unspecified: Secondary | ICD-10-CM | POA: Diagnosis not present

## 2023-10-19 DIAGNOSIS — M1990 Unspecified osteoarthritis, unspecified site: Secondary | ICD-10-CM | POA: Diagnosis not present

## 2023-10-19 DIAGNOSIS — D539 Nutritional anemia, unspecified: Secondary | ICD-10-CM | POA: Insufficient documentation

## 2023-10-19 DIAGNOSIS — I10 Essential (primary) hypertension: Secondary | ICD-10-CM | POA: Diagnosis not present

## 2023-10-19 DIAGNOSIS — K219 Gastro-esophageal reflux disease without esophagitis: Secondary | ICD-10-CM | POA: Diagnosis present

## 2023-10-19 DIAGNOSIS — J9811 Atelectasis: Secondary | ICD-10-CM | POA: Diagnosis not present

## 2023-10-19 DIAGNOSIS — D531 Other megaloblastic anemias, not elsewhere classified: Secondary | ICD-10-CM | POA: Diagnosis not present

## 2023-10-19 DIAGNOSIS — E66812 Obesity, class 2: Secondary | ICD-10-CM | POA: Diagnosis not present

## 2023-10-19 DIAGNOSIS — J4489 Other specified chronic obstructive pulmonary disease: Secondary | ICD-10-CM | POA: Insufficient documentation

## 2023-10-19 DIAGNOSIS — D509 Iron deficiency anemia, unspecified: Secondary | ICD-10-CM | POA: Diagnosis not present

## 2023-10-19 DIAGNOSIS — K648 Other hemorrhoids: Secondary | ICD-10-CM | POA: Insufficient documentation

## 2023-10-19 DIAGNOSIS — K921 Melena: Principal | ICD-10-CM | POA: Insufficient documentation

## 2023-10-19 DIAGNOSIS — Z6835 Body mass index (BMI) 35.0-35.9, adult: Secondary | ICD-10-CM | POA: Diagnosis not present

## 2023-10-19 DIAGNOSIS — M810 Age-related osteoporosis without current pathological fracture: Secondary | ICD-10-CM | POA: Diagnosis not present

## 2023-10-19 DIAGNOSIS — R519 Headache, unspecified: Secondary | ICD-10-CM | POA: Diagnosis not present

## 2023-10-19 DIAGNOSIS — J449 Chronic obstructive pulmonary disease, unspecified: Secondary | ICD-10-CM | POA: Diagnosis not present

## 2023-10-19 DIAGNOSIS — G4733 Obstructive sleep apnea (adult) (pediatric): Secondary | ICD-10-CM | POA: Insufficient documentation

## 2023-10-19 DIAGNOSIS — Z87891 Personal history of nicotine dependence: Secondary | ICD-10-CM | POA: Diagnosis not present

## 2023-10-19 HISTORY — DX: Gastro-esophageal reflux disease without esophagitis: K21.9

## 2023-10-19 LAB — CBC WITH DIFFERENTIAL/PLATELET
Abs Immature Granulocytes: 0.03 K/uL (ref 0.00–0.07)
Basophils Absolute: 0 K/uL (ref 0.0–0.1)
Basophils Relative: 0 %
Eosinophils Absolute: 0.2 K/uL (ref 0.0–0.5)
Eosinophils Relative: 3 %
HCT: 19.7 % — ABNORMAL LOW (ref 36.0–46.0)
Hemoglobin: 5.7 g/dL — CL (ref 12.0–15.0)
Immature Granulocytes: 1 %
Lymphocytes Relative: 30 %
Lymphs Abs: 1.7 K/uL (ref 0.7–4.0)
MCH: 32.2 pg (ref 26.0–34.0)
MCHC: 28.9 g/dL — ABNORMAL LOW (ref 30.0–36.0)
MCV: 111.3 fL — ABNORMAL HIGH (ref 80.0–100.0)
Monocytes Absolute: 0.5 K/uL (ref 0.1–1.0)
Monocytes Relative: 8 %
Neutro Abs: 3.3 K/uL (ref 1.7–7.7)
Neutrophils Relative %: 58 %
Platelets: 227 K/uL (ref 150–400)
RBC: 1.77 MIL/uL — ABNORMAL LOW (ref 3.87–5.11)
RDW: 15.2 % (ref 11.5–15.5)
Smear Review: NORMAL
WBC: 5.6 K/uL (ref 4.0–10.5)
nRBC: 0.4 % — ABNORMAL HIGH (ref 0.0–0.2)

## 2023-10-19 LAB — URINALYSIS, ROUTINE W REFLEX MICROSCOPIC
Bilirubin Urine: NEGATIVE
Glucose, UA: NEGATIVE mg/dL
Hgb urine dipstick: NEGATIVE
Ketones, ur: NEGATIVE mg/dL
Nitrite: NEGATIVE
Protein, ur: NEGATIVE mg/dL
Specific Gravity, Urine: 1.003 — ABNORMAL LOW (ref 1.005–1.030)
pH: 6 (ref 5.0–8.0)

## 2023-10-19 LAB — COMPREHENSIVE METABOLIC PANEL WITH GFR
ALT: 12 U/L (ref 0–44)
AST: 20 U/L (ref 15–41)
Albumin: 3.7 g/dL (ref 3.5–5.0)
Alkaline Phosphatase: 48 U/L (ref 38–126)
Anion gap: 8 (ref 5–15)
BUN: 18 mg/dL (ref 8–23)
CO2: 27 mmol/L (ref 22–32)
Calcium: 9.1 mg/dL (ref 8.9–10.3)
Chloride: 107 mmol/L (ref 98–111)
Creatinine, Ser: 0.72 mg/dL (ref 0.44–1.00)
GFR, Estimated: >60 mL/min (ref >60)
Glucose, Bld: 104 mg/dL — ABNORMAL HIGH (ref 70–99)
Potassium: 3.9 mmol/L (ref 3.5–5.1)
Sodium: 142 mmol/L (ref 135–145)
Total Bilirubin: <0.2 mg/dL (ref 0.0–1.2)
Total Protein: 5.8 g/dL — ABNORMAL LOW (ref 6.5–8.1)

## 2023-10-19 LAB — PREPARE RBC (CROSSMATCH)

## 2023-10-19 LAB — MAGNESIUM: Magnesium: 2.3 mg/dL (ref 1.7–2.4)

## 2023-10-19 LAB — PROTIME-INR
INR: 0.9 (ref 0.8–1.2)
Prothrombin Time: 12.3 s (ref 11.4–15.2)

## 2023-10-19 LAB — HEMOGLOBIN AND HEMATOCRIT, BLOOD
HCT: 21.3 % — ABNORMAL LOW (ref 36.0–46.0)
Hemoglobin: 6.4 g/dL — CL (ref 12.0–15.0)

## 2023-10-19 LAB — POC OCCULT BLOOD, ED: Fecal Occult Blood: POSITIVE

## 2023-10-19 LAB — ABO/RH: ABO/RH(D): O POS

## 2023-10-19 MED ORDER — DIPHENHYDRAMINE HCL 50 MG/ML IJ SOLN
12.5000 mg | Freq: Once | INTRAMUSCULAR | Status: AC
  Administered 2023-10-19: 12.5 mg via INTRAVENOUS
  Filled 2023-10-19: qty 1

## 2023-10-19 MED ORDER — ACETAMINOPHEN 650 MG RE SUPP: 650.0000 mg | Freq: Four times a day (QID) | RECTAL | Status: DC | PRN

## 2023-10-19 MED ORDER — METOCLOPRAMIDE HCL 5 MG/ML IJ SOLN
10.0000 mg | Freq: Once | INTRAMUSCULAR | Status: AC
  Administered 2023-10-19: 10 mg via INTRAVENOUS
  Filled 2023-10-19: qty 2

## 2023-10-19 MED ORDER — PANTOPRAZOLE SODIUM 40 MG IV SOLR
40.0000 mg | Freq: Two times a day (BID) | INTRAVENOUS | Status: AC
Start: 2023-10-19 — End: ?
  Administered 2023-10-19 – 2023-10-21 (×4): 40 mg via INTRAVENOUS
  Filled 2023-10-19 (×4): qty 10

## 2023-10-19 MED ORDER — ONDANSETRON HCL 4 MG/2ML IJ SOLN: 4.0000 mg | Freq: Four times a day (QID) | INTRAMUSCULAR | Status: DC | PRN

## 2023-10-19 MED ORDER — LACTATED RINGERS IV BOLUS
500.0000 mL | Freq: Once | INTRAVENOUS | Status: AC
  Administered 2023-10-19: 500 mL via INTRAVENOUS

## 2023-10-19 MED ORDER — ONDANSETRON HCL 4 MG PO TABS: 4.0000 mg | ORAL_TABLET | Freq: Four times a day (QID) | ORAL | Status: DC | PRN

## 2023-10-19 MED ORDER — SODIUM CHLORIDE 0.9% IV SOLUTION: Freq: Once | INTRAVENOUS | Status: AC

## 2023-10-19 MED ORDER — ACETAMINOPHEN 325 MG PO TABS
650.0000 mg | ORAL_TABLET | Freq: Four times a day (QID) | ORAL | Status: DC | PRN
  Administered 2023-10-20 – 2023-10-21 (×3): 650 mg via ORAL
  Filled 2023-10-19 (×3): qty 2

## 2023-10-19 MED ORDER — OXYCODONE-ACETAMINOPHEN 5-325 MG PO TABS
1.0000 | ORAL_TABLET | Freq: Once | ORAL | Status: AC
  Administered 2023-10-19: 1 via ORAL
  Filled 2023-10-19: qty 1

## 2023-10-19 NOTE — ED Triage Notes (Signed)
 Pt arrived via [facility] c/o worsening headaches since Friday and reports following up with her PCP on Monday and was advised to stop taking her 15mg  Meloxicam. Pt also reports reports recent acid reflux, and feeling a pounding sensation in her head.

## 2023-10-19 NOTE — ED Notes (Signed)
 Patient transported to CT

## 2023-10-19 NOTE — ED Provider Notes (Signed)
 [place] [facility]ER[facility] DEPARTMENT AT [facility] Provider Note   CSN: [id number] Arrival date & time: 10/19/23  1612     Patient presents with: Migraine   Jenna Rivera is a 63 y.o. female.    Migraine Associated symptoms include headaches and shortness of breath.  Patient presents for multiple complaints.  Over the past month, she has had fatigue, generalized weakness, shortness of breath that is worsened with exertion, diffuse pain, nausea.  She does report dark stools over the past month.  Currently, she endorses pain in her head, neck, knees.  She is not on a blood thinner.  She does take meloxicam daily.     Prior to Admission medications   Medication Sig Start Date End Date Taking? Authorizing Provider  albuterol  (PROVENTIL ) (2.5 MG/3ML) 0.083% nebulizer solution Inhale 3 mLs into the lungs every 6 (six) hours as needed (wheezing/shortness of breath.).  11/19/12  Yes [provider]  albuterol  (VENTOLIN  HFA) 108 (90 Base) MCG/ACT inhaler Inhale 1-2 puffs into the lungs every 6 (six) hours as needed for wheezing or shortness of breath. 04/28/23  Yes Kassie Acquanetta Bradley, MD  Ascorbic Acid (VITAMIN C) 1000 MG tablet Take 1,000 mg by mouth in the morning.   Yes [provider]  Aspirin-Caffeine (BAYER BACK & BODY) 500-32.5 MG TABS Take 1 tablet by mouth daily as needed (pain).   Yes [provider]  calcium carbonate (TUMS - DOSED IN MG ELEMENTAL CALCIUM) 500 MG chewable tablet Chew 1 tablet by mouth daily as needed for indigestion or heartburn.   Yes [provider]  Cholecalciferol (VITAMIN D3) 50 MCG (2000 UT) TABS Take 2,000 Units by mouth in the morning.   Yes [provider]  dicyclomine  (BENTYL ) 10 MG capsule TAKE 1 CAPSULE BY MOUTH 4 TIMES A DAY BEFORE MEALS AND AT BEDTIME 06/14/23  Yes Ezzard Sonny RAMAN, PA-C  fluticasone (FLONASE) 50 MCG/ACT nasal spray Place 2 sprays into both nostrils daily as needed for allergies.   Yes  [provider]  furosemide (LASIX) 40 MG tablet Take 40 mg by mouth daily as needed for fluid (fluid retention).   Yes [provider]  gabapentin (NEURONTIN) 300 MG capsule Take 300-600 mg by mouth See admin instructions. Take 1 capsule (300 mg) by mouth in the morning & take 2 capsules (600 mg) by mouth at night   Yes [provider]  HYDROcodone-acetaminophen  (NORCO) 7.5-325 MG tablet Take 1 tablet by mouth every 6 (six) hours as needed (pain.).   Yes [provider]  hydrOXYzine (ATARAX) 10 MG tablet Take 10 mg by mouth 3 (three) times daily as needed for itching.   Yes [provider]  latanoprost (XALATAN) 0.005 % ophthalmic solution Place 1 drop into both eyes at bedtime.  06/21/19  Yes [provider]  levocetirizine (XYZAL) 5 MG tablet Take 5 mg by mouth daily. 10/15/23  Yes [provider]  LORazepam (ATIVAN) 1 MG tablet Take 1 mg by mouth 2 (two) times daily as needed for anxiety. 12/13/12  Yes [provider]  meloxicam (MOBIC) 15 MG tablet Take 15 mg by mouth daily. 12/06/20  Yes [provider]  montelukast (SINGULAIR) 10 MG tablet Take 10 mg by mouth at bedtime.   Yes [provider]  Multiple Vitamin (MULTIVITAMIN WITH MINERALS) TABS tablet Take 1 tablet by mouth in the morning.   Yes [provider]  predniSONE  (DELTASONE ) 10 MG tablet Take 10 mg by mouth daily as needed (  COPD flare up).   Yes [provider]  Semaglutide,0.25 or 0.5MG /DOS, (OZEMPIC, 0.25 OR 0.5 MG/DOSE,) 2 MG/1.5ML SOPN Inject 0.25 mg into the skin every Saturday.   Yes [provider]  tiZANidine (ZANAFLEX) 4 MG tablet Take 4 mg by mouth every 8 (eight) hours as needed for muscle spasms.   Yes [provider]    Allergies: Patient has no known allergies.    Review of Systems  Constitutional:  Positive for fatigue.  Respiratory:  Positive for shortness of breath.   Gastrointestinal:   Positive for blood in stool and diarrhea.  Musculoskeletal:  Positive for arthralgias and myalgias.  Skin:  Positive for pallor.  Neurological:  Positive for weakness (Generalized) and headaches.  All other systems reviewed and are negative.   Updated Vital Signs BP (!) 124/53   Pulse 73   Temp 97.9 F (36.6 C) (Oral)   Resp 18   Ht 5' (1.524 m)   Wt 90.7 kg   SpO2 100%   BMI 39.06 kg/m   Physical Exam Vitals and nursing note reviewed.  Constitutional:      General: She is not in acute distress.    Appearance: Normal appearance. She is well-developed. She is not ill-appearing, toxic-appearing or diaphoretic.  HENT:     Head: Normocephalic and atraumatic.     Right Ear: External ear normal.     Left Ear: External ear normal.     Nose: Nose normal.     Mouth/Throat:     Mouth: Mucous membranes are moist.  Eyes:     Extraocular Movements: Extraocular movements intact.     Conjunctiva/sclera: Conjunctivae normal.  Cardiovascular:     Rate and Rhythm: Normal rate and regular rhythm.     Heart sounds: No murmur heard. Pulmonary:     Effort: Pulmonary effort is normal. No respiratory distress.     Breath sounds: Normal breath sounds. No wheezing or rales.  Abdominal:     General: There is no distension.     Palpations: Abdomen is soft.     Tenderness: There is no abdominal tenderness.  Musculoskeletal:        General: No swelling. Normal range of motion.     Cervical back: Normal range of motion and neck supple.  Skin:    General: Skin is warm and dry.     Coloration: Skin is pale. Skin is not jaundiced.  Neurological:     General: No focal deficit present.     Mental Status: She is alert and oriented to person, place, and time.  Psychiatric:        Mood and Affect: Mood normal.        Behavior: Behavior normal.     (all labs ordered are listed, but only abnormal results are displayed) Labs Reviewed  CBC WITH DIFFERENTIAL/PLATELET - Abnormal; Notable for the  following components:      Result Value   RBC 1.77 (*)    Hemoglobin 5.7 (*)    HCT 19.7 (*)    MCV 111.3 (*)    MCHC 28.9 (*)    nRBC 0.4 (*)    All other components within normal limits  COMPREHENSIVE METABOLIC PANEL WITH GFR - Abnormal; Notable for the following components:   Glucose, Bld 104 (*)    Total Protein 5.8 (*)    All other components within normal limits  POC OCCULT BLOOD, ED - Abnormal  MAGNESIUM  PROTIME-INR  URINALYSIS, ROUTINE W REFLEX MICROSCOPIC  TYPE AND SCREEN  PREPARE RBC (CROSSMATCH)  ABO/RH    EKG: None  Radiology: CT Head Wo Contrast Result Date: 10/19/2023 CLINICAL DATA:  Headache, increasing frequency or severity. EXAM: CT HEAD WITHOUT CONTRAST TECHNIQUE: Contiguous axial images were obtained from the base of the skull through the vertex without intravenous contrast. RADIATION DOSE REDUCTION: This exam was performed according to the departmental dose-optimization program which includes automated exposure control, adjustment of the mA and/or kV according to patient size and/or use of iterative reconstruction technique. COMPARISON:  None Available. FINDINGS: Brain: There is no evidence of an acute infarct, intracranial hemorrhage, mass, midline shift, or extra-axial fluid collection. Cerebral volume is normal. The ventricles are normal in size. Vascular: Calcified atherosclerosis at the skull base. No hyperdense vessel. Skull: No fracture or suspicious lesion. Sinuses/Orbits: Visualized paranasal sinuses and mastoid air cells are clear. Unremarkable orbits. Other: None. IMPRESSION: Unremarkable CT appearance of the brain. Electronically Signed   By: Dasie Hamburg M.D.   On: 10/19/2023 19:09   DG Chest Portable 1 View Result Date: 10/19/2023 CLINICAL DATA:  Worsening headache with recent acid reflux. EXAM: PORTABLE CHEST 1 VIEW COMPARISON:  July 03, 2018 FINDINGS: The heart size and mediastinal contours are within normal limits. The lungs are hyperinflated.  Mild atelectatic changes are seen within the bilateral lung bases. No focal consolidation, pleural effusion or pneumothorax is identified. The visualized skeletal structures are unremarkable. IMPRESSION: COPD with mild bibasilar atelectasis. Electronically Signed   By: Suzen Dials M.D.   On: 10/19/2023 18:53     Procedures   Medications Ordered in the ED  0.9 %  sodium chloride infusion (Manually program via Guardrails IV Fluids) (has no administration in time range)  pantoprazole (PROTONIX) injection 40 mg (40 mg Intravenous Given 10/19/23 1843)  metoCLOPramide (REGLAN) injection 10 mg (10 mg Intravenous Given 10/19/23 1840)  diphenhydrAMINE (BENADRYL) injection 12.5 mg (12.5 mg Intravenous Given 10/19/23 1839)  oxyCODONE-acetaminophen  (PERCOCET/ROXICET) 5-325 MG per tablet 1 tablet (1 tablet Oral Given 10/19/23 1836)  lactated ringers  bolus 500 mL (0 mLs Intravenous Stopped 10/19/23 1955)                                    Medical Decision Making Amount and/or Complexity of Data Reviewed Labs: ordered. Radiology: ordered.  Risk Prescription drug management.   This patient presents to the ED for concern of multiple complaints, this involves an extensive number of treatment options, and is a complaint that carries with it a high risk of complications and morbidity.  The differential diagnosis includes anemia, infection, metabolic derangements, COPD exacerbation, neoplasm   Co morbidities / Chronic conditions that complicate the patient evaluation  HTN, HLD, COPD, chronic pain, anxiety, GERD   Additional history obtained:  Additional history obtained from EMR External records from outside source obtained and reviewed including N/A   Lab Tests:  I Ordered, and personally interpreted labs.  The pertinent results include: Normal kidney function, normal electrolytes, no leukocytosis.  Acute anemia is present with a hemoglobin of 5.7.  Patient's baseline is  13-14.   Imaging Studies ordered:  I ordered imaging studies including chest x-ray, CT head I independently visualized and interpreted imaging which showed no acute findings I agree with the radiologist interpretation   Cardiac Monitoring: / EKG:  The patient was maintained on a cardiac monitor.  I personally viewed and interpreted the cardiac monitored which showed an underlying rhythm of: Sinus rhythm   Problem  List / ED Course / Critical interventions / Medication management  Patient presenting for multiple complaints.  Her vital signs on arrival are notable for widened pulse pressure.  Laboratory workup was initiated prior to patient being bedded in the ED.  Patient is found to have an acute anemia.  In June, hemoglobin was 13.3.  Today, it is 5.7.  Patient does endorse dark stools over the past month.  She denies any hematochezia.  She has not had any abdominal discomfort.  She has a changes in her diet due to taking Ozempic.  She has been on a ketogenic diet.  She does take meloxicam.  Patient's abdomen soft and nontender.  Patient was consented for blood transfusion.  2 units PRBCs was ordered.  Hemoccult testing was positive.  I spoke with gastroenterologist on-call, Dr. Shaaron, who will see the patient in consult tomorrow morning.  He recommends clear liquid diet tonight and n.p.o. at midnight.  Patient was admitted for further management. I ordered medication including IV fluids for hydration, Protonix for upper GI bleed, PRBCs for symptomatic anemia, Reglan and Benadryl for headache, Percocet for analgesia Reevaluation of the patient after these medicines showed that the patient improved I have reviewed the patients home medicines and have made adjustments as needed   Consultations Obtained:  I requested consultation with the gastroenterologist, Dr. Shaaron,  and discussed lab and imaging findings as well as pertinent plan - they recommend: N.p.o. at midnight, GI will see in the  morning   Social Determinants of Health:  Lives independently  CRITICAL CARE Performed by: Bernardino Fireman   Total critical care time: 32 minutes  Critical care time was exclusive of separately billable procedures and treating other patients.  Critical care was necessary to treat or prevent imminent or life-threatening deterioration.  Critical care was time spent personally by me on the following activities: development of treatment plan with patient and/or surrogate as well as nursing, discussions with consultants, evaluation of patient's response to treatment, examination of patient, obtaining history from patient or surrogate, ordering and performing treatments and interventions, ordering and review of laboratory studies, ordering and review of radiographic studies, pulse oximetry and re-evaluation of patient's condition.     Final diagnoses:  Gastrointestinal hemorrhage, unspecified gastrointestinal hemorrhage type  Symptomatic anemia    ED Discharge Orders     None          Fireman Bernardino, MD 10/19/23 1956

## 2023-10-19 NOTE — ED Notes (Signed)
 X-ray at bedside.

## 2023-10-19 NOTE — ED Triage Notes (Signed)
 Pt also reports being on Ozempic and is concerned her symptoms could be a side-effect of that medication.

## 2023-10-19 NOTE — H&P (Addendum)
 History and Physical    Patient: Jenna Rivera FMW:[id number] DOB: 18-Dec-1960 DOA: 10/19/2023 DOS: the patient was seen and examined on 10/19/2023 PCP: [name], FNP  Patient coming from: Home  Chief Complaint:  Chief Complaint  Patient presents with   Migraine   HPI: Jenna Rivera is a 63 y.o. female with medical history significant of hypertension, COPD, osteoarthritis, anxiety and obesity who presents to the emergency department due to headache, ringing in the ears which started on Friday (10/10) and about 1 month of generalized weakness and shortness of breath on exertion, she went to her PCP on Monday (10/13) and was asked to stop taking Mobic.  She was asked to return to the clinic on Thursday (10/16) if she does not see any improvement in symptoms.  Patient complained of worsening symptoms today due to persistent headache, so she decided to go to the ED for further evaluation and management.  She endorsed about 1 month of black stool and also complained of several days of burning sensation in midsternal area, but denies being on a blood thinner or taking any other NSAIDs other than Mobic mentioned above.  ED Course: In the emergency department, temperature was 37.5 F, BP 110/27, other vital signs were within normal range.  Workup in the ED showed WBC 5.6, H/H5.7/19.7.  BMP was normal except for blood glucose of 104.  Magnesium 2.3, urinalysis was unimpressive for UTI.  FOBT was positive. CT head without contrast showed unremarkable CT appearance of the brain Chest x-ray showed COPD with mild bibasilar atelectasis She was treated with Percocet, Reglan, IV LR 500 mL was given and IV Benadryl 12.5 mg x 1 was given. Dr. Shaaron (gastroenterologist) was consulted and recommended admitting patient with plan to follow-up patient in the morning per EDP.  [facility] was asked to admit patient  Review of Systems: Review of systems as noted in the HPI. All other systems reviewed and are  negative.   Past Medical History:  Diagnosis Date   Acute bronchitis with COPD (HCC)    Allergic rhinitis    Anxiety    Asthma    Cervical disc herniation    Chest pain    Chronic pain    COPD (chronic obstructive pulmonary disease) (HCC)    Edema    HTN (hypertension)    Hyperlipidemia    Lumbago    Neck pain    Osteoporosis    Reflux esophagitis    Rosacea    Sleep apnea    uses CPAP   Past Surgical History:  Procedure Laterality Date   ABDOMINAL HYSTERECTOMY     BIOPSY  06/18/2021   Procedure: BIOPSY;  Surgeon: Shaaron Lamar HERO, MD;  Location: [facility];  Service: Endoscopy;;   CHOLECYSTECTOMY     1990s   COLONOSCOPY  12/05/2012   [place]; Dr. Donnel; sigmoid diverticulosis, otherwise normal exam.  Recommended repeat in 10 years.   COLONOSCOPY WITH PROPOFOL  N/A 06/18/2021   Procedure: COLONOSCOPY WITH PROPOFOL ;  Surgeon: Shaaron Lamar HERO, MD;  Location: [facility];  Service: Endoscopy;  Laterality: N/A;  2:45pm, random colon bx   ESOPHAGOGASTRODUODENOSCOPY (EGD) WITH PROPOFOL  N/A 08/13/2019   Procedure: ESOPHAGOGASTRODUODENOSCOPY (EGD) WITH PROPOFOL ;  Surgeon: Shaaron Lamar HERO, MD;  Normal examined esophagus s/p dilations, numerous gastric erosions in the antrum and gastric body s/p biopsy, patent pylorus, bulbar erosions, otherwise D1 and D2 appeared normal. Pathology with gastric antral and oxyntic mucosa, negative for H. pylori.    MALONEY DILATION N/A  08/13/2019   Procedure: AGAPITO DILATION;  Surgeon: Shaaron Lamar HERO, MD;  Location: [facility];  Service: Endoscopy;  Laterality: N/A;   MOUTH SURGERY     TUBAL LIGATION      Social History:  reports that she quit smoking about 32 years ago. Her smoking use included cigarettes. She started smoking about 47 years ago. She has a 15 pack-year smoking history. She has been exposed to tobacco smoke. She has never used smokeless tobacco. She reports current alcohol  use. She reports that she does not use drugs.   No Known  Allergies  Family History  Problem Relation Age of Onset   COPD Mother    Cancer Father    Breast cancer Sister    Lung cancer Sister    Multiple sclerosis Sister    Colon cancer Neg Hx        family history is limited.      Prior to Admission medications   Medication Sig Start Date End Date Taking? Authorizing Provider  albuterol  (PROVENTIL ) (2.5 MG/3ML) 0.083% nebulizer solution Inhale 3 mLs into the lungs every 6 (six) hours as needed (wheezing/shortness of breath.).  11/19/12  Yes [provider]  albuterol  (VENTOLIN  HFA) 108 (90 Base) MCG/ACT inhaler Inhale 1-2 puffs into the lungs every 6 (six) hours as needed for wheezing or shortness of breath. 04/28/23  Yes Kassie Acquanetta Bradley, MD  Ascorbic Acid (VITAMIN C) 1000 MG tablet Take 1,000 mg by mouth in the morning.   Yes [provider]  Aspirin-Caffeine (BAYER BACK & BODY) 500-32.5 MG TABS Take 1 tablet by mouth daily as needed (pain).   Yes [provider]  calcium carbonate (TUMS - DOSED IN MG ELEMENTAL CALCIUM) 500 MG chewable tablet Chew 1 tablet by mouth daily as needed for indigestion or heartburn.   Yes [provider]  Cholecalciferol (VITAMIN D3) 50 MCG (2000 UT) TABS Take 2,000 Units by mouth in the morning.   Yes [provider]  dicyclomine  (BENTYL ) 10 MG capsule TAKE 1 CAPSULE BY MOUTH 4 TIMES A DAY BEFORE MEALS AND AT BEDTIME 06/14/23  Yes Ezzard Sonny RAMAN, PA-C  fluticasone (FLONASE) 50 MCG/ACT nasal spray Place 2 sprays into both nostrils daily as needed for allergies.   Yes [provider]  furosemide (LASIX) 40 MG tablet Take 40 mg by mouth daily as needed for fluid (fluid retention).   Yes [provider]  gabapentin (NEURONTIN) 300 MG capsule Take 300-600 mg by mouth See admin instructions. Take 1 capsule (300 mg) by mouth in the morning & take 2 capsules (600 mg) by mouth at night   Yes [provider]  HYDROcodone-acetaminophen  (NORCO) 7.5-325 MG  tablet Take 1 tablet by mouth every 6 (six) hours as needed (pain.).   Yes [provider]  hydrOXYzine (ATARAX) 10 MG tablet Take 10 mg by mouth 3 (three) times daily as needed for itching.   Yes [provider]  latanoprost (XALATAN) 0.005 % ophthalmic solution Place 1 drop into both eyes at bedtime.  06/21/19  Yes [provider]  levocetirizine (XYZAL) 5 MG tablet Take 5 mg by mouth daily. 10/15/23  Yes [provider]  LORazepam (ATIVAN) 1 MG tablet Take 1 mg by mouth 2 (two) times daily as needed for anxiety. 12/13/12  Yes [provider]  meloxicam (MOBIC) 15 MG tablet Take 15 mg by mouth daily. 12/06/20  Yes [provider]  montelukast (SINGULAIR) 10 MG tablet Take 10 mg by mouth at  bedtime.   Yes [provider]  Multiple Vitamin (MULTIVITAMIN WITH MINERALS) TABS tablet Take 1 tablet by mouth in the morning.   Yes [provider]  predniSONE  (DELTASONE ) 10 MG tablet Take 10 mg by mouth daily as needed (COPD flare up).   Yes [provider]  Semaglutide,0.25 or 0.5MG /DOS, (OZEMPIC, 0.25 OR 0.5 MG/DOSE,) 2 MG/1.5ML SOPN Inject 0.25 mg into the skin every Saturday.   Yes [provider]  tiZANidine (ZANAFLEX) 4 MG tablet Take 4 mg by mouth every 8 (eight) hours as needed for muscle spasms.   Yes [provider]    Physical Exam: BP (!) 110/42   Pulse 72   Temp 98.1 F (36.7 C) (Oral)   Resp 18   Ht 5' (1.524 m)   Wt 90.7 kg   SpO2 100%   BMI 39.06 kg/m   General: 63 y.o. year-old female well developed well nourished in no acute distress.  Alert and oriented x3. HEENT: NCAT, EOMI, pale conjunctiva Neck: Supple, trachea medial Cardiovascular: Regular rate and rhythm with no rubs or gallops.  No thyromegaly or JVD noted.  No lower extremity edema. 2/4 pulses in all 4 extremities. Respiratory: Clear to auscultation with no wheezes or rales. Good inspiratory effort. Abdomen: Soft,  nontender nondistended with normal bowel sounds x4 quadrants. Muskuloskeletal: No cyanosis, clubbing or edema noted bilaterally Neuro: CN II-XII intact, strength 5/5 x 4, sensation, reflexes intact Skin: Pale.  No ulcerative lesions noted or rashes Psychiatry: Judgement and insight appear normal. Mood is appropriate for condition and setting          Labs on Admission:  Basic Metabolic Panel: Recent Labs  Lab 10/19/23 1726 10/19/23 1823  NA 142  --   K 3.9  --   CL 107  --   CO2 27  --   GLUCOSE 104*  --   BUN 18  --   CREATININE 0.72  --   CALCIUM 9.1  --   MG  --  2.3   Liver Function Tests: Recent Labs  Lab 10/19/23 1726  AST 20  ALT 12  ALKPHOS 48  BILITOT <0.2  PROT 5.8*  ALBUMIN 3.7   No results for input(s): LIPASE, AMYLASE in the last 168 hours. No results for input(s): AMMONIA in the last 168 hours. CBC: Recent Labs  Lab 10/19/23 1726 10/19/23 2149  WBC 5.6  --   NEUTROABS 3.3  --   HGB 5.7* 6.4*  HCT 19.7* 21.3*  MCV 111.3*  --   PLT 227  --    Cardiac Enzymes: No results for input(s): CKTOTAL, CKMB, CKMBINDEX, TROPONINI in the last 168 hours.  BNP (last 3 results) No results for input(s): BNP in the last 8760 hours.  ProBNP (last 3 results) No results for input(s): PROBNP in the last 8760 hours.  CBG: No results for input(s): GLUCAP in the last 168 hours.  Radiological Exams on Admission: CT Head Wo Contrast Result Date: 10/19/2023 CLINICAL DATA:  Headache, increasing frequency or severity. EXAM: CT HEAD WITHOUT CONTRAST TECHNIQUE: Contiguous axial images were obtained from the base of the skull through the vertex without intravenous contrast. RADIATION DOSE REDUCTION: This exam was performed according to the departmental dose-optimization program which includes automated exposure control, adjustment of the mA and/or kV according to patient size and/or use of iterative reconstruction technique. COMPARISON:  None Available.  FINDINGS: Brain: There is no evidence of an acute infarct, intracranial hemorrhage, mass, midline shift, or extra-axial fluid collection. Cerebral  volume is normal. The ventricles are normal in size. Vascular: Calcified atherosclerosis at the skull base. No hyperdense vessel. Skull: No fracture or suspicious lesion. Sinuses/Orbits: Visualized paranasal sinuses and mastoid air cells are clear. Unremarkable orbits. Other: None. IMPRESSION: Unremarkable CT appearance of the brain. Electronically Signed   By: Dasie Hamburg M.D.   On: 10/19/2023 19:09   DG Chest Portable 1 View Result Date: 10/19/2023 CLINICAL DATA:  Worsening headache with recent acid reflux. EXAM: PORTABLE CHEST 1 VIEW COMPARISON:  July 03, 2018 FINDINGS: The heart size and mediastinal contours are within normal limits. The lungs are hyperinflated. Mild atelectatic changes are seen within the bilateral lung bases. No focal consolidation, pleural effusion or pneumothorax is identified. The visualized skeletal structures are unremarkable. IMPRESSION: COPD with mild bibasilar atelectasis. Electronically Signed   By: Suzen Dials M.D.   On: 10/19/2023 18:53    EKG: I independently viewed the EKG done and my findings are as followed: EKG was not done in the ED  Assessment/Plan Present on Admission:  GI bleed  COPD (chronic obstructive pulmonary disease) (HCC)  Gastroesophageal reflux disease  Principal Problem:   GI bleed Active Problems:   COPD (chronic obstructive pulmonary disease) (HCC)   OSA on CPAP   Gastroesophageal reflux disease   Macrocytic anemia   Obesity, Class II, BMI 35-39.9   Essential hypertension   Osteoarthritis  GI bleed/ABLA H/H= 5.7/19.7, this was 13.3/39.5 on 06/29/2023 (Care Everywhere) Hemoccult was positive Type and crossmatch was done 2 units of PRBC was ordered to be transfused Continue IV Protonix 40 mg twice daily Gastroenterologist (Dr. Shaaron) was consulted and will see patient in the morning  per EDP  Macrocytic anemia MCV 111.3 Vitamin B12 and folate levels will be checked  COPD (not in acute exacerbation) Continue albuterol , fluticasone, montelukast  OA  Continue Tylenol  as needed Hold meloxicam in the setting of GI bleed  Essential hypertension Continue lisinopril  GERD  Continue pantoprazole  OSA on CPAP Continue CPAP  Obesity class II (BMI 39.06) Diet and lifestyle modification   DVT prophylaxis: SCDs  Code Status: Full code  Family Communication: Husband at bedside (all questions answered to satisfaction)  Consults: Gastroenterology (Dr. Shaaron)  Severity of Illness: The appropriate patient status for this patient is OBSERVATION. Observation status is judged to be reasonable and necessary in order to provide the required intensity of service to ensure the patient's safety. The patient's presenting symptoms, physical exam findings, and initial radiographic and laboratory data in the context of their medical condition is felt to place them at decreased risk for further clinical deterioration. Furthermore, it is anticipated that the patient will be medically stable for discharge from the hospital within 2 midnights of admission.   Author: Ebrahim Deremer, DO 10/19/2023 10:44 PM  For on call review [email or web address].

## 2023-10-19 NOTE — ED Notes (Signed)
 Date and time results received: 10/19/23 1806   Test: hemoglobin Critical Value: 5.7  Name of Provider Notified: Melvenia

## 2023-10-20 ENCOUNTER — Observation Stay (HOSPITAL_COMMUNITY): Admitting: Anesthesiology

## 2023-10-20 ENCOUNTER — Encounter (HOSPITAL_COMMUNITY): Payer: Self-pay | Admitting: Internal Medicine

## 2023-10-20 ENCOUNTER — Encounter (HOSPITAL_COMMUNITY)
Admission: EM | Disposition: A | Discharge status: Home / Self Care | Payer: Self-pay | Source: Home / Self Care | Attending: Emergency Medicine

## 2023-10-20 DIAGNOSIS — K921 Melena: Secondary | ICD-10-CM | POA: Diagnosis not present

## 2023-10-20 DIAGNOSIS — K3189 Other diseases of stomach and duodenum: Secondary | ICD-10-CM | POA: Diagnosis not present

## 2023-10-20 DIAGNOSIS — R1310 Dysphagia, unspecified: Secondary | ICD-10-CM | POA: Diagnosis not present

## 2023-10-20 DIAGNOSIS — D649 Anemia, unspecified: Secondary | ICD-10-CM | POA: Diagnosis not present

## 2023-10-20 DIAGNOSIS — K922 Gastrointestinal hemorrhage, unspecified: Secondary | ICD-10-CM | POA: Diagnosis not present

## 2023-10-20 DIAGNOSIS — K226 Gastro-esophageal laceration-hemorrhage syndrome: Secondary | ICD-10-CM

## 2023-10-20 DIAGNOSIS — D62 Acute posthemorrhagic anemia: Secondary | ICD-10-CM | POA: Diagnosis not present

## 2023-10-20 DIAGNOSIS — J449 Chronic obstructive pulmonary disease, unspecified: Secondary | ICD-10-CM | POA: Diagnosis not present

## 2023-10-20 DIAGNOSIS — Z87891 Personal history of nicotine dependence: Secondary | ICD-10-CM | POA: Diagnosis not present

## 2023-10-20 HISTORY — PX: ESOPHAGEAL DILATION: SHX303

## 2023-10-20 HISTORY — PX: ESOPHAGOGASTRODUODENOSCOPY: SHX5428

## 2023-10-20 LAB — COMPREHENSIVE METABOLIC PANEL WITH GFR
ALT: 12 U/L (ref 0–44)
AST: 17 U/L (ref 15–41)
Albumin: 3.4 g/dL — ABNORMAL LOW (ref 3.5–5.0)
Alkaline Phosphatase: 42 U/L (ref 38–126)
Anion gap: 7 (ref 5–15)
BUN: 13 mg/dL (ref 8–23)
CO2: 27 mmol/L (ref 22–32)
Calcium: 8.7 mg/dL — ABNORMAL LOW (ref 8.9–10.3)
Chloride: 109 mmol/L (ref 98–111)
Creatinine, Ser: 0.66 mg/dL (ref 0.44–1.00)
GFR, Estimated: >60 mL/min (ref >60)
Glucose, Bld: 83 mg/dL (ref 70–99)
Potassium: 3.8 mmol/L (ref 3.5–5.1)
Sodium: 143 mmol/L (ref 135–145)
Total Bilirubin: 0.3 mg/dL (ref 0.0–1.2)
Total Protein: 5.3 g/dL — ABNORMAL LOW (ref 6.5–8.1)

## 2023-10-20 LAB — TYPE AND SCREEN
ABO/RH(D): O POS
Antibody Screen: NEGATIVE
Unit division: 0
Unit division: 0

## 2023-10-20 LAB — CBC
HCT: 24.9 % — ABNORMAL LOW (ref 36.0–46.0)
Hemoglobin: 7.6 g/dL — ABNORMAL LOW (ref 12.0–15.0)
MCH: 30 pg (ref 26.0–34.0)
MCHC: 30.5 g/dL (ref 30.0–36.0)
MCV: 98.4 fL (ref 80.0–100.0)
Platelets: 207 K/uL (ref 150–400)
RBC: 2.53 MIL/uL — ABNORMAL LOW (ref 3.87–5.11)
RDW: 20.5 % — ABNORMAL HIGH (ref 11.5–15.5)
WBC: 6.3 K/uL (ref 4.0–10.5)
nRBC: 0 % (ref 0.0–0.2)

## 2023-10-20 LAB — BPAM RBC
Blood Product Expiration Date: 202511102359
Blood Product Expiration Date: 202511102359
ISSUE DATE / TIME: 202510151948
ISSUE DATE / TIME: 202510152159
Unit Type and Rh: 5100
Unit Type and Rh: 5100

## 2023-10-20 LAB — FERRITIN: Ferritin: 34 ng/mL (ref 11–307)

## 2023-10-20 LAB — IRON AND TIBC
Iron: 25 ug/dL — ABNORMAL LOW (ref 28–170)
Saturation Ratios: 7 % — ABNORMAL LOW (ref 10.4–31.8)
TIBC: 329 ug/dL (ref 250–450)
UIBC: 304 ug/dL

## 2023-10-20 LAB — PHOSPHORUS: Phosphorus: 3.4 mg/dL (ref 2.5–4.6)

## 2023-10-20 LAB — VITAMIN B12: Vitamin B-12: 693 pg/mL (ref 180–914)

## 2023-10-20 LAB — FOLATE: Folate: >20 ng/mL (ref >5.9)

## 2023-10-20 LAB — HIV ANTIBODY (ROUTINE TESTING W REFLEX): HIV Screen 4th Generation wRfx: NONREACTIVE

## 2023-10-20 SURGERY — EGD (ESOPHAGOGASTRODUODENOSCOPY)
Anesthesia: General

## 2023-10-20 MED ORDER — SODIUM CHLORIDE (PF) 0.9 % IJ SOLN
PREFILLED_SYRINGE | INTRAVENOUS | Status: DC | PRN
  Administered 2023-10-20: 2 mL

## 2023-10-20 MED ORDER — PROPOFOL 500 MG/50ML IV EMUL
INTRAVENOUS | Status: DC | PRN
  Administered 2023-10-20: 150 ug/kg/min via INTRAVENOUS
  Administered 2023-10-20: 80 mg via INTRAVENOUS

## 2023-10-20 MED ORDER — LATANOPROST 0.005 % OP SOLN
1.0000 [drp] | Freq: Every day | OPHTHALMIC | Status: DC
  Administered 2023-10-20: 1 [drp] via OPHTHALMIC
  Filled 2023-10-20: qty 2.5

## 2023-10-20 MED ORDER — LACTATED RINGERS IV SOLN: INTRAVENOUS | Status: DC | PRN

## 2023-10-20 MED ORDER — ALBUTEROL SULFATE (2.5 MG/3ML) 0.083% IN NEBU: 3.0000 mL | INHALATION_SOLUTION | Freq: Four times a day (QID) | RESPIRATORY_TRACT | Status: DC | PRN

## 2023-10-20 MED ORDER — FLUTICASONE PROPIONATE 50 MCG/ACT NA SUSP: 2.0000 | Freq: Every day | NASAL | Status: DC | PRN

## 2023-10-20 MED ORDER — LIDOCAINE 2% (20 MG/ML) 5 ML SYRINGE
INTRAMUSCULAR | Status: AC
  Filled 2023-10-20: qty 5

## 2023-10-20 MED ORDER — EPINEPHRINE 1 MG/10ML IV SOSY
PREFILLED_SYRINGE | INTRAVENOUS | Status: AC
  Filled 2023-10-20: qty 10

## 2023-10-20 MED ORDER — MONTELUKAST SODIUM 10 MG PO TABS
10.0000 mg | ORAL_TABLET | Freq: Every day | ORAL | Status: DC
  Administered 2023-10-20: 10 mg via ORAL
  Filled 2023-10-20: qty 1

## 2023-10-20 MED ORDER — STERILE WATER FOR IRRIGATION IR SOLN
Status: DC | PRN
  Administered 2023-10-20: 60 mL

## 2023-10-20 MED ORDER — LIDOCAINE 2% (20 MG/ML) 5 ML SYRINGE
INTRAMUSCULAR | Status: DC | PRN
  Administered 2023-10-20: 60 mg via INTRAVENOUS

## 2023-10-20 NOTE — Transfer of Care (Signed)
 Immediate Anesthesia Transfer of Care Note  Patient: Jenna Rivera  Procedure(s) Performed: EGD (ESOPHAGOGASTRODUODENOSCOPY) DILATION, ESOPHAGUS  Patient Location: PACU  Anesthesia Type:General  Level of Consciousness: awake  Airway & Oxygen Therapy: Patient Spontanous Breathing  Post-op Assessment: Report given to RN and Post -op Vital signs reviewed and stable  Post vital signs: Reviewed and stable  Last Vitals:  Vitals Value Taken Time  BP 150/55   Temp 98.5   Pulse 96 10/20/23 09:55  Resp 28 10/20/23 09:55  SpO2 98 % 10/20/23 09:55  Vitals shown include unfiled device data.  Last Pain:  Vitals:   10/20/23 0935  TempSrc:   PainSc: 0-No pain         Complications: No notable events documented.

## 2023-10-20 NOTE — Brief Op Note (Signed)
 10/20/2023  10:02 AM  PATIENT:  Jenna Rivera  63 y.o. female  PRE-OPERATIVE DIAGNOSIS:  acute blood loss anemia, melena, epigastric pain, dysphagia  POST-OPERATIVE DIAGNOSIS:  bleeding at GE junction injected with epinephrine  and clipped x1  PROCEDURE:  Procedure(s): EGD (ESOPHAGOGASTRODUODENOSCOPY) (N/A) DILATION, ESOPHAGUS (N/A)  SURGEON:  Surgeons and Role:    * Eartha Angelia Sieving, MD - Primary  Patient underwent EGD under propofol  sedation.  Tolerated the procedure adequately.   FINDINGS: - Normal esophagus.  - Active bleeding at GE junction, possible Dieulafoy lesion?Jenna Rivera manufacturer: [employer].  Clip (MR safe) was placed.  Injected.  - Normal stomach.  Biopsied.  - Normal examined duodenum.   RECOMMENDATIONS - Return patient to hospital ward for ongoing care.  - Full liquid diet.  - Await pathology results.  - Use Protonix (pantoprazole) 40 mg PO BID.  - Daily H/H - No high dose aspirin, ibuprofen, naproxen, or other non-steroidal anti-inflammatory drugs.   Sieving Eartha, MD Gastroenterology and Hepatology [facility]

## 2023-10-20 NOTE — Consult Note (Addendum)
 Gastroenterology Consult   Referring Provider: No ref. provider found Primary Care Physician:  [name], FNP Primary Gastroenterologist:  Ozell Hollingshead, MD   Patient ID: ZOEIE RITTER; [id number]; [birth year]   Admit date: 10/19/2023  LOS: 0 days   Date of Consultation: 10/20/2023  Reason for Consultation:  GI Bleed    History of Present Illness   Jenna Rivera is a 63 y.o. female with sleep apnea, HTN, HLD, COPD, GERD, osteoarthritis presenting to ED with severe headache, ringing in the ears, palpitations, and progressive generalized weakness/DOE.   ED Course: VSS Hgb 5.7  (Hgb 13.3 06/2023), received 2 units of prbcs-->Hgb 7.6 MCV 111.3-->98.4 Plt 227 BUN 18 Stool heme positive IV Pantoprazole Q12 Head CT for headache: unremarkable  Today:  Folate >20 B12 pend  GI Consult: Admission to [facility] in June with sepsis due to PNA. Hgb 13.3 at that time.  Chronic diarrhea since cholecystectomy in the 1990s. Random colon biopsies negative in 2023. Celiac serologies negative 2021.    She has chronic diarrhea. May take 30 minutes to an hour to complete stool. Goes back several times until finished. No brbpr. About one month ago, after taking an antibiotic she noticed her stools turned very dark, potentially black. Cleared up however. She has had some epigastric burning if takes her morning medications on empty stomach. No N/V. No heartburn. She had some solid food dysphagia, not as bad as in 2021 when she had her esophagus stretched (normal esophageal but dilation helpful).   Recently over the past one month she has noted increased fatigue. She thought it was stressed related. Her husband fractured his pelvis this year. She has history of 100 pound weight loss over 3 year period and has been able to mostly maintain this but did utilize wegovy and then ozempic over the past 5 months (samples provided by pcp) to help loose additional weight.   She has been on Mobic  chronically. Occasionally also takes ASA powders. Rarely takes ibuprofen. Occasional alcohol . She is not on a PPI.  Friday she noted pounding headache, ringing in the ears. Increased fatigue. Chest pounding. Mild DOE. Saw PCP on Monday. No labs. Came to ED for worsening symptoms.   Colonoscopy 07/2021: -diverticulosis - non-bleeding internal hemorrhoids -s/p random colon biopsies, neg -next colonoscopy 10 years  EGD 08/2019: -normal esophagus s/p dilation -gastric erosions s/p bx neg h.pylori  Prior to Admission medications   Medication Sig Start Date End Date Taking? Authorizing Provider  albuterol  (PROVENTIL ) (2.5 MG/3ML) 0.083% nebulizer solution Inhale 3 mLs into the lungs every 6 (six) hours as needed (wheezing/shortness of breath.).  11/19/12  Yes [provider]  albuterol  (VENTOLIN  HFA) 108 (90 Base) MCG/ACT inhaler Inhale 1-2 puffs into the lungs every 6 (six) hours as needed for wheezing or shortness of breath. 04/28/23  Yes Kassie Acquanetta Bradley, MD  Ascorbic Acid (VITAMIN C) 1000 MG tablet Take 1,000 mg by mouth in the morning.   Yes [provider]  Aspirin-Caffeine (BAYER BACK & BODY) 500-32.5 MG TABS Take 1 tablet by mouth daily as needed (pain).   Yes [provider]  calcium carbonate (TUMS - DOSED IN MG ELEMENTAL CALCIUM) 500 MG chewable tablet Chew 1 tablet by mouth daily as needed for indigestion or heartburn.   Yes [provider]  Cholecalciferol (VITAMIN D3) 50 MCG (2000 UT) TABS Take 2,000 Units by mouth in the morning.   Yes [provider]  dicyclomine  (BENTYL ) 10 MG capsule TAKE 1  CAPSULE BY MOUTH 4 TIMES A DAY BEFORE MEALS AND AT BEDTIME 06/14/23  Yes Ezzard Sonny RAMAN, PA-C  fluticasone (FLONASE) 50 MCG/ACT nasal spray Place 2 sprays into both nostrils daily as needed for allergies.   Yes [provider]  furosemide (LASIX) 40 MG tablet Take 40 mg by mouth daily as needed for fluid (fluid retention).   Yes [provider]  gabapentin (NEURONTIN) 300 MG capsule Take 300-600 mg by mouth See admin instructions. Take 1 capsule (300 mg) by mouth in the morning & take 2 capsules (600 mg) by mouth at night   Yes [provider]  HYDROcodone-acetaminophen  (NORCO) 7.5-325 MG tablet Take 1 tablet by mouth every 6 (six) hours as needed (pain.).   Yes [provider]  hydrOXYzine (ATARAX) 10 MG tablet Take 10 mg by mouth 3 (three) times daily as needed for itching.   Yes [provider]  latanoprost (XALATAN) 0.005 % ophthalmic solution Place 1 drop into both eyes at bedtime.  06/21/19  Yes [provider]  levocetirizine (XYZAL) 5 MG tablet Take 5 mg by mouth daily. 10/15/23  Yes [provider]  LORazepam (ATIVAN) 1 MG tablet Take 1 mg by mouth 2 (two) times daily as needed for anxiety. 12/13/12  Yes [provider]  meloxicam (MOBIC) 15 MG tablet Take 15 mg by mouth daily. 12/06/20  Yes [provider]  montelukast (SINGULAIR) 10 MG tablet Take 10 mg by mouth at bedtime.   Yes [provider]  Multiple Vitamin (MULTIVITAMIN WITH MINERALS) TABS tablet Take 1 tablet by mouth in the morning.   Yes [provider]  predniSONE  (DELTASONE ) 10 MG tablet Take 10 mg by mouth daily as needed (COPD flare up).   Yes [provider]  Semaglutide,0.25 or 0.5MG /DOS, (OZEMPIC, 0.25 OR 0.5 MG/DOSE,) 2 MG/1.5ML SOPN Inject 0.25 mg into the skin every Saturday.   Yes [provider]  tiZANidine (ZANAFLEX) 4 MG tablet Take 4 mg by mouth every 8 (eight) hours as needed for muscle spasms.   Yes [provider]    Current Facility-Administered Medications  Medication Dose Route Frequency Provider Last Rate Last Admin   [MAR Hold] acetaminophen  (TYLENOL ) tablet 650 mg  650 mg Oral Q6H PRN Adefeso, Oladapo, DO   650 mg at 10/20/23 0801   Or   [MAR Hold] acetaminophen  (TYLENOL ) suppository 650 mg  650 mg Rectal Q6H PRN Adefeso,  Oladapo, DO       [MAR Hold] albuterol  (PROVENTIL ) (2.5 MG/3ML) 0.083% nebulizer solution 3 mL  3 mL Inhalation Q6H PRN Adefeso, Oladapo, DO       [MAR Hold] fluticasone (FLONASE) 50 MCG/ACT nasal spray 2 spray  2 spray Each Nare Daily PRN Adefeso, Oladapo, DO       [MAR Hold] montelukast (SINGULAIR) tablet 10 mg  10 mg Oral QHS Adefeso, Oladapo, DO       [MAR Hold] ondansetron  (ZOFRAN ) tablet 4 mg  4 mg Oral Q6H PRN Adefeso, Oladapo, DO       Or   [MAR Hold] ondansetron  (ZOFRAN ) injection 4 mg  4 mg Intravenous Q6H PRN Adefeso, Oladapo, DO       [MAR Hold] pantoprazole (PROTONIX) injection 40 mg  40 mg Intravenous Q12H Melvenia Motto, MD   40 mg at 10/20/23 0802    Allergies as of 10/19/2023   (No Known Allergies)    Past Medical History:  Diagnosis Date   Acute bronchitis with COPD (HCC)    Allergic rhinitis  Anxiety    Asthma    Cervical disc herniation    Chest pain    Chronic pain    COPD (chronic obstructive pulmonary disease) (HCC)    Edema    GERD (gastroesophageal reflux disease)    HTN (hypertension)    Hyperlipidemia    Lumbago    Neck pain    Osteoporosis    Rosacea    Sleep apnea    uses CPAP    Past Surgical History:  Procedure Laterality Date   ABDOMINAL HYSTERECTOMY     BIOPSY  06/18/2021   Procedure: BIOPSY;  Surgeon: Shaaron Lamar HERO, MD;  Location: [facility];  Service: Endoscopy;;   CHOLECYSTECTOMY     1990s   COLONOSCOPY  12/05/2012   [place]; Dr. Donnel; sigmoid diverticulosis, otherwise normal exam.  Recommended repeat in 10 years.   COLONOSCOPY WITH PROPOFOL  N/A 06/18/2021   Procedure: COLONOSCOPY WITH PROPOFOL ;  Surgeon: Shaaron Lamar HERO, MD;  Location: [facility];  Service: Endoscopy;  Laterality: N/A;  2:45pm, random colon bx   ESOPHAGOGASTRODUODENOSCOPY (EGD) WITH PROPOFOL  N/A 08/13/2019   Procedure: ESOPHAGOGASTRODUODENOSCOPY (EGD) WITH PROPOFOL ;  Surgeon: Shaaron Lamar HERO, MD;  Normal examined esophagus s/p dilations, numerous gastric  erosions in the antrum and gastric body s/p biopsy, patent pylorus, bulbar erosions, otherwise D1 and D2 appeared normal. Pathology with gastric antral and oxyntic mucosa, negative for H. pylori.    MALONEY DILATION N/A 08/13/2019   Procedure: AGAPITO DILATION;  Surgeon: Shaaron Lamar HERO, MD;  Location: [facility];  Service: Endoscopy;  Laterality: N/A;   MOUTH SURGERY     TUBAL LIGATION      Family History  Problem Relation Age of Onset   COPD Mother    Cancer Father    Breast cancer Sister    Lung cancer Sister    Multiple sclerosis Sister    Colon cancer Neg Hx        family history is limited.     Social History   Socioeconomic History   Marital status: Married    Spouse name: Not on file   Number of children: Not on file   Years of education: Not on file   Highest education level: Not on file  Occupational History   Not on file  Tobacco Use   Smoking status: Former    Current packs/day: 0.00    Average packs/day: 1 pack/day for 15.0 years (15.0 ttl pk-yrs)    Types: Cigarettes    Start date: 01/05/1976    Quit date: 01/05/1991    Years since quitting: 32.8    Passive exposure: Past   Smokeless tobacco: Never  Vaping Use   Vaping status: Never Used  Substance and Sexual Activity   Alcohol  use: Yes    Comment: occasional   Drug use: Never   Sexual activity: Not Currently  Other Topics Concern   Not on file  Social History Narrative   Not on file   Social Drivers of Health   Financial Resource Strain: High Risk (06/28/2023)   Received from [facility]   Overall Financial Resource Strain (CARDIA)    How hard is it for you to pay for the very basics like food, housing, medical care, and heating?: Very hard  Food Insecurity: Food Insecurity Present (10/19/2023)   Hunger Vital Sign    Worried About Running Out of Food in the Last Year: Often true    Ran Out of Food in the Last Year: Often true  Transportation Needs: No Transportation Needs (10/19/2023)    PRAPARE - Administrator, Civil Service (Medical): No    Lack of Transportation (Non-Medical): No  Physical Activity: Sufficiently Active (06/28/2023)   Received from [facility]   Exercise Vital Sign    On average, how many days per week do you engage in moderate to strenuous exercise (like a brisk walk)?: 5 days    On average, how many minutes do you engage in exercise at this level?: 60 min  Stress: Stress Concern Present (06/28/2023)   Received from [facility] Occup[facility] - Occupational Stress Questionnaire    Do you feel stress - tense, restless, nervous, or anxious, or unable to sleep at night because your mind is troubled all the time - these days?: Very much  Social Connections: Moderately Integrated (06/28/2023)   Received from [facility]   Social Connection and Isolation Panel    In a typical week, how many times do you talk on the phone with family, friends, or neighbors?: More than three times a week    How often do you get together with friends or relatives?: More than three times a week    How often do you attend church or religious services?: More than 4 times per year    Do you belong to any clubs or organizations such as church groups, unions, fraternal or athletic groups, or school groups?: No    How often do you attend meetings of the clubs or organizations you belong to?: Never    Are you married, widowed, divorced, separated, never married, or living with a partner?: Married  Intimate Partner Violence: Not At Risk (10/19/2023)   Humiliation, Afraid, Rape, and Kick questionnaire    Fear of Current or Ex-Partner: No    Emotionally Abused: No    Physically Abused: No    Sexually Abused: No     Review of System:   General: Negative for anorexia, unintentional weight loss, fever, chills, fatigue, weakness. Eyes: Negative for vision changes.  ENT: Negative for hoarseness, nasal congestion. See hpi CV: Negative for  chest pain, angina, palpitations, dyspnea on exertion, peripheral edema.  Respiratory: Negative for dyspnea at rest, +dyspnea on exertion, cough, sputum, wheezing.  GI: See history of present illness. GU:  Negative for dysuria, hematuria, urinary incontinence, urinary frequency, nocturnal urination.  MS: Negative for joint pain, low back pain.  Derm: Negative for rash or itching.  Neuro: Negative for weakness, abnormal sensation, seizure, frequent headaches, memory loss, confusion.  Psych: Negative for anxiety, depression, suicidal ideation, hallucinations.  Endo: Negative for unusual weight change.  Heme: Negative for bruising or bleeding. Allergy: Negative for rash or hives.      Physical Examination:   Vital signs in last 24 hours: Temp:  [97.4 F (36.3 C)-98.4 F (36.9 C)] 98.3 F (36.8 C) (10/16 0919) Pulse Rate:  [67-96] 72 (10/16 0919) Resp:  [16-20] 16 (10/16 0919) BP: (110-153)/(37-78) 128/45 (10/16 0352) SpO2:  [97 %-100 %] 100 % (10/16 0919) Weight:  [90.7 kg] 90.7 kg (10/15 1654) Last BM Date : 10/18/23  General: Well-nourished, well-developed in no acute distress.  Head: Normocephalic, atraumatic.   Eyes: Conjunctiva pink, no icterus. Mouth: Oropharyngeal mucosa moist and pink , no lesions erythema or exudate. Neck: Supple without thyromegaly, masses, or lymphadenopathy.  Lungs: Clear to auscultation bilaterally.  Heart: Regular rate and rhythm, no murmurs rubs or gallops.  Abdomen: Bowel sounds are normal, nontender, nondistended,  no hepatosplenomegaly or masses, no abdominal bruits or hernia , no rebound or guarding.   Rectal: not performed Extremities: 1+ bilateral lower extremity edema, clubbing, deformity.  Neuro: Alert and oriented x 4 , grossly normal neurologically.  Skin: Warm and dry, no rash or jaundice.   Psych: Alert and cooperative, normal mood and affect.        Intake/Output from previous day: 10/15 0701 - 10/16 0700 In: 1208.8 [Blood:625.4; IV  Piggyback:583.3] Out: -  Intake/Output this shift: No intake/output data recorded.  Lab Results:   CBC Recent Labs    10/19/23 1726 10/19/23 2149 10/20/23 0407  WBC 5.6  --  6.3  HGB 5.7* 6.4* 7.6*  HCT 19.7* 21.3* 24.9*  MCV 111.3*  --  98.4  PLT 227  --  207   BMET Recent Labs    10/19/23 1726 10/20/23 0407  NA 142 143  K 3.9 3.8  CL 107 109  CO2 27 27  GLUCOSE 104* 83  BUN 18 13  CREATININE 0.72 0.66  CALCIUM 9.1 8.7*   LFT Recent Labs    10/19/23 1726 10/20/23 0407  BILITOT <0.2 0.3  ALKPHOS 48 42  AST 20 17  ALT 12 12  PROT 5.8* 5.3*  ALBUMIN 3.7 3.4*    Lipase No results for input(s): LIPASE in the last 72 hours.  PT/INR Recent Labs    10/19/23 1823  LABPROT 12.3  INR 0.9     Hepatitis Panel No results for input(s): HEPBSAG, HCVAB, HEPAIGM, HEPBIGM in the last 72 hours.   Imaging Studies:   CT Head Wo Contrast Result Date: 10/19/2023 CLINICAL DATA:  Headache, increasing frequency or severity. EXAM: CT HEAD WITHOUT CONTRAST TECHNIQUE: Contiguous axial images were obtained from the base of the skull through the vertex without intravenous contrast. RADIATION DOSE REDUCTION: This exam was performed according to the departmental dose-optimization program which includes automated exposure control, adjustment of the mA and/or kV according to patient size and/or use of iterative reconstruction technique. COMPARISON:  None Available. FINDINGS: Brain: There is no evidence of an acute infarct, intracranial hemorrhage, mass, midline shift, or extra-axial fluid collection. Cerebral volume is normal. The ventricles are normal in size. Vascular: Calcified atherosclerosis at the skull base. No hyperdense vessel. Skull: No fracture or suspicious lesion. Sinuses/Orbits: Visualized paranasal sinuses and mastoid air cells are clear. Unremarkable orbits. Other: None. IMPRESSION: Unremarkable CT appearance of the brain. Electronically Signed   By: Dasie Hamburg M.D.   On: 10/19/2023 19:09   DG Chest Portable 1 View Result Date: 10/19/2023 CLINICAL DATA:  Worsening headache with recent acid reflux. EXAM: PORTABLE CHEST 1 VIEW COMPARISON:  July 03, 2018 FINDINGS: The heart size and mediastinal contours are within normal limits. The lungs are hyperinflated. Mild atelectatic changes are seen within the bilateral lung bases. No focal consolidation, pleural effusion or pneumothorax is identified. The visualized skeletal structures are unremarkable. IMPRESSION: COPD with mild bibasilar atelectasis. Electronically Signed   By: Suzen Dials M.D.   On: 10/19/2023 18:53  [4 week]  Assessment:   63 yo female with sleep apnea, HTN, HLD, COPD, GERD, osteoarthritis presenting to ED with severe headache, ringing in the ears, palpitations, and progressive generalized weakness/DOE. She was noted to have acute change in Hgb from 13.3 in June to 5.7 on admission. Stool hemoccult positive.   GI bleed/symptomatic anemia: Acute change in Hgb from 13.3 to 5.7 since June. Reported possible melena about one months ago. Stools darker. Stool hemoccult positive. MCV initially elevated,  B12 pending. Folate normal. Will add on iron studies. Her colonoscopy is relatively recent 07/2021. I suspect UGI bleeding in setting of NSAID/ASA use. PUD remains high on differential.  -Hgb up appropriately to 7.6 after 2 units of prbcs   Esophageal dysphagia: Prior esophageal dilation in 2021 subjectively beneficial with normal appearing esophageal. Patient with recurrent solid food dysphagia, but with milder symptoms. Consider esophageal dilation if able at time of EGD but patient aware that may not be appropriate based on findings in setting of GI bleeding.    Plan:   Iron studies (add to pre-transfusion labs) Follow up B12 level NPO EGD with possible esophageal dilation if appropriate today.  I have discussed the risks, alternatives, benefits with regards to but not limited to  the risk of reaction to medication, bleeding, infection, perforation and the patient is agreeable to proceed. Written consent to be obtained. Pantoprazole 40mg  BID.    LOS: 0 days   We would like to thank you for the opportunity to participate in the care of Korene W Nolet.  Sonny RAMAN. Ezzard RIGGERS [facility] [phone] 10/16/20259:21 AM

## 2023-10-20 NOTE — Care Management Obs Status (Signed)
 MEDICARE OBSERVATION STATUS NOTIFICATION   Patient Details  Name: Jenna Rivera MRN: [id number] Date of Birth: Nov 09, 1960   Medicare Observation Status Notification Given:  Yes    Duwaine LITTIE Ada 10/20/2023, 4:54 PM

## 2023-10-20 NOTE — TOC CM/SW Note (Signed)
 Transition of Care [facility]) - Inpatient Brief Assessment   Patient Details  Name: Jenna Rivera MRN: [id number] Date of Birth: Oct 02, 1960  [facility] of [facility] CM/SW Contact:    [name] Phone Number: 10/20/2023, 9:19 AM   Clinical Narrative:  Inpatient Care Management (ICM) has reviewed patient and no other ICM needs have been identified at this time. We will continue to monitor patient advancement through interdisciplinary progression rounds. If new patient transition needs arise, please place a ICM consult.   Transition of Care Asessment: Insurance and Status: Insurance coverage has been reviewed Patient has primary care physician: Yes Home environment has been reviewed: Single Family Home Prior level of function:: Independent Prior/Current Home Services: No current home services Social Drivers of Health Review: SDOH reviewed interventions complete Readmission risk has been reviewed: Yes Transition of care needs: no transition of care needs at this time

## 2023-10-20 NOTE — Progress Notes (Signed)
 PROGRESS NOTE    [name]  FMW:[id number] DOB: 1960-07-21 DOA: 10/19/2023 PCP: [name], FNP   Brief Narrative:  Jenna Rivera is a 63 y.o. female with medical history significant of hypertension, COPD, osteoarthritis, anxiety and obesity who presents to the emergency department due to headache, ringing in the ears which started on Friday (10/10) and about 1 month of generalized weakness and shortness of breath on exertion, has been taking meloxicam, has been having darker stools for past few weeks.  Vital signs stable in the ER.  Laboratory work is significantly abnormal with hemoglobin of 5.7 which was 13 in June 2025.  Patient initiated on IV Protonix and was transfused with 2 unit PRBC.  Admitted for evaluation/management of GI bleeding.  Assessment & Plan:   Principal Problem:   GI bleed Active Problems:   COPD (chronic obstructive pulmonary disease) (HCC)   OSA on CPAP   Gastroesophageal reflux disease   Symptomatic anemia   Obesity, Class II, BMI 35-39.9   Essential hypertension   Osteoarthritis  GI bleed/ABLA H/H= 5.7/19.7, this was 13.3/39.5 on 06/29/2023 (Care Everywhere) Hemoccult was positive .2 unit PRBC transfusion, subsequent appropriate rise in hemoglobin to 7.6.   Continue IV Protonix 40 mg twice daily EGD completed today. FINDINGS: - Normal esophagus.  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  Clip (MR safe) was placed.  Injected.  - Normal stomach.  Biopsied.  - Normal examined duodenum.   Will monitor overnight, repeat hemoglobin check in the AM.  Full liquid diet. Possible discharge tomorrow, follow-up path outpatient    Macrocytic anemia MCV 111.3--> 98 overnight Vitamin B12 and folate levels sent  Headaches/fatigue: Likely secondary to GI bleeding   COPD (not in acute exacerbation) Continue albuterol , fluticasone, montelukast  OA  Continue Tylenol  as needed Hold  meloxicam in the setting of GI bleed  Essential hypertension Continue lisinopril  GERD  Continue pantoprazole, currently IV in the setting of GI bleeding   OSA on CPAP Continue CPAP   Obesity class II (BMI 39.06) Diet and lifestyle modification     DVT prophylaxis: SCDs, no pharmacological prophylaxis due to GI bleeding   Code Status: Full code   Family Communication:    Consults: GI, communicated with Dr. Eartha   severity of Illness: The appropriate patient status for this patient is OBSERVATION. Observation status is judged to be reasonable and necessary in order to provide the required intensity of service to ensure the patient's safety. The patient's presenting symptoms, physical exam findings, and initial radiographic and laboratory data in the context of their medical condition is felt to place them at decreased risk for further clinical deterioration. Furthermore, it is anticipated that the patient will be medically stable for discharge from the hospital within 2 midnights of admission.    Subjective:  Patient seen and examined after his EGD.  Vital signs are stable.  Discussed EGD findings.  She denies abdominal pain nausea vomiting.  She is ambulating inside the room without any symptoms.  Objective: Vitals:   10/20/23 1006 10/20/23 1020 10/20/23 1046 10/20/23 1320  BP:  (!) 150/75 (!) 137/57 (!) 128/56  Pulse: 81 68  69  Resp: 20 16  16   Temp:  97.9 F (36.6 C) 97.8 F (36.6 C) 98.9 F (37.2 C)  TempSrc:    Oral  SpO2: 100% 100% 100% 97%  Weight:      Height:        Intake/Output  Summary (Last 24 hours) at 10/20/2023 1418 Last data filed at 10/20/2023 0951 Gross per 24 hour  Intake 1408.75 ml  Output --  Net 1408.75 ml   Filed Weights   10/19/23 1654  Weight: 90.7 kg    Examination:  General: Alert, oriented not in any acute distress Chest: Clear CVS: S1, S2, no murmur, regular rhythm Abdomen: Soft, nontender Extremities: No edema  Data  Reviewed: I have personally reviewed following labs and imaging studies  CBC: Recent Labs  Lab 10/19/23 1726 10/19/23 2149 10/20/23 0407  WBC 5.6  --  6.3  NEUTROABS 3.3  --   --   HGB 5.7* 6.4* 7.6*  HCT 19.7* 21.3* 24.9*  MCV 111.3*  --  98.4  PLT 227  --  207   Basic Metabolic Panel: Recent Labs  Lab 10/19/23 1726 10/19/23 1823 10/20/23 0407  NA 142  --  143  K 3.9  --  3.8  CL 107  --  109  CO2 27  --  27  GLUCOSE 104*  --  83  BUN 18  --  13  CREATININE 0.72  --  0.66  CALCIUM 9.1  --  8.7*  MG  --  2.3  --   PHOS  --   --  3.4   GFR: Estimated Creatinine Clearance: 71.3 mL/min (by C-G formula based on SCr of 0.66 mg/dL). Liver Function Tests: Recent Labs  Lab 10/19/23 1726 10/20/23 0407  AST 20 17  ALT 12 12  ALKPHOS 48 42  BILITOT <0.2 0.3  PROT 5.8* 5.3*  ALBUMIN 3.7 3.4*   No results for input(s): LIPASE, AMYLASE in the last 168 hours. No results for input(s): AMMONIA in the last 168 hours. Coagulation Profile: Recent Labs  Lab 10/19/23 1823  INR 0.9   Cardiac Enzymes: No results for input(s): CKTOTAL, CKMB, CKMBINDEX, TROPONINI in the last 168 hours. BNP (last 3 results) No results for input(s): PROBNP in the last 8760 hours. HbA1C: No results for input(s): HGBA1C in the last 72 hours. CBG: No results for input(s): GLUCAP in the last 168 hours. Lipid Profile: No results for input(s): CHOL, HDL, LDLCALC, TRIG, CHOLHDL, LDLDIRECT in the last 72 hours. Thyroid  Function Tests: No results for input(s): TSH, T4TOTAL, FREET4, T3FREE, THYROIDAB in the last 72 hours. Anemia Panel: Recent Labs    10/20/23 0407 10/20/23 0536  VITAMINB12 693  --   FOLATE >20.0  --   FERRITIN  --  34  TIBC  --  329  IRON  --  25*   Sepsis Labs: No results for input(s): PROCALCITON, LATICACIDVEN in the last 168 hours.  No results found for this or any previous visit (from the past 240 hours).        Radiology Studies: CT Head Wo Contrast Result Date: 10/19/2023 CLINICAL DATA:  Headache, increasing frequency or severity. EXAM: CT HEAD WITHOUT CONTRAST TECHNIQUE: Contiguous axial images were obtained from the base of the skull through the vertex without intravenous contrast. RADIATION DOSE REDUCTION: This exam was performed according to the departmental dose-optimization program which includes automated exposure control, adjustment of the mA and/or kV according to patient size and/or use of iterative reconstruction technique. COMPARISON:  None Available. FINDINGS: Brain: There is no evidence of an acute infarct, intracranial hemorrhage, mass, midline shift, or extra-axial fluid collection. Cerebral volume is normal. The ventricles are normal in size. Vascular: Calcified atherosclerosis at the skull base. No hyperdense vessel. Skull: No fracture or suspicious lesion. Sinuses/Orbits: Visualized paranasal sinuses  and mastoid air cells are clear. Unremarkable orbits. Other: None. IMPRESSION: Unremarkable CT appearance of the brain. Electronically Signed   By: Dasie Hamburg M.D.   On: 10/19/2023 19:09   DG Chest Portable 1 View Result Date: 10/19/2023 CLINICAL DATA:  Worsening headache with recent acid reflux. EXAM: PORTABLE CHEST 1 VIEW COMPARISON:  July 03, 2018 FINDINGS: The heart size and mediastinal contours are within normal limits. The lungs are hyperinflated. Mild atelectatic changes are seen within the bilateral lung bases. No focal consolidation, pleural effusion or pneumothorax is identified. The visualized skeletal structures are unremarkable. IMPRESSION: COPD with mild bibasilar atelectasis. Electronically Signed   By: Suzen Dials M.D.   On: 10/19/2023 18:53        Scheduled Meds:  montelukast  10 mg Oral QHS   pantoprazole  40 mg Intravenous Q12H   Continuous Infusions:        Jimmy Plessinger, MD [facility] 10/20/2023, 2:18 PM

## 2023-10-20 NOTE — Plan of Care (Signed)

## 2023-10-20 NOTE — Progress Notes (Signed)
 Pt declined CPAP at this time. Pt wears nasal pillows at home and can not tolerate a nasal mask. RT will continue to monitor

## 2023-10-20 NOTE — Discharge Instructions (Signed)
 Food Agency Name: [facility] Address: [address], [place], [place] [address] Phone: [phone] Website: www.adtsrc.org Services Offered: Meals on [employer] and Meals with Friends.   Home care, at home assisted living, volunteer services, Center  for Active Retirement, transportation  Agency Name: [facility] Address: Sites vary. Must call first. Food Pantry location: [address], [place], [place] [address] [employer] Phone: [phone] Website: none Services Offered: Museum/gallery curator, utility assistance if funds available Careers adviser for all of [place], [employer], [employer], Office manager and [employer] for [employer] area only) Walk-in  current Id and current address verification required.  Wed-Thurs: 9:30-12:00 Agency Name: [facility] Address: [address] [place], [place] [address]  Phone: [phone] or [phone] Website: none Services Offered: Food assistance Agency Name: [employer] [employer] Address: [address], [place], [place] [address]  Phone: [phone] or [phone]  Website: none Services Offered: Serves 1 hot meal a day at 11:00 am Monday-Sunday and 5 pm  on the second and fourth Sunday of each month Agency Name: [facility] Department of Health and [facility]  Services/Social Services  Address: [phone], [place], [place] [address] Phone: [phone] Website: [email or web address].[email or web address]   [email or web address]://[email or web address] Services Offered: Sales executive, [facility] program Agency Name: [facility] Address: [address] [place], [place] [address] Phone: [phone] Website: www.[email or web address] Services Offered: Food pantry Tuesday, Wednesday and Thursday 9am-11:30am  (need appointment) and health clinic (9:00am-11:00am)  Agency Name: [employer] [facility] Address: [address], [place] / [address] [place] Phone: [phone] [place] / [phone] [place] Website: OpinionTrades.tn NetworkAffair.co.za Services Offered: Civil Service fast streamer, food pantry, soup kitchen ([place]) emergency financial  assistance, thrift stores, showers & hygiene products ([place]),  Christmas Assistance, spiritual help   Rent/Utilities/Housing  Agency: [place]  Clinical biochemist Address: [address] [address] [place], [place] [address]  [address] [place], [place] [address]  Phone Number: [phone] or [phone] For the hearing-impaired - Dial 711 for Relay [place] Services Agency Name: [facility]. [facility] Address: [address], [place], [place] [address] Phone: [phone] Website: [email or web address].co.[email or web address]  Services Offered: Temporary financial assistance, subsidized housing, and utility  assistance  Agency Name: [employer] [facility]  Address: [address] [place], [place] [address] Phone: [phone] ext. [phone] Email: Contact: info@newrha .org Website: FootballPromos.co.nz Services Offered: Subsidized apartment rent based on income.  Agency Name: [facility] Address: [facility], [address] [place], [place] Phone: [phone]  Website: www.ccmeden.org Services Offered: Museum/gallery curator, utility assistance Technical brewer for all of  [facility], [employer], [employer], [employer]  December 11, 2019 [address] for [place] area only), rent assistance.  Agency Name: [facility] Address: [address] [place], [place], [place] [address] / [address],  [place] Phone: [phone] [place] / [phone] [place] Website: OpinionTrades.tn NetworkAffair.co.za Services Offered: Civil Service fast streamer, food, showers, hygiene products utility payment  assistance, thrift shops, rental assistance, Support Groups Agency Name: [facility]  Address: [address] [place], [place] [address]  Phone: [phone] / [phone] Website: https://www.daymarkrecovery.org/ Services Offered: Support groups for Bipolar, substance abuse, anger  management, panic depression and anxiety. Mobile crisis unit,  outpatient therapy, substance abuse treatment. Agency Name: [facility]  Address: [address], [place], [place] [address]  Phone: [phone] Website: www.helpinc-centeragainstviolence.org Services Offered: Support groups for domestic violence or sexual assault, support  group for elderly women and domestic assault.

## 2023-10-20 NOTE — Op Note (Addendum)
 [facility] Patient Name: Jenna Rivera Procedure Date: 10/20/2023 9:18 AM MRN: [id number] Date of Birth: May 04, 1960 Attending MD: Toribio Fortune , , [id number] CSN: [id number] Age: 63 Admit Type: Outpatient Procedure:                Upper GI endoscopy Indications:              Iron deficiency anemia, Melena Providers:                Toribio Fortune, Devere Lodge, Bascom Blush Referring MD:              Medicines:                Monitored Anesthesia Care Complications:            No immediate complications. Estimated Blood Loss:     Estimated blood loss: none. Procedure:                Pre-Anesthesia Assessment:                           - Prior to the procedure, a History and Physical                            was performed, and patient medications, allergies                            and sensitivities were reviewed. The patient's                            tolerance of previous anesthesia was reviewed.                           - The risks and benefits of the procedure and the                            sedation options and risks were discussed with the                            patient. All questions were answered and informed                            consent was obtained.                           - ASA Grade Assessment: III - A patient with severe                            systemic disease.                           After obtaining informed consent, the endoscope was                            passed under direct vision. Throughout the                            procedure, the patient's blood  pressure, pulse, and                            oxygen saturations were monitored continuously. The                            [id number] ([id number]) Upper was introduced through                            the mouth, and advanced to the third part of                            duodenum. The upper GI endoscopy was accomplished                            without difficulty. The  patient tolerated the                            procedure well. Scope In: 9:39:25 AM Scope Out: 9:51:26 AM Total Procedure Duration: 0 hours 12 minutes 1 second  Findings:      The examined esophagus was normal.      Localized hemorrhagic mucosa with bleeding was found at the       gastroesophageal junction, possible Dieulafoy lesion?SABRA For hemostasis,       one hemostatic clip was successfully placed (MR safe). Clip       manufacturer: [employer]. There was no bleeding at the end of the       procedure. Area was successfully injected with 2 mL of a 0.1 mg/mL       solution of epinephrine  for hemostasis.      The entire examined stomach was normal. Biopsies were taken with a cold       forceps for Helicobacter pylori testing.      The examined duodenum was normal. Impression:               - Normal esophagus.                           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 Clip (MR safe) was placed. Injected.                           - Normal stomach. Biopsied.                           - Normal examined duodenum. Moderate Sedation:      Per Anesthesia Care Recommendation:           - Return patient to hospital ward for ongoing care.                           - Full liquid diet.                           -  Await pathology results.                           - Use Protonix (pantoprazole) 40 mg PO BID.                           - Daily H/H                           - No high dose aspirin, ibuprofen, naproxen, or                            other non-steroidal anti-inflammatory drugs. Procedure Code(s):        --- Professional ---                           [phone], 59, Esophagogastroduodenoscopy, flexible,                            transoral; with control of bleeding, any method                           43239, Esophagogastroduodenoscopy, flexible,                             transoral; with biopsy, single or multiple Diagnosis Code(s):        --- Professional ---                           K92.2, Gastrointestinal hemorrhage, unspecified                           D50.9, Iron deficiency anemia, unspecified                           K92.1, Melena (includes Hematochezia) CPT copyright 2022 American Medical Association. All rights reserved. The codes documented in this report are preliminary and upon coder review may  be revised to meet current compliance requirements. Toribio Fortune, MD Toribio Fortune,  10/20/2023 10:01:50 AM This report has been signed electronically. Number of Addenda: 0

## 2023-10-20 NOTE — Plan of Care (Signed)
   Problem: Education: Goal: Knowledge of General Education information will improve Description Including pain rating scale, medication(s)/side effects and non-pharmacologic comfort measures Outcome: Progressing   Problem: Health Behavior/Discharge Planning: Goal: Ability to manage health-related needs will improve Outcome: Progressing

## 2023-10-20 NOTE — Progress Notes (Incomplete)
 PROGRESS NOTE    Jenna Rivera  FMW:[id number] DOB: [birth year] DOA: 10/19/2023 PCP: [name], FNP   Brief Narrative:  Jenna Rivera is a 63 y.o. female with medical history significant of hypertension, COPD, osteoarthritis, anxiety and obesity who presents to the emergency department due to headache, ringing in the ears which started on Friday (10/10) and about 1 month of generalized weakness and shortness of breath on exertion, has been taking meloxicam, has been having darker stools for past few weeks.  Vital signs stable in the ER.  Laboratory work is significantly abnormal with hemoglobin of 5.7 which was 13 in June 2025.  Patient initiated on IV Protonix and was transfused with 2 unit PRBC.  Admitted for evaluation/management of GI bleeding.  Assessment & Plan:   Principal Problem:   GI bleed Active Problems:   COPD (chronic obstructive pulmonary disease) (HCC)   OSA on CPAP   Gastroesophageal reflux disease   Macrocytic anemia   Obesity, Class II, BMI 35-39.9   Essential hypertension   Osteoarthritis  GI bleed/ABLA H/H= 5.7/19.7, this was 13.3/39.5 on 06/29/2023 (Care Everywhere) Hemoccult was positive .2 unit PRBC transfusion, subsequent appropriate rise in hemoglobin to 7.6.   Continue IV Protonix 40 mg twice daily EGD completed today. FINDINGS: - Normal esophagus.  - Active bleeding at GE junction, possible Dieulafoy lesion?Jenna Rivera manufacturer: [employer].  Clip (MR safe) was placed.  Injected.  - Normal stomach.  Biopsied.  - Normal examined duodenum.   Will monitor overnight, repeat hemoglobin check in the AM.  Full liquid diet. Possible discharge tomorrow, follow-up path outpatient    Macrocytic anemia MCV 111.3--> 98 overnight Vitamin B12 and folate levels sent  Headaches/fatigue: Likely secondary to GI bleeding   COPD (not in acute exacerbation) Continue albuterol , fluticasone, montelukast  OA  Continue Tylenol  as needed Hold  meloxicam in the setting of GI bleed  Essential hypertension Continue lisinopril  GERD  Continue pantoprazole, currently IV in the setting of GI bleeding   OSA on CPAP Continue CPAP   Obesity class II (BMI 39.06) Diet and lifestyle modification     DVT prophylaxis: SCDs, no pharmacological prophylaxis due to GI bleeding   Code Status: Full code   Family Communication:    Consults: GI, communicated with Dr. Eartha   severity of Illness: The appropriate patient status for this patient is OBSERVATION. Observation status is judged to be reasonable and necessary in order to provide the required intensity of service to ensure the patient's safety. The patient's presenting symptoms, physical exam findings, and initial radiographic and laboratory data in the context of their medical condition is felt to place them at decreased risk for further clinical deterioration. Furthermore, it is anticipated that the patient will be medically stable for discharge from the hospital within 2 midnights of admission.    Subjective:  Patient seen and examined after his EGD.  Vital signs are stable.  Discussed EGD findings.  Objective: Vitals:   10/19/23 2220 10/19/23 2317 10/20/23 0038 10/20/23 0352  BP: (!) 110/42 135/62 (!) 149/39 (!) 128/45  Pulse: 72 72 74 67  Resp: 18 18 18 18   Temp: 98.1 F (36.7 C) (!) 97.4 F (36.3 C) 98.2 F (36.8 C) 98.4 F (36.9 C)  TempSrc: Oral Axillary Oral Oral  SpO2: 100% 100% 97% 99%  Weight:      Height:        Intake/Output Summary (Last 24 hours) at 10/20/2023 0756 Last data filed at 10/20/2023 [phone]  Gross per 24 hour  Intake 1208.75 ml  Output --  Net 1208.75 ml   Filed Weights   10/19/23 1654  Weight: 90.7 kg    Examination:  General: Alert, oriented not in any acute distress Chest: Clear CVS: S1, S2, no murmur, regular rhythm Abdomen: Soft, nontender Extremities: No edema  Data Reviewed: I have personally reviewed following labs  and imaging studies  CBC: Recent Labs  Lab 10/19/23 1726 10/19/23 2149 10/20/23 0407  WBC 5.6  --  6.3  NEUTROABS 3.3  --   --   HGB 5.7* 6.4* 7.6*  HCT 19.7* 21.3* 24.9*  MCV 111.3*  --  98.4  PLT 227  --  207   Basic Metabolic Panel: Recent Labs  Lab 10/19/23 1726 10/19/23 1823 10/20/23 0407  NA 142  --  143  K 3.9  --  3.8  CL 107  --  109  CO2 27  --  27  GLUCOSE 104*  --  83  BUN 18  --  13  CREATININE 0.72  --  0.66  CALCIUM 9.1  --  8.7*  MG  --  2.3  --   PHOS  --   --  3.4   GFR: Estimated Creatinine Clearance: 71.3 mL/min (by C-G formula based on SCr of 0.66 mg/dL). Liver Function Tests: Recent Labs  Lab 10/19/23 1726 10/20/23 0407  AST 20 17  ALT 12 12  ALKPHOS 48 42  BILITOT <0.2 0.3  PROT 5.8* 5.3*  ALBUMIN 3.7 3.4*   No results for input(s): LIPASE, AMYLASE in the last 168 hours. No results for input(s): AMMONIA in the last 168 hours. Coagulation Profile: Recent Labs  Lab 10/19/23 1823  INR 0.9   Cardiac Enzymes: No results for input(s): CKTOTAL, CKMB, CKMBINDEX, TROPONINI in the last 168 hours. BNP (last 3 results) No results for input(s): PROBNP in the last 8760 hours. HbA1C: No results for input(s): HGBA1C in the last 72 hours. CBG: No results for input(s): GLUCAP in the last 168 hours. Lipid Profile: No results for input(s): CHOL, HDL, LDLCALC, TRIG, CHOLHDL, LDLDIRECT in the last 72 hours. Thyroid  Function Tests: No results for input(s): TSH, T4TOTAL, FREET4, T3FREE, THYROIDAB in the last 72 hours. Anemia Panel: Recent Labs    10/20/23 0407  FOLATE >20.0   Sepsis Labs: No results for input(s): PROCALCITON, LATICACIDVEN in the last 168 hours.  No results found for this or any previous visit (from the past 240 hours).       Radiology Studies: CT Head Wo Contrast Result Date: 10/19/2023 CLINICAL DATA:  Headache, increasing frequency or severity. EXAM: CT HEAD WITHOUT  CONTRAST TECHNIQUE: Contiguous axial images were obtained from the base of the skull through the vertex without intravenous contrast. RADIATION DOSE REDUCTION: This exam was performed according to the departmental dose-optimization program which includes automated exposure control, adjustment of the mA and/or kV according to patient size and/or use of iterative reconstruction technique. COMPARISON:  None Available. FINDINGS: Brain: There is no evidence of an acute infarct, intracranial hemorrhage, mass, midline shift, or extra-axial fluid collection. Cerebral volume is normal. The ventricles are normal in size. Vascular: Calcified atherosclerosis at the skull base. No hyperdense vessel. Skull: No fracture or suspicious lesion. Sinuses/Orbits: Visualized paranasal sinuses and mastoid air cells are clear. Unremarkable orbits. Other: None. IMPRESSION: Unremarkable CT appearance of the brain. Electronically Signed   By: Dasie Hamburg M.D.   On: 10/19/2023 19:09   DG Chest Portable 1 View Result Date: 10/19/2023 CLINICAL  DATA:  Worsening headache with recent acid reflux. EXAM: PORTABLE CHEST 1 VIEW COMPARISON:  July 03, 2018 FINDINGS: The heart size and mediastinal contours are within normal limits. The lungs are hyperinflated. Mild atelectatic changes are seen within the bilateral lung bases. No focal consolidation, pleural effusion or pneumothorax is identified. The visualized skeletal structures are unremarkable. IMPRESSION: COPD with mild bibasilar atelectasis. Electronically Signed   By: Suzen Dials M.D.   On: 10/19/2023 18:53        Scheduled Meds:  montelukast  10 mg Oral QHS   pantoprazole  40 mg Intravenous Q12H   Continuous Infusions:        Jeff Frieden, MD [facility] 10/20/2023, 7:56 AM

## 2023-10-20 NOTE — Anesthesia Preprocedure Evaluation (Signed)
 Anesthesia Evaluation  Patient identified by MRN, date of birth, ID band Patient awake    Reviewed: Allergy & Precautions, H&P , NPO status , Patient's Chart, lab work & pertinent test results, reviewed documented beta blocker date and time   Airway Mallampati: II  TM Distance: >3 FB Neck ROM: full    Dental no notable dental hx.    Pulmonary asthma , sleep apnea , COPD, former smoker   Pulmonary exam normal breath sounds clear to auscultation       Cardiovascular Exercise Tolerance: Good hypertension,  Rhythm:regular Rate:Normal     Neuro/Psych   Anxiety     negative neurological ROS  negative psych ROS   GI/Hepatic Neg liver ROS,GERD  ,,  Endo/Other    Class 4 obesity  Renal/GU negative Renal ROS  negative genitourinary   Musculoskeletal   Abdominal   Peds  Hematology  (+) Blood dyscrasia, anemia   Anesthesia Other Findings   Reproductive/Obstetrics negative OB ROS                              Anesthesia Physical Anesthesia Plan  ASA: 4 and emergent  Anesthesia Plan: General   Post-op Pain Management:    Induction:   PONV Risk Score and Plan: Propofol  infusion  Airway Management Planned:   Additional Equipment:   Intra-op Plan:   Post-operative Plan:   Informed Consent: I have reviewed the patients History and Physical, chart, labs and discussed the procedure including the risks, benefits and alternatives for the proposed anesthesia with the patient or authorized representative who has indicated his/her understanding and acceptance.     Dental Advisory Given  Plan Discussed with: CRNA  Anesthesia Plan Comments:         Anesthesia Quick Evaluation

## 2023-10-20 NOTE — Progress Notes (Signed)
 10/20/2023 10:48 AM -----------------------------------------------------------[facility]--------------------------------------------------- D(Data) A(Action) R(response)     Data: Discharge Readiness Assessment EDD listed for tomorrow 10/21/2023     Action: Chart reviewed    Response: No immediate Barriers to discharge identified at this time pending clinical progression.     Paulett Kaufhold, RN [employer] [facility]

## 2023-10-21 ENCOUNTER — Encounter (HOSPITAL_COMMUNITY): Payer: Self-pay | Admitting: Gastroenterology

## 2023-10-21 DIAGNOSIS — D649 Anemia, unspecified: Secondary | ICD-10-CM | POA: Diagnosis not present

## 2023-10-21 DIAGNOSIS — K922 Gastrointestinal hemorrhage, unspecified: Secondary | ICD-10-CM

## 2023-10-21 LAB — CBC WITH DIFFERENTIAL/PLATELET
Abs Immature Granulocytes: 0.01 K/uL (ref 0.00–0.07)
Basophils Absolute: 0 K/uL (ref 0.0–0.1)
Basophils Relative: 0 %
Eosinophils Absolute: 0.2 K/uL (ref 0.0–0.5)
Eosinophils Relative: 4 %
HCT: 24.3 % — ABNORMAL LOW (ref 36.0–46.0)
Hemoglobin: 7.3 g/dL — ABNORMAL LOW (ref 12.0–15.0)
Immature Granulocytes: 0 %
Lymphocytes Relative: 31 %
Lymphs Abs: 1.6 K/uL (ref 0.7–4.0)
MCH: 29.8 pg (ref 26.0–34.0)
MCHC: 30 g/dL (ref 30.0–36.0)
MCV: 99.2 fL (ref 80.0–100.0)
Monocytes Absolute: 0.5 K/uL (ref 0.1–1.0)
Monocytes Relative: 10 %
Neutro Abs: 2.8 K/uL (ref 1.7–7.7)
Neutrophils Relative %: 55 %
Platelets: 194 K/uL (ref 150–400)
RBC: 2.45 MIL/uL — ABNORMAL LOW (ref 3.87–5.11)
RDW: 18.9 % — ABNORMAL HIGH (ref 11.5–15.5)
WBC: 5.1 K/uL (ref 4.0–10.5)
nRBC: 0 % (ref 0.0–0.2)

## 2023-10-21 LAB — HEMOGLOBIN AND HEMATOCRIT, BLOOD
HCT: 24.4 % — ABNORMAL LOW (ref 36.0–46.0)
Hemoglobin: 7.4 g/dL — ABNORMAL LOW (ref 12.0–15.0)

## 2023-10-21 MED ORDER — PANTOPRAZOLE SODIUM 40 MG PO TBEC: 40.0000 mg | DELAYED_RELEASE_TABLET | Freq: Two times a day (BID) | ORAL | 0 refills | Status: DC

## 2023-10-21 NOTE — Anesthesia Postprocedure Evaluation (Signed)
 Anesthesia Post Note  Patient: Jenna Rivera  Procedure(s) Performed: EGD (ESOPHAGOGASTRODUODENOSCOPY) DILATION, ESOPHAGUS  Patient location during evaluation: Phase II Anesthesia Type: General Level of consciousness: awake Pain management: pain level controlled Vital Signs Assessment: post-procedure vital signs reviewed and stable Respiratory status: spontaneous breathing and respiratory function stable Cardiovascular status: blood pressure returned to baseline and stable Postop Assessment: no headache and no apparent nausea or vomiting Anesthetic complications: no Comments: Late entry   No notable events documented.   Last Vitals:  Vitals:   10/20/23 1320 10/20/23 1938  BP: (!) 128/56 (!) 140/50  Pulse: 69 69  Resp: 16 16  Temp: 37.2 C 36.9 C  SpO2: 97% 99%    Last Pain:  Vitals:   10/21/23 0728  TempSrc:   PainSc: 6                  Jenna Rivera

## 2023-10-21 NOTE — Progress Notes (Signed)
 Gastroenterology Progress Note   Referring Provider: No ref. provider found Primary Care Physician:  [name], FNP Primary Gastroenterologist:  Lamar HERO.Rourk, MD  Patient ID: Jenna Rivera; [id number]; 23-May-1960    Subjective   Doing well overall. Did not sleep much. Anxious to eat regular soft food. Did well with full liquids. No melena or brbpr. No hematemesis. Energy is improving.  Continues to have slight headache, likely secondary to elevated blood pressures.  Objective   Vital signs in last 24 hours Temp:  [98.5 F (36.9 C)] 98.5 F (36.9 C) (10/16 1938) Pulse Rate:  [69] 69 (10/16 1938) Resp:  [16] 16 (10/16 1938) BP: (140)/(50) 140/50 (10/16 1938) SpO2:  [99 %] 99 % (10/16 1938) Last BM Date : 10/19/23  Physical Exam General:   Alert and oriented, pleasant Head:  Normocephalic and atraumatic. Eyes:  No icterus, sclera clear. Conjuctiva pink.  Mouth:  Without lesions, mucosa pink and moist.  Neck:  Supple, without thyromegaly or masses.  Abdomen:  Bowel sounds present, soft, non-tender, non-distended. No HSM or hernias noted. No rebound or guarding. No masses appreciated  Msk:  Symmetrical without gross deformities. Normal posture. Pulses:  Normal pulses noted. Extremities:  Without clubbing or edema. Neurologic:  Alert and  oriented x4;  grossly normal neurologically. Skin:  Warm and dry, intact without significant lesions.  Psych:  Alert and cooperative. Normal mood and affect.  Intake/Output from previous day: 10/16 0701 - 10/17 0700 In: 200 [I.V.:200] Out: -  Intake/Output this shift: Total I/O In: 720 [P.O.:720] Out: -   Lab Results  Recent Labs    10/19/23 1726 10/19/23 2149 10/20/23 0407 10/21/23 0358 10/21/23 0918  WBC 5.6  --  6.3 5.1  --   HGB 5.7*   < > 7.6* 7.3* 7.4*  HCT 19.7*   < > 24.9* 24.3* 24.4*  PLT 227  --  207 194  --    < > = values in this interval not displayed.   BMET Recent Labs    10/19/23 1726  10/20/23 0407  NA 142 143  K 3.9 3.8  CL 107 109  CO2 27 27  GLUCOSE 104* 83  BUN 18 13  CREATININE 0.72 0.66  CALCIUM 9.1 8.7*   LFT Recent Labs    10/19/23 1726 10/20/23 0407  PROT 5.8* 5.3*  ALBUMIN 3.7 3.4*  AST 20 17  ALT 12 12  ALKPHOS 48 42  BILITOT <0.2 0.3   PT/INR Recent Labs    10/19/23 1823  LABPROT 12.3  INR 0.9   Hepatitis Panel No results for input(s): HEPBSAG, HCVAB, HEPAIGM, HEPBIGM in the last 72 hours.  Studies/Results CT Head Wo Contrast Result Date: 10/19/2023 CLINICAL DATA:  Headache, increasing frequency or severity. EXAM: CT HEAD WITHOUT CONTRAST TECHNIQUE: Contiguous axial images were obtained from the base of the skull through the vertex without intravenous contrast. RADIATION DOSE REDUCTION: This exam was performed according to the departmental dose-optimization program which includes automated exposure control, adjustment of the mA and/or kV according to patient size and/or use of iterative reconstruction technique. COMPARISON:  None Available. FINDINGS: Brain: There is no evidence of an acute infarct, intracranial hemorrhage, mass, midline shift, or extra-axial fluid collection. Cerebral volume is normal. The ventricles are normal in size. Vascular: Calcified atherosclerosis at the skull base. No hyperdense vessel. Skull: No fracture or suspicious lesion. Sinuses/Orbits: Visualized paranasal sinuses and mastoid air cells are clear. Unremarkable orbits. Other: None. IMPRESSION: Unremarkable CT appearance of the brain.  Electronically Signed   By: Dasie Hamburg M.D.   On: 10/19/2023 19:09   DG Chest Portable 1 View Result Date: 10/19/2023 CLINICAL DATA:  Worsening headache with recent acid reflux. EXAM: PORTABLE CHEST 1 VIEW COMPARISON:  July 03, 2018 FINDINGS: The heart size and mediastinal contours are within normal limits. The lungs are hyperinflated. Mild atelectatic changes are seen within the bilateral lung bases. No focal consolidation,  pleural effusion or pneumothorax is identified. The visualized skeletal structures are unremarkable. IMPRESSION: COPD with mild bibasilar atelectasis. Electronically Signed   By: Suzen Dials M.D.   On: 10/19/2023 18:53    Assessment  63 y.o. female with a history of HTN, HLD, COPD, GERD, sleep apnea, and osteoarthritis who presented to the ED with reports of severe headache, tinnitus, palpitations, and progressive generalized weakness and dyspnea on exertion.  Symptomatic anemia, GI bleed: Hemoglobin 13.3 in June, declined to 5.7 on admission.  Reports of melena for about a month.  Stool was heme positive in the ED.  MCV was initially elevated, B12  within normal limits.  Iron studies revealed iron deficiency with iron 25, saturation 7%, and low normal ferritin.  She received 2 units PRBCs with hemoglobin improved to 7.6, stable at 7.3 today.  Fatigue and shortness of breath improved although not back to baseline.  Outpatient continues to be on Mobic chronically and also takes ASA Packers occasionally.  Last colonoscopy in 2023, her 10-year repeat recommended.  EGD completed yesterday 10/20/2023 with evidence of active bleeding at the GE junction concerning for possible dieulafoy lesion which was injected and clipped.  Procedure was otherwise normal.  For treatment recommend pantoprazole 40 mg twice daily and complete avoidance of NSAIDs.   Given she is ready to go home when labs are stable she is okay to discharge from a GI standpoint.  We discussed needing to repeat CBC in 1 week, continuing PPI twice daily, avoiding NSAIDs, and follow-up office visit as previously scheduled.  I also discussed with her beginning a once daily iron supplement over-the-counter (ferrous sulfate 325 mg that contains 75 mg of elemental iron).  She is aware that she is to take this with vitamin C for absorption.  Dysphagia: No mention of dysphagia to me during examination prior to going home.  She has history of  esophageal dilation in 2021.  Previously reported recurrent solid food dysphagia with moderate symptoms.  No mention of dilation during EGD yesterday but also esophagus appearing normal other than lesion found at the GE junction.  Likely given active bleeding, empiric dilation was not performed.  Can rediscuss at follow-up if symptoms ongoing.  Plan / Recommendations  OV already scheduled for 11/17 per patient. Daily oral iron supplement Pantoprazole 40 mg BID recommended GERD diet CBC in 1 week Avoid NSAIDs Discussed likely recheck of iron levels in 1 month.     LOS: 0 days   10/21/2023, 1:29 PM   Charmaine Melia, MSN, FNP-BC, AGACNP-BC [facility]

## 2023-10-21 NOTE — Discharge Summary (Signed)
 Physician Discharge Summary  Jenna Rivera FMW:[id number] DOB: 1960-11-20 DOA: 10/19/2023  PCP: [name], FNP  Admit date: 10/19/2023 Discharge date: 10/21/2023  Admitted From: Home Disposition: Home  Recommendations for Outpatient Follow-up:  Follow up with PCP in 1 week with repeat Hemoglobin Follow up in ED if symptoms worsen or new appear Follow-up with GI as scheduled.   Home Health: No Equipment/Devices: None  Discharge Condition: Stable CODE STATUS: Full Diet recommendation: Heart healthy  Brief/Interim Summary:  Brief Narrative:  Jenna Rivera is a 63 y.o. female with medical history significant of hypertension, COPD, osteoarthritis, anxiety and obesity who presents to the emergency department due to headache, ringing in the ears which started on Friday (10/10) and about 1 month of generalized weakness and shortness of breath on exertion, has been taking meloxicam, occasional aspirin and has been having darker stools for past few weeks.   Vital signs stable in the ER.  Laboratory work is significantly abnormal with hemoglobin of 5.7 which was 13 in June 2025.  Patient initiated on IV Protonix and was transfused with 2 unit PRBC and admitted for further management.  EGD was completed on 10/20/2023.  FINDINGS: - Normal esophagus.  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  Clip (MR safe) was placed.  Injected.  - Normal stomach.  Biopsied.  - Normal examined duodenum.    Hemoglobin was repeated after blood transfusion and a day after endoscopy, hemoglobin remains stable at 7.6.   Her symptoms of generalized weakness, fatigue, headache, ringing in the ears are thought to be secondary to blood loss anemia.  She is recommended for Protonix twice daily for now and follow-up with GI.  She also need to continue multivitamin/iron with vitamin C.  She is advised not to take meloxicam, aspirin or any other  NSAIDs.  Chronic medications including COPD, remain stable.   Discharge Diagnoses:  Principal Problem:   GI bleed Active Problems:   COPD (chronic obstructive pulmonary disease) (HCC)   OSA on CPAP   Gastroesophageal reflux disease   Symptomatic anemia   Obesity, Class II, BMI 35-39.9   Essential hypertension   Osteoarthritis    Discharge Instructions  Discharge Instructions     Call MD for:  extreme fatigue   Complete by: As directed    Call MD for:  persistant dizziness or light-headedness   Complete by: As directed    Diet - low sodium heart healthy   Complete by: As directed    Discharge instructions   Complete by: As directed    1.  Call your provider(PCP/GI) or return to the ER if recurrence of dark stool/fresh blood in the stool 2.  Start taking Protonix 40 mg twice daily.  Follow-up with GI for pending pathology reports 3.  Stop taking aspirin, meloxicam or other NSAIDs.   Increase activity slowly   Complete by: As directed       Allergies as of 10/21/2023   No Known Allergies      Medication List     STOP taking these medications    [employer] 500-32.5 MG Tabs Generic drug: Aspirin-Caffeine   meloxicam 15 MG tablet Commonly known as: MOBIC   predniSONE  10 MG tablet Commonly known as: DELTASONE        TAKE these medications    albuterol  (2.5 MG/3ML) 0.083% nebulizer solution Commonly known as: PROVENTIL  Inhale 3 mLs into the lungs every 6 (six) hours as needed (wheezing/shortness of  breath.).   albuterol  108 (90 Base) MCG/ACT inhaler Commonly known as: VENTOLIN  HFA Inhale 1-2 puffs into the lungs every 6 (six) hours as needed for wheezing or shortness of breath.   calcium carbonate 500 MG chewable tablet Commonly known as: TUMS - dosed in mg elemental calcium Chew 1 tablet by mouth daily as needed for indigestion or heartburn.   dicyclomine  10 MG capsule Commonly known as: BENTYL  TAKE 1 CAPSULE BY MOUTH 4 TIMES A DAY BEFORE  MEALS AND AT BEDTIME   fluticasone 50 MCG/ACT nasal spray Commonly known as: FLONASE Place 2 sprays into both nostrils daily as needed for allergies.   furosemide 40 MG tablet Commonly known as: LASIX Take 40 mg by mouth daily as needed for fluid (fluid retention).   gabapentin 300 MG capsule Commonly known as: NEURONTIN Take 300-600 mg by mouth See admin instructions. Take 1 capsule (300 mg) by mouth in the morning & take 2 capsules (600 mg) by mouth at night   HYDROcodone-acetaminophen  7.5-325 MG tablet Commonly known as: NORCO Take 1 tablet by mouth every 6 (six) hours as needed (pain.).   hydrOXYzine 10 MG tablet Commonly known as: ATARAX Take 10 mg by mouth 3 (three) times daily as needed for itching.   latanoprost 0.005 % ophthalmic solution Commonly known as: XALATAN Place 1 drop into both eyes at bedtime.   levocetirizine 5 MG tablet Commonly known as: XYZAL Take 5 mg by mouth daily.   LORazepam 1 MG tablet Commonly known as: ATIVAN Take 1 mg by mouth 2 (two) times daily as needed for anxiety.   montelukast 10 MG tablet Commonly known as: SINGULAIR Take 10 mg by mouth at bedtime.   multivitamin with minerals Tabs tablet Take 1 tablet by mouth in the morning.   Ozempic (0.25 or 0.5 MG/DOSE) 2 MG/1.5ML Sopn Generic drug: Semaglutide(0.25 or 0.5MG /DOS) Inject 0.25 mg into the skin every Saturday.   pantoprazole 40 MG tablet Commonly known as: Protonix Take 1 tablet (40 mg total) by mouth 2 (two) times daily before a meal.   tiZANidine 4 MG tablet Commonly known as: ZANAFLEX Take 4 mg by mouth every 8 (eight) hours as needed for muscle spasms.   vitamin C 1000 MG tablet Take 1,000 mg by mouth in the morning.   Vitamin D3 50 MCG (2000 UT) Tabs Take 2,000 Units by mouth in the morning.        Follow-up Information     Eartha Angelia Sieving, MD. Call in 1 week(s).   Specialty: Gastroenterology Contact information: [address] [place] [address] [phone]         [name], FNP. Schedule an appointment as soon as possible for a visit in 1 week(s).   Specialty: Family Medicine Contact information: [address] [place] [address] [phone]                No Known Allergies  Consultations:    Procedures/Studies: CT Head Wo Contrast Result Date: 10/19/2023 CLINICAL DATA:  Headache, increasing frequency or severity. EXAM: CT HEAD WITHOUT CONTRAST TECHNIQUE: Contiguous axial images were obtained from the base of the skull through the vertex without intravenous contrast. RADIATION DOSE REDUCTION: This exam was performed according to the departmental dose-optimization program which includes automated exposure control, adjustment of the mA and/or kV according to patient size and/or use of iterative reconstruction technique. COMPARISON:  None Available. FINDINGS: Brain: There is no evidence of an acute infarct, intracranial hemorrhage, mass, midline shift,  or extra-axial fluid collection. Cerebral volume is normal. The ventricles are normal in size. Vascular: Calcified atherosclerosis at the skull base. No hyperdense vessel. Skull: No fracture or suspicious lesion. Sinuses/Orbits: Visualized paranasal sinuses and mastoid air cells are clear. Unremarkable orbits. Other: None. IMPRESSION: Unremarkable CT appearance of the brain. Electronically Signed   By: Dasie Hamburg M.D.   On: 10/19/2023 19:09   DG Chest Portable 1 View Result Date: 10/19/2023 CLINICAL DATA:  Worsening headache with recent acid reflux. EXAM: PORTABLE CHEST 1 VIEW COMPARISON:  July 03, 2018 FINDINGS: The heart size and mediastinal contours are within normal limits. The lungs are hyperinflated. Mild atelectatic changes are seen within the bilateral lung bases. No focal consolidation, pleural effusion or pneumothorax is identified. The visualized skeletal structures are unremarkable. IMPRESSION: COPD with mild bibasilar  atelectasis. Electronically Signed   By: Suzen Dials M.D.   On: 10/19/2023 18:53      Subjective:   Discharge Exam: Vitals:   10/20/23 1320 10/20/23 1938  BP: (!) 128/56 (!) 140/50  Pulse: 69 69  Resp: 16 16  Temp: 98.9 F (37.2 C) 98.5 F (36.9 C)  SpO2: 97% 99%    General: Pt is alert, awake, not in acute distress Cardiovascular: rate controlled, S1/S2 + Respiratory: bilateral decreased breath sounds at bases Abdominal: Soft, NT, ND, bowel sounds + Extremities: no edema, no cyanosis    The results of significant diagnostics from this hospitalization (including imaging, microbiology, ancillary and laboratory) are listed below for reference.     Microbiology: No results found for this or any previous visit (from the past 240 hours).   Labs: BNP (last 3 results) No results for input(s): BNP in the last 8760 hours. Basic Metabolic Panel: Recent Labs  Lab 10/19/23 1726 10/19/23 1823 10/20/23 0407  NA 142  --  143  K 3.9  --  3.8  CL 107  --  109  CO2 27  --  27  GLUCOSE 104*  --  83  BUN 18  --  13  CREATININE 0.72  --  0.66  CALCIUM 9.1  --  8.7*  MG  --  2.3  --   PHOS  --   --  3.4   Liver Function Tests: Recent Labs  Lab 10/19/23 1726 10/20/23 0407  AST 20 17  ALT 12 12  ALKPHOS 48 42  BILITOT <0.2 0.3  PROT 5.8* 5.3*  ALBUMIN 3.7 3.4*   No results for input(s): LIPASE, AMYLASE in the last 168 hours. No results for input(s): AMMONIA in the last 168 hours. CBC: Recent Labs  Lab 10/19/23 1726 10/19/23 2149 10/20/23 0407 10/21/23 0358 10/21/23 0918  WBC 5.6  --  6.3 5.1  --   NEUTROABS 3.3  --   --  2.8  --   HGB 5.7* 6.4* 7.6* 7.3* 7.4*  HCT 19.7* 21.3* 24.9* 24.3* 24.4*  MCV 111.3*  --  98.4 99.2  --   PLT 227  --  207 194  --    Cardiac Enzymes: No results for input(s): CKTOTAL, CKMB, CKMBINDEX, TROPONINI in the last 168 hours. BNP: Invalid input(s): POCBNP CBG: No results for input(s): GLUCAP in the  last 168 hours. D-Dimer No results for input(s): DDIMER in the last 72 hours. Hgb A1c No results for input(s): HGBA1C in the last 72 hours. Lipid Profile No results for input(s): CHOL, HDL, LDLCALC, TRIG, CHOLHDL, LDLDIRECT in the last 72 hours. Thyroid  function studies No results for input(s): TSH, T4TOTAL, T3FREE, THYROIDAB  in the last 72 hours.  Invalid input(s): FREET3 Anemia work up Recent Labs    10/20/23 0407 10/20/23 0536  VITAMINB12 693  --   FOLATE >20.0  --   FERRITIN  --  34  TIBC  --  329  IRON  --  25*   Urinalysis    Component Value Date/Time   COLORURINE COLORLESS (A) 10/19/2023 1910   APPEARANCEUR CLEAR 10/19/2023 1910   LABSPEC 1.003 (L) 10/19/2023 1910   PHURINE 6.0 10/19/2023 1910   GLUCOSEU NEGATIVE 10/19/2023 1910   HGBUR NEGATIVE 10/19/2023 1910   BILIRUBINUR NEGATIVE 10/19/2023 1910   KETONESUR NEGATIVE 10/19/2023 1910   PROTEINUR NEGATIVE 10/19/2023 1910   NITRITE NEGATIVE 10/19/2023 1910   LEUKOCYTESUR MODERATE (A) 10/19/2023 1910   Sepsis Labs Recent Labs  Lab 10/19/23 1726 10/20/23 0407 10/21/23 0358  WBC 5.6 6.3 5.1   Microbiology No results found for this or any previous visit (from the past 240 hours).   Time coordinating discharge: 35 minutes  SIGNED:   Neeya Prigmore, MD  [facility] 10/21/2023, 2:40 PM

## 2023-10-24 ENCOUNTER — Ambulatory Visit (INDEPENDENT_AMBULATORY_CARE_PROVIDER_SITE_OTHER): Payer: Self-pay | Admitting: Gastroenterology

## 2023-10-24 LAB — SURGICAL PATHOLOGY

## 2023-10-26 NOTE — Progress Notes (Signed)
 Patient result letter mailed

## 2023-10-27 DIAGNOSIS — K922 Gastrointestinal hemorrhage, unspecified: Secondary | ICD-10-CM | POA: Diagnosis not present

## 2023-11-02 ENCOUNTER — Telehealth: Payer: Self-pay | Admitting: Internal Medicine

## 2023-11-02 NOTE — Telephone Encounter (Signed)
 Pt came to front desk at [employer] office asking about labs. She has OV with [facility] in November. I told her I would have the nurse call [phone]

## 2023-11-02 NOTE — Telephone Encounter (Signed)
 L[facility] letting pt know that any needed labs will be ordered at the time of her ov.

## 2023-11-07 ENCOUNTER — Other Ambulatory Visit (HOSPITAL_BASED_OUTPATIENT_CLINIC_OR_DEPARTMENT_OTHER): Payer: Self-pay

## 2023-11-10 ENCOUNTER — Encounter (INDEPENDENT_AMBULATORY_CARE_PROVIDER_SITE_OTHER): Payer: Self-pay | Admitting: Gastroenterology

## 2023-11-15 DIAGNOSIS — I10 Essential (primary) hypertension: Secondary | ICD-10-CM | POA: Diagnosis not present

## 2023-11-15 DIAGNOSIS — M2550 Pain in unspecified joint: Secondary | ICD-10-CM | POA: Diagnosis not present

## 2023-11-15 DIAGNOSIS — Z79899 Other long term (current) drug therapy: Secondary | ICD-10-CM | POA: Diagnosis not present

## 2023-11-15 DIAGNOSIS — F1120 Opioid dependence, uncomplicated: Secondary | ICD-10-CM | POA: Diagnosis not present

## 2023-11-15 DIAGNOSIS — Z299 Encounter for prophylactic measures, unspecified: Secondary | ICD-10-CM | POA: Diagnosis not present

## 2023-11-21 ENCOUNTER — Encounter: Payer: Self-pay | Admitting: Gastroenterology

## 2023-11-21 ENCOUNTER — Ambulatory Visit: Admitting: Gastroenterology

## 2023-11-21 VITALS — BP 125/75 | HR 66 | Temp 98.0°F | Ht 61.0 in | Wt 212.6 lb

## 2023-11-21 DIAGNOSIS — K921 Melena: Secondary | ICD-10-CM

## 2023-11-21 DIAGNOSIS — D539 Nutritional anemia, unspecified: Secondary | ICD-10-CM | POA: Diagnosis not present

## 2023-11-21 MED ORDER — PANTOPRAZOLE SODIUM 40 MG PO TBEC
40.0000 mg | DELAYED_RELEASE_TABLET | Freq: Two times a day (BID) | ORAL | 1 refills | Status: AC
  Filled 2024-02-10: qty 180, 90d supply, fill #0

## 2023-11-21 NOTE — Progress Notes (Signed)
 GI Office Note    Referring Provider: [name], FNP Primary Care Physician:  [name], FNP  Primary Gastroenterologist: Ozell Hollingshead, MD   Chief Complaint   Chief Complaint  Patient presents with   Follow-up    History of Present Illness   Jenna Rivera is a 63 y.o. female presenting today for follow up. Last seen during hospitalization last month for profound macrocytic anemia, heme positive stool and reported melena for about a month. Chronically on Mobic. Occasionally took ASA powders and ibuprofen. No PPI. Also with mild esophageal dysphagia at that time but really no current symptoms.     Acute change in hgb from 13.3 to 5.7 since June 2025 to time of admission last month. She received 2 units of prbcs. At time of discharge on 10/21/23 was 7.4. Ferritin 34. Iron 25, iron sat 7. Folate >30. B12 693. Labs 10/27/23 withHgb 8.8.  Discussed the use of AI scribe software for clinical note transcription with the patient, who gave verbal consent to proceed.  History of Present Illness SEE BEHARRY is a 63 year old female with osteoarthritis, recent UGI bleed who presents with severe joint pain and swelling.  She experiences severe pain and swelling in her joints, particularly in her neck, knees, ankles, hips, and shoulders. The pain is described as feeling like she has been 'run over and beat', and it is so severe that she struggles to stand up and move, often feeling like she is 'walking like someone that's a hundred years old'. She has a history of gastrointestinal bleeding, which led to the discontinuation of meloxicam and her rare ibuprofen and aspirin and unfortunately she is having a hard time with regards to her joint pain.    Currently, her stools have been dark but not black, which she attributes to iron supplements. No recent black stools or diarrhea, and bowel movements are regular. She uses natural remedies such as a drink made with turmeric,  ginger, black pepper, and milk to aid digestion and bowel regularity.    Significant stress is present due to her husband's recent severe injuries, for which she is the primary caregiver. This stress, combined with her own health issues, has been overwhelming, leading to physical and emotional exhaustion.  She has been on and off Mobic for years, initially at a lower dose, and typically took her medications with food to minimize gastrointestinal side effects. Tylenol  is not helping with her joint pain. She has tried various topical regimens without relief. She inquires about possible restarting Mobic.        Prior Data     Results   EGD 10/20/23: -normal esophagus -active bleeding at GE junction, possible Dieulafoy lesion s/p clip and injection -normal stomach s/p bx, mild nonspecific reactive gastropathy, neg for h.pylori -normal duodenum  Colonoscopy 07/2021: -diverticulosis - non-bleeding internal hemorrhoids -s/p random colon biopsies, neg -next colonoscopy 10 years   EGD 08/2019: -normal esophagus s/p dilation -gastric erosions s/p bx neg h.pylori   Medications   Current Outpatient Medications  Medication Sig Dispense Refill   albuterol  (PROVENTIL ) (2.5 MG/3ML) 0.083% nebulizer solution Inhale 3 mLs into the lungs every 6 (six) hours as needed (wheezing/shortness of breath.).      albuterol  (VENTOLIN  HFA) 108 (90 Base) MCG/ACT inhaler Inhale 1-2 puffs into the lungs every 6 (six) hours as needed for wheezing or shortness of breath. 8 g 5   Ascorbic Acid (VITAMIN C) 1000 MG tablet Take 1,000 mg by  mouth in the morning.     calcium carbonate (TUMS - DOSED IN MG ELEMENTAL CALCIUM) 500 MG chewable tablet Chew 1 tablet by mouth daily as needed for indigestion or heartburn.     Cholecalciferol (VITAMIN D3) 50 MCG (2000 UT) TABS Take 2,000 Units by mouth in the morning.     dicyclomine  (BENTYL ) 10 MG capsule TAKE 1 CAPSULE BY MOUTH 4 TIMES A DAY BEFORE MEALS AND AT BEDTIME 120  capsule 0   fluticasone (FLONASE) 50 MCG/ACT nasal spray Place 2 sprays into both nostrils daily as needed for allergies.     furosemide (LASIX) 40 MG tablet Take 40 mg by mouth daily as needed for fluid (fluid retention).     gabapentin (NEURONTIN) 300 MG capsule Take 300-600 mg by mouth See admin instructions. Take 1 capsule (300 mg) by mouth in the morning & take 2 capsules (600 mg) by mouth at night     HYDROcodone-acetaminophen  (NORCO) 7.5-325 MG tablet Take 1 tablet by mouth every 6 (six) hours as needed (pain.).     hydrOXYzine (ATARAX) 10 MG tablet Take 10 mg by mouth 3 (three) times daily as needed for itching.     latanoprost (XALATAN) 0.005 % ophthalmic solution Place 1 drop into both eyes at bedtime.      levocetirizine (XYZAL) 5 MG tablet Take 5 mg by mouth daily.     LORazepam (ATIVAN) 1 MG tablet Take 1 mg by mouth 2 (two) times daily as needed for anxiety.     montelukast (SINGULAIR) 10 MG tablet Take 10 mg by mouth at bedtime.     Multiple Vitamin (MULTIVITAMIN WITH MINERALS) TABS tablet Take 1 tablet by mouth in the morning.     pantoprazole (PROTONIX) 40 MG tablet Take 1 tablet (40 mg total) by mouth 2 (two) times daily before a meal. 60 tablet 0   Semaglutide,0.25 or 0.5MG /DOS, (OZEMPIC, 0.25 OR 0.5 MG/DOSE,) 2 MG/1.5ML SOPN Inject 0.25 mg into the skin every Saturday.     tiZANidine (ZANAFLEX) 4 MG tablet Take 4 mg by mouth every 8 (eight) hours as needed for muscle spasms.     No current facility-administered medications for this visit.    Allergies   Allergies as of 11/21/2023   (No Known Allergies)       Review of Systems   General: Negative for anorexia, weight loss, fever, chills, fatigue, weakness. ENT: Negative for hoarseness, difficulty swallowing, nasal congestion. CV: Negative for chest pain, angina, palpitations, dyspnea on exertion, peripheral edema.  Respiratory: Negative for dyspnea at rest, dyspnea on exertion, cough, sputum, wheezing.  GI: See  history of present illness. GU:  Negative for dysuria, hematuria, urinary incontinence, urinary frequency, nocturnal urination.  Endo: Negative for unusual weight change.     Physical Exam   BP 125/75   Pulse 66   Temp 98 F (36.7 C) (Oral)   Ht 5' 1 (1.549 m)   Wt 212 lb 9.6 oz (96.4 kg)   SpO2 92%   BMI 40.17 kg/m    General: Well-nourished, well-developed in no acute distress.  Eyes: No icterus. Mouth: Oropharyngeal mucosa moist and pink   Abdomen: Bowel sounds are normal, nontender, nondistended, no hepatosplenomegaly or masses,  no abdominal bruits or hernia , no rebound or guarding.  Rectal: not performed Extremities: 1+ bilateral lower extremity edema. No clubbing or deformities. Neuro: Alert and oriented x 4   Skin: Warm and dry, no jaundice.   Psych: Alert and cooperative, normal mood and affect.  Labs  Lab Results  Component Value Date   NA 143 10/20/2023   CL 109 10/20/2023   K 3.8 10/20/2023   CO2 27 10/20/2023   BUN 13 10/20/2023   CREATININE 0.66 10/20/2023   GFRNONAA >60 10/20/2023   CALCIUM 8.7 (L) 10/20/2023   PHOS 3.4 10/20/2023   ALBUMIN 3.4 (L) 10/20/2023   GLUCOSE 83 10/20/2023   Lab Results  Component Value Date   ALT 12 10/20/2023   AST 17 10/20/2023   ALKPHOS 42 10/20/2023   BILITOT 0.3 10/20/2023   Lab Results  Component Value Date   WBC 5.1 10/21/2023   HGB 7.4 (L) 10/21/2023   HCT 24.4 (L) 10/21/2023   MCV 99.2 10/21/2023   PLT 194 10/21/2023   Lab Results  Component Value Date   IRON 25 (L) 10/20/2023   TIBC 329 10/20/2023   FERRITIN 34 10/20/2023   Lab Results  Component Value Date   VITAMINB12 693 10/20/2023   Lab Results  Component Value Date   FOLATE >20.0 10/20/2023    Imaging Studies   No results found.  Assessment/Plan:    Assessment & Plan  History of gastrointestinal bleeding/IDA: Gastrointestinal bleeding from GE junction ?Dieulafoy lesion s/p clipping. She has gastritis (neg h.pylori). was on  mobic daily and occasional asa powder or ibuprofen and no PPI. She is now suffering from joint/muscle pain and stiffness since stopping mobic after recent admission. She inquires about possibility of restarting low dose on concomitant PPI due to her debilitating joint pain.  - Continue pantoprazole 40mg  twice daily before meals. - update labs.  - based on findings, will consider low dose mobic with ongoing PPI      Brantlee Penn S. Ezzard, MHS, PA-C [facility]

## 2023-11-21 NOTE — Patient Instructions (Signed)
 Complete labs today at [facility], [address]   Continue pantoprazole 40mg  twice daily before breakfast and supper. RX sent to the pharmacy.

## 2023-11-22 LAB — CBC WITH DIFFERENTIAL/PLATELET
Basophils Absolute: 0.1 x10E3/uL (ref 0.0–0.2)
Basos: 1 %
EOS (ABSOLUTE): 0.2 x10E3/uL (ref 0.0–0.4)
Eos: 3 %
Hematocrit: 40 % (ref 34.0–46.6)
Hemoglobin: 12.7 g/dL (ref 11.1–15.9)
Immature Grans (Abs): 0 x10E3/uL (ref 0.0–0.1)
Immature Granulocytes: 0 %
Lymphocytes Absolute: 2.2 x10E3/uL (ref 0.7–3.1)
Lymphs: 32 %
MCH: 30 pg (ref 26.6–33.0)
MCHC: 31.8 g/dL (ref 31.5–35.7)
MCV: 95 fL (ref 79–97)
Monocytes Absolute: 0.7 x10E3/uL (ref 0.1–0.9)
Monocytes: 10 %
Neutrophils Absolute: 3.7 x10E3/uL (ref 1.4–7.0)
Neutrophils: 54 %
Platelets: 240 x10E3/uL (ref 150–450)
RBC: 4.23 x10E6/uL (ref 3.77–5.28)
RDW: 13 % (ref 11.7–15.4)
WBC: 6.9 x10E3/uL (ref 3.4–10.8)

## 2023-11-22 LAB — IRON,TIBC AND FERRITIN PANEL
Ferritin: 62 ng/mL (ref 15–150)
Iron Saturation: 11 % — ABNORMAL LOW (ref 15–55)
Iron: 34 ug/dL (ref 27–139)
Total Iron Binding Capacity: 305 ug/dL (ref 250–450)
UIBC: 271 ug/dL (ref 118–369)

## 2023-11-25 ENCOUNTER — Ambulatory Visit: Payer: Self-pay | Admitting: Gastroenterology

## 2023-11-25 DIAGNOSIS — D539 Nutritional anemia, unspecified: Secondary | ICD-10-CM

## 2023-11-28 NOTE — Telephone Encounter (Signed)
 Tammy, Please let patient know that Dr. Shaaron said that he agreed that the esophageal site of bleeding did not look like an ulcer. He recommends continue pantoprazole  twice daily.   Ok to restart meloxicam.  Monitor for any signs of bleeding, if recurs, then will need to stop meloxicam. Do not take other nsaids such as ibuprofen or naproxyn.   We will need to recheck her cbc, iron/tibc.ferritin in six weeks, please arrange.

## 2023-11-29 ENCOUNTER — Ambulatory Visit (HOSPITAL_BASED_OUTPATIENT_CLINIC_OR_DEPARTMENT_OTHER)

## 2023-11-29 ENCOUNTER — Ambulatory Visit (HOSPITAL_BASED_OUTPATIENT_CLINIC_OR_DEPARTMENT_OTHER): Admitting: Pulmonary Disease

## 2023-11-29 ENCOUNTER — Encounter (HOSPITAL_BASED_OUTPATIENT_CLINIC_OR_DEPARTMENT_OTHER): Payer: Self-pay | Admitting: Pulmonary Disease

## 2023-11-29 VITALS — BP 168/51 | HR 63 | Temp 98.6°F | Ht 61.0 in | Wt 208.8 lb

## 2023-11-29 DIAGNOSIS — J4489 Other specified chronic obstructive pulmonary disease: Secondary | ICD-10-CM

## 2023-11-29 DIAGNOSIS — Z6839 Body mass index (BMI) 39.0-39.9, adult: Secondary | ICD-10-CM

## 2023-11-29 DIAGNOSIS — E669 Obesity, unspecified: Secondary | ICD-10-CM

## 2023-11-29 LAB — PULMONARY FUNCTION TEST
DL/VA % pred: 118 %
DL/VA: 5.08 ml/min/mmHg/L
DLCO cor % pred: 119 %
DLCO cor: 20.68 ml/min/mmHg
DLCO unc % pred: 117 %
DLCO unc: 20.22 ml/min/mmHg
FEF 25-75 Post: 1.17 L/s
FEF 25-75 Pre: 0.9 L/s
FEF2575-%Change-Post: 28 %
FEF2575-%Pred-Post: 61 %
FEF2575-%Pred-Pre: 47 %
FEV1-%Change-Post: 8 %
FEV1-%Pred-Post: 83 %
FEV1-%Pred-Pre: 77 %
FEV1-Post: 1.71 L
FEV1-Pre: 1.59 L
FEV1FVC-%Change-Post: 5 %
FEV1FVC-%Pred-Pre: 84 %
FEV6-%Change-Post: 2 %
FEV6-%Pred-Post: 97 %
FEV6-%Pred-Pre: 94 %
FEV6-Post: 2.5 L
FEV6-Pre: 2.43 L
FEV6FVC-%Change-Post: 0 %
FEV6FVC-%Pred-Post: 104 %
FEV6FVC-%Pred-Pre: 104 %
FVC-%Change-Post: 2 %
FVC-%Pred-Post: 93 %
FVC-%Pred-Pre: 90 %
FVC-Post: 2.5 L
FVC-Pre: 2.43 L
Post FEV1/FVC ratio: 69 %
Post FEV6/FVC ratio: 100 %
Pre FEV1/FVC ratio: 65 %
Pre FEV6/FVC Ratio: 100 %
RV % pred: 72 %
RV: 1.38 L
TLC % pred: 92 %
TLC: 4.12 L

## 2023-11-29 NOTE — Patient Instructions (Signed)
 Full PFT performed today.

## 2023-11-29 NOTE — Progress Notes (Signed)
 Full PFT performed today.

## 2023-11-29 NOTE — Patient Instructions (Addendum)
 COPD-asthma overlap - asymptomatic --CONTINUE albuterol  AS NEEDED for shortness of breath or wheezing. ORDERED --Daytime oxygen only needed for <88%. Currently not needing O2. Owns POC at home --Reviewed pulmonary function tests. Stable with mild obstruction  Discussed whether to restart inhalers. Will hold due to stability in PFTs and asymptomatic for now --Up to date on influenza and pneumococcal. Patient will verify when last pneumococcal given  Obesity --Continue working towards your weight loss goals --Encourage chair exercises 15 min daily

## 2023-11-29 NOTE — Progress Notes (Addendum)
 Subjective:   PATIENT ID: Jenna Rivera GENDER: female DOB: 1960-05-22, MRN: [id number]  Chief Complaint  Patient presents with   COPD    Reason for Visit: SOB  [name] is a 63 year old female former smoker with COPD, chronic diarrhea, HTN, OSA on CPAP who presents for follow-up  Initial consult She previously was seen by Dr. Darlean with last visit 10/12/2018. She was diagnosed with COPD in 2015 requiring hospitalization, no vent. She is currently on Breo and uses albuterol  3 times a week. Needs rescue with activity with wheezing and cough. She reports shortness of breath triggered by extremes of hot and cold, dust and illness. Has chronic sinus drainage. May have had childhood asthma. Her last exacerbation 12/2021. She has intentionally lost over 100 lbs over the last two years. After weight her breathing has improved and has reduced her exacerbations to 1-2 times a year vs monthly. She is compliant with CPAP which helps with her quality of sleep. Has oxygen.  09/17/22 Since our last visit she and her husband had covid three weeks ago. She reports occasional shortness of breath and coughing when triggered by certain smells. Denies wheezing. Has been compliant with Breo and feels it has made a difference in her overall breathing. Rarely uses albuterol . Checks oxygen mid-90s. Does not use her oxygen anymore.  04/28/23 Since our last visit she reports she is overall doing well. She is taking Breo twice a week. Denies shortness of breath, cough or wheezing. No limitations in activity including walking upstairs. Denies exacerbations since our last visit. Does get triggered by strong odors, smoke and illness.  11/29/23 Since our last visit she had pneumonia in June 2025 and GI bleed 2/2 localized bleed at GE junction s/p clip in October 2025. Hemoglobin is stable with 12.7 on 11/21/23. Denies shortness of breath, cough and wheezing. Has been off Breo for 3-4 months. Using albuterol   every 2 weeks for shortness of breath.   Social History: Quit smoking in 1993. 1 ppd x 20 years Disabled  Half sister with Jenna Rivera, another patient  Past Medical History:  Diagnosis Date   Acute bronchitis with COPD (HCC)    Allergic rhinitis    Anxiety    Asthma    Cervical disc herniation    Chest pain    Chronic pain    COPD (chronic obstructive pulmonary disease) (HCC)    Edema    GERD (gastroesophageal reflux disease)    HTN (hypertension)    Hyperlipidemia    Lumbago    Neck pain    Osteoporosis    Rosacea    Sleep apnea    uses CPAP     Family History  Problem Relation Age of Onset   COPD Mother    Cancer Father    Breast cancer Sister    Lung cancer Sister    Multiple sclerosis Sister    Colon cancer Neg Hx        family history is limited.      Social History   Occupational History   Not on file  Tobacco Use   Smoking status: Former    Current packs/day: 0.00    Average packs/day: 1 pack/day for 15.0 years (15.0 ttl pk-yrs)    Types: Cigarettes    Start date: 01/05/1976    Quit date: 01/05/1991    Years since quitting: 32.9    Passive exposure: Past   Smokeless tobacco: Never  Vaping Use  Vaping status: Never Used  Substance and Sexual Activity   Alcohol  use: Yes    Comment: occasional   Drug use: Never   Sexual activity: Not Currently    No Known Allergies   Outpatient Medications Prior to Visit  Medication Sig Dispense Refill   albuterol  (PROVENTIL ) (2.5 MG/3ML) 0.083% nebulizer solution Inhale 3 mLs into the lungs every 6 (six) hours as needed (wheezing/shortness of breath.).      albuterol  (VENTOLIN  HFA) 108 (90 Base) MCG/ACT inhaler Inhale 1-2 puffs into the lungs every 6 (six) hours as needed for wheezing or shortness of breath. 8 g 5   Ascorbic Acid (VITAMIN C) 1000 MG tablet Take 1,000 mg by mouth in the morning.     calcium carbonate (TUMS - DOSED IN MG ELEMENTAL CALCIUM) 500 MG chewable tablet Chew 1 tablet by mouth  daily as needed for indigestion or heartburn.     Cholecalciferol (VITAMIN D3) 50 MCG (2000 UT) TABS Take 2,000 Units by mouth in the morning.     dicyclomine  (BENTYL ) 10 MG capsule TAKE 1 CAPSULE BY MOUTH 4 TIMES A DAY BEFORE MEALS AND AT BEDTIME 120 capsule 0   fluticasone  (FLONASE ) 50 MCG/ACT nasal spray Place 2 sprays into both nostrils daily as needed for allergies.     furosemide (LASIX) 40 MG tablet Take 40 mg by mouth daily as needed for fluid (fluid retention).     gabapentin (NEURONTIN) 300 MG capsule Take 300-600 mg by mouth See admin instructions. Take 1 capsule (300 mg) by mouth in the morning & take 2 capsules (600 mg) by mouth at night     HYDROcodone-acetaminophen  (NORCO) 7.5-325 MG tablet Take 1 tablet by mouth every 6 (six) hours as needed (pain.).     hydrOXYzine (ATARAX) 10 MG tablet Take 10 mg by mouth 3 (three) times daily as needed for itching.     latanoprost  (XALATAN ) 0.005 % ophthalmic solution Place 1 drop into both eyes at bedtime.      levocetirizine (XYZAL) 5 MG tablet Take 5 mg by mouth daily.     LORazepam (ATIVAN) 1 MG tablet Take 1 mg by mouth 2 (two) times daily as needed for anxiety.     montelukast  (SINGULAIR ) 10 MG tablet Take 10 mg by mouth at bedtime.     Multiple Vitamin (MULTIVITAMIN WITH MINERALS) TABS tablet Take 1 tablet by mouth in the morning.     pantoprazole  (PROTONIX ) 40 MG tablet Take 1 tablet (40 mg total) by mouth 2 (two) times daily before a meal. 180 tablet 1   Semaglutide,0.25 or 0.5MG /DOS, (OZEMPIC, 0.25 OR 0.5 MG/DOSE,) 2 MG/1.5ML SOPN Inject 0.25 mg into the skin every Saturday.     tiZANidine (ZANAFLEX) 4 MG tablet Take 4 mg by mouth every 8 (eight) hours as needed for muscle spasms.     No facility-administered medications prior to visit.    Review of Systems  Constitutional:  Negative for chills, diaphoresis, fever, malaise/fatigue and weight loss.  HENT:  Negative for congestion.   Respiratory:  Negative for cough, hemoptysis,  sputum production, shortness of breath and wheezing.   Cardiovascular:  Negative for chest pain, palpitations and leg swelling.     Objective:   Vitals:   11/29/23 1412  BP: (!) 168/51  Pulse: 63  Temp: 98.6 F (37 C)  SpO2: 94%  Weight: 208 lb 12.8 oz (94.7 kg)  Height: 5' 1 (1.549 m)   SpO2: 94 %  Physical Exam: General: Well-appearing, no acute distress HENT: [place],  AT Eyes: EOMI, no scleral icterus Respiratory: Clear to auscultation bilaterally.  No crackles, wheezing or rales Cardiovascular: RRR, -M/R/G, no JVD Extremities:-Edema,-tenderness Neuro: AAO x4, CNII-XII grossly intact Psych: Normal mood, normal affect   Data Reviewed:  Imaging: CXR 07/03/18 - Hyperinflated  PFT: 09/17/22 FVC 2.3 (83%) FEV1 1.5 (71%) Ratio 62  TLC 118% DLCO 137%.  Interpretation: Mild obstructive defect with air trapping and increased DLCO consistent with asthma  11/29/23 FVC 2.50 (93%) FEV1 1.71 (83%) Ratio 69  TLC 92% DLCO 117% Interpretation: Mild obstructive defect   Labs: CBC    Component Value Date/Time   WBC 6.9 11/21/2023 1635   WBC 5.1 10/21/2023 0358   RBC 4.23 11/21/2023 1635   RBC 2.45 (L) 10/21/2023 0358   HGB 12.7 11/21/2023 1635   HCT 40.0 11/21/2023 1635   PLT 240 11/21/2023 1635   MCV 95 11/21/2023 1635   MCH 30.0 11/21/2023 1635   MCH 29.8 10/21/2023 0358   MCHC 31.8 11/21/2023 1635   MCHC 30.0 10/21/2023 0358   RDW 13.0 11/21/2023 1635   LYMPHSABS 2.2 11/21/2023 1635   MONOABS 0.5 10/21/2023 0358   EOSABS 0.2 11/21/2023 1635   BASOSABS 0.1 11/21/2023 1635        Assessment & Plan:   Discussion: 63 year old female former smoker with COPD, chronic diarrhea, HTN, OSA on CPAP who presents for follow-up. PFTs consistent with COPD-asthma overlap. Well controlled off inhalers. Discussed clinical course and management of COPD including bronchodilator regimen, preventive care including vaccinations and action plan for exacerbation.  COPD-asthma overlap -  asymptomatic --Off maintenance inhalers --CONTINUE albuterol  AS NEEDED for shortness of breath or wheezing. ORDERED --Daytime oxygen only needed for <88%. Currently not needing O2. Owns POC at home --Reviewed pulmonary function tests. Stable with mild obstruction  Discussed whether to restart inhalers. Will hold due to stability in PFTs and asymptomatic for now --Up to date on influenza and pneumococcal. Patient will verify when last pneumococcal given  Obesity --Continue working towards your weight loss goals --Encourage chair exercises 15 min daily   Health Maintenance Immunization History  Administered Date(s) Administered   Influenza,inj,quad, With Preservative 09/08/2018   Moderna Sars-Covid-2 Vaccination 03/08/2019, 04/10/2019   Tdap 03/09/2018  Pneumoccoal 11/14/20 - Due in 2027  CT Lung Screen - not qualified. Quit in 1993  No orders of the defined types were placed in this encounter.  No orders of the defined types were placed in this encounter.   Return in about 1 year (around 11/28/2024).  I have spent a total time of 31-minutes on the day of the appointment including chart review, data review, collecting history, coordinating care and discussing medical diagnosis and plan with the patient/family. Past medical history, allergies, medications were reviewed. Pertinent imaging, labs and tests included in this note have been reviewed and interpreted independently by me.  Kae Lauman Slater Staff, MD [facility] 11/29/2023 2:37 PM

## 2023-11-29 NOTE — Addendum Note (Signed)
 Addended by: WELLINGTON MILLING on: 11/29/2023 10:19 AM   Modules accepted: Orders

## 2023-12-07 DIAGNOSIS — M25511 Pain in right shoulder: Secondary | ICD-10-CM | POA: Diagnosis not present

## 2023-12-07 DIAGNOSIS — M19011 Primary osteoarthritis, right shoulder: Secondary | ICD-10-CM | POA: Diagnosis not present

## 2023-12-07 DIAGNOSIS — M75101 Unspecified rotator cuff tear or rupture of right shoulder, not specified as traumatic: Secondary | ICD-10-CM | POA: Diagnosis not present

## 2023-12-07 DIAGNOSIS — M19012 Primary osteoarthritis, left shoulder: Secondary | ICD-10-CM | POA: Diagnosis not present

## 2023-12-07 DIAGNOSIS — G8929 Other chronic pain: Secondary | ICD-10-CM | POA: Diagnosis not present

## 2023-12-07 DIAGNOSIS — M7531 Calcific tendinitis of right shoulder: Secondary | ICD-10-CM | POA: Diagnosis not present

## 2023-12-07 DIAGNOSIS — M25512 Pain in left shoulder: Secondary | ICD-10-CM | POA: Diagnosis not present

## 2023-12-07 DIAGNOSIS — M75102 Unspecified rotator cuff tear or rupture of left shoulder, not specified as traumatic: Secondary | ICD-10-CM | POA: Diagnosis not present

## 2023-12-22 ENCOUNTER — Other Ambulatory Visit: Payer: Self-pay

## 2023-12-22 DIAGNOSIS — D539 Nutritional anemia, unspecified: Secondary | ICD-10-CM

## 2024-01-13 ENCOUNTER — Other Ambulatory Visit (HOSPITAL_BASED_OUTPATIENT_CLINIC_OR_DEPARTMENT_OTHER): Payer: Self-pay

## 2024-01-13 MED ORDER — MONTELUKAST SODIUM 10 MG PO TABS: 10.0000 mg | ORAL_TABLET | Freq: Every day | ORAL | 3 refills | Status: AC

## 2024-01-13 MED ORDER — ALBUTEROL SULFATE HFA 108 (90 BASE) MCG/ACT IN AERS: INHALATION_SPRAY | RESPIRATORY_TRACT | 5 refills | Status: AC

## 2024-01-13 MED ORDER — DICYCLOMINE HCL 10 MG PO CAPS: 10.0000 mg | ORAL_CAPSULE | Freq: Four times a day (QID) | ORAL | 2 refills | Status: AC | PRN

## 2024-01-13 MED ORDER — HYDROCODONE-ACETAMINOPHEN 7.5-325 MG PO TABS
1.0000 | ORAL_TABLET | Freq: Four times a day (QID) | ORAL | 0 refills | Status: AC | PRN
  Filled 2024-01-13: qty 120, 30d supply, fill #0

## 2024-01-13 MED ORDER — ZEPBOUND 2.5 MG/0.5ML ~~LOC~~ SOAJ
2.5000 mg | SUBCUTANEOUS | 3 refills | Status: AC
  Filled 2024-01-13 (×2): qty 2, 28d supply, fill #0

## 2024-01-13 MED ORDER — ALBUTEROL SULFATE (2.5 MG/3ML) 0.083% IN NEBU: INHALATION_SOLUTION | RESPIRATORY_TRACT | 4 refills | Status: AC

## 2024-01-13 MED ORDER — TIZANIDINE HCL 4 MG PO TABS: 4.0000 mg | ORAL_TABLET | Freq: Three times a day (TID) | ORAL | 3 refills | Status: AC | PRN

## 2024-01-13 MED ORDER — MELOXICAM 15 MG PO TABS: 15.0000 mg | ORAL_TABLET | Freq: Every day | ORAL | 3 refills | Status: AC

## 2024-01-17 ENCOUNTER — Other Ambulatory Visit (HOSPITAL_BASED_OUTPATIENT_CLINIC_OR_DEPARTMENT_OTHER): Payer: Self-pay

## 2024-01-18 ENCOUNTER — Other Ambulatory Visit (HOSPITAL_BASED_OUTPATIENT_CLINIC_OR_DEPARTMENT_OTHER): Payer: Self-pay

## 2024-01-19 ENCOUNTER — Other Ambulatory Visit (HOSPITAL_BASED_OUTPATIENT_CLINIC_OR_DEPARTMENT_OTHER): Payer: Self-pay

## 2024-01-19 MED ORDER — HYDROXYZINE HCL 25 MG PO TABS
25.0000 mg | ORAL_TABLET | Freq: Three times a day (TID) | ORAL | 1 refills | Status: AC | PRN
  Filled 2024-01-19: qty 90, 30d supply, fill #0

## 2024-01-20 ENCOUNTER — Other Ambulatory Visit (HOSPITAL_BASED_OUTPATIENT_CLINIC_OR_DEPARTMENT_OTHER): Payer: Self-pay

## 2024-01-30 ENCOUNTER — Other Ambulatory Visit (HOSPITAL_BASED_OUTPATIENT_CLINIC_OR_DEPARTMENT_OTHER): Payer: Self-pay

## 2024-01-30 MED ORDER — GABAPENTIN 300 MG PO CAPS
ORAL_CAPSULE | ORAL | 2 refills | Status: AC
  Filled 2024-01-30: qty 90, 30d supply, fill #0

## 2024-02-10 ENCOUNTER — Other Ambulatory Visit (HOSPITAL_BASED_OUTPATIENT_CLINIC_OR_DEPARTMENT_OTHER): Payer: Self-pay
